# Patient Record
Sex: Female | Born: 1965 | Race: Black or African American | Hispanic: No | Marital: Married | Smoking: Never smoker
Health system: Southern US, Community
[De-identification: ages and names within clinical notes are randomized; demographics above are authoritative.]

## PROBLEM LIST (undated history)

## (undated) DIAGNOSIS — E785 Hyperlipidemia, unspecified: Secondary | ICD-10-CM

## (undated) DIAGNOSIS — I10 Essential (primary) hypertension: Secondary | ICD-10-CM

## (undated) DIAGNOSIS — M1990 Unspecified osteoarthritis, unspecified site: Secondary | ICD-10-CM

## (undated) DIAGNOSIS — L509 Urticaria, unspecified: Secondary | ICD-10-CM

## (undated) DIAGNOSIS — K5090 Crohn's disease, unspecified, without complications: Secondary | ICD-10-CM

## (undated) HISTORY — DX: Hyperlipidemia, unspecified: E78.5

## (undated) HISTORY — DX: Urticaria, unspecified: L50.9

## (undated) HISTORY — DX: Essential (primary) hypertension: I10

## (undated) HISTORY — DX: Unspecified osteoarthritis, unspecified site: M19.90

## (undated) HISTORY — PX: CARPAL TUNNEL RELEASE: SHX101

---

## 2005-01-27 HISTORY — PX: BREAST SURGERY: SHX581

## 2006-01-27 HISTORY — PX: SMALL INTESTINE SURGERY: SHX150

## 2006-01-27 HISTORY — PX: TOTAL ABDOMINAL HYSTERECTOMY: SHX209

## 2006-01-27 HISTORY — PX: ABDOMINAL HYSTERECTOMY: SHX81

## 2012-01-19 ENCOUNTER — Ambulatory Visit (INDEPENDENT_AMBULATORY_CARE_PROVIDER_SITE_OTHER): Payer: BC Managed Care – PPO | Admitting: Physician Assistant

## 2012-01-19 VITALS — BP 152/91 | HR 102 | Temp 101.0°F | Resp 18 | Ht 64.0 in | Wt 181.0 lb

## 2012-01-19 DIAGNOSIS — J101 Influenza due to other identified influenza virus with other respiratory manifestations: Secondary | ICD-10-CM

## 2012-01-19 DIAGNOSIS — J111 Influenza due to unidentified influenza virus with other respiratory manifestations: Secondary | ICD-10-CM

## 2012-01-19 DIAGNOSIS — R6889 Other general symptoms and signs: Secondary | ICD-10-CM

## 2012-01-19 LAB — POCT INFLUENZA A/B
Influenza A, POC: POSITIVE
Influenza B, POC: NEGATIVE

## 2012-01-19 MED ORDER — OSELTAMIVIR PHOSPHATE 75 MG PO CAPS: 75.0000 mg | ORAL_CAPSULE | Freq: Two times a day (BID) | ORAL | Status: DC

## 2012-01-19 MED ORDER — HYDROCODONE-HOMATROPINE 5-1.5 MG/5ML PO SYRP: 5.0000 mL | ORAL_SOLUTION | Freq: Three times a day (TID) | ORAL | Status: DC | PRN

## 2012-01-19 NOTE — Progress Notes (Signed)
  Subjective:    Patient ID: Andrea Wilkins, female    DOB: November 23, 1965, 46 y.o.   MRN: [id number]  HPI   [name] is a 46 yr old female here with flu like symptoms that began yesterday morning.  She states her whole body aches.  Has a "terrible headache", feels like her "head is about to explode".  Ears are popping.  She is coughing, has chest pain with cough.  Tmax yesterday was 103F.  Has some runny nose and sore throat as well.  Some diarrhea, no nausea or vomiting.  Appetite is down.  No sick contacts that she is aware of.  Has not had a flu shot.  Using Nyquil cold and flu for symptoms which helps a little.     Review of Systems  Constitutional: Negative for fever and chills.  HENT: Positive for ear pain, sore throat and rhinorrhea.   Respiratory: Positive for cough. Negative for shortness of breath and wheezing.   Cardiovascular: Positive for chest pain (with cough).  Gastrointestinal: Positive for diarrhea. Negative for nausea, vomiting and abdominal pain.  Musculoskeletal: Positive for myalgias and arthralgias.  Skin: Negative.   Neurological: Positive for headaches.       Objective:   Physical Exam  Vitals reviewed. Constitutional: She is oriented to person, place, and time. She appears well-developed and well-nourished. She appears ill. No distress.  HENT:  Head: Normocephalic and atraumatic.  Right Ear: Ear canal normal. Tympanic membrane is injected.  Left Ear: Ear canal normal. Tympanic membrane is injected.  Nose: Mucosal edema present. Right sinus exhibits maxillary sinus tenderness and frontal sinus tenderness. Left sinus exhibits maxillary sinus tenderness and frontal sinus tenderness.  Mouth/Throat: Uvula is midline and mucous membranes are normal. Posterior oropharyngeal erythema present. No oropharyngeal exudate or posterior oropharyngeal edema.  Eyes: Conjunctivae normal are normal. No scleral icterus.  Neck: Neck supple.  Cardiovascular: Normal rate, regular rhythm  and normal heart sounds.  Exam reveals no gallop and no friction rub.   No murmur heard. Pulmonary/Chest: Effort normal and breath sounds normal. She has no wheezes. She has no rales.  Abdominal: Soft. Bowel sounds are normal.  Lymphadenopathy:    She has no cervical adenopathy.  Neurological: She is alert and oriented to person, place, and time.  Skin: Skin is warm and dry.  Psychiatric: She has a normal mood and affect. Her behavior is normal.      Filed Vitals:   01/19/12 1404  BP: 152/91  Pulse: 102  Temp: 101 F (38.3 C)  Resp: 18       Results for orders placed in visit on 01/19/12  POCT INFLUENZA A/B      Component Value Range   Influenza A, POC Positive     Influenza B, POC Negative         Assessment & Plan:   1. Influenza A  HYDROcodone-homatropine (HYCODAN) 5-1.5 MG/5ML syrup, oseltamivir (TAMIFLU) 75 MG capsule  2. Flu-like symptoms  POCT Influenza A/B     [name] is a 46 yr old female with influenza A.  Symptoms began about 30 hours ago, will treat with Tamiflu x 5 days.  Hycodan for cough at night.  Encouraged Tylenol/Advil for fever/pain relief.  Plenty of fluids and rest.  Patient will remain out of work for the next 3 days, note given.  Pt will call or RTC if worsening or not improving.

## 2012-01-19 NOTE — Patient Instructions (Signed)
Take the Tamiflu as directed.  Be sure to finish the full course.  Use the Hycodan for cough at night if needed.  Advil or Tylenol for fever reduction/pain relief.  Plenty of fluids and rest.  Stay out of work for the next several days.     Influenza, Adult Influenza ("the flu") is a viral infection of the respiratory tract. It occurs more often in winter months because people spend more time in close contact with one another. Influenza can make you feel very sick. Influenza easily spreads from person to person (contagious). CAUSES  Influenza is caused by a virus that infects the respiratory tract. You can catch the virus by breathing in droplets from an infected person's cough or sneeze. You can also catch the virus by touching something that was recently contaminated with the virus and then touching your mouth, nose, or eyes. SYMPTOMS  Symptoms typically last 4 to 10 days and may include:  Fever.  Chills.  Headache, body aches, and muscle aches.  Sore throat.  Chest discomfort and cough.  Poor appetite.  Weakness or feeling tired.  Dizziness.  Nausea or vomiting. DIAGNOSIS  Diagnosis of influenza is often made based on your history and a physical exam. A nose or throat swab test can be done to confirm the diagnosis. RISKS AND COMPLICATIONS You may be at risk for a more severe case of influenza if you smoke cigarettes, have diabetes, have chronic heart disease (such as heart failure) or lung disease (such as asthma), or if you have a weakened immune system. Elderly people and pregnant women are also at risk for more serious infections. The most common complication of influenza is a lung infection (pneumonia). Sometimes, this complication can require emergency medical care and may be life-threatening. PREVENTION  An annual influenza vaccination (flu shot) is the best way to avoid getting influenza. An annual flu shot is now routinely recommended for all adults in the [place] TREATMENT    In mild cases, influenza goes away on its own. Treatment is directed at relieving symptoms. For more severe cases, your caregiver may prescribe antiviral medicines to shorten the sickness. Antibiotic medicines are not effective, because the infection is caused by a virus, not by bacteria. HOME CARE INSTRUCTIONS  Only take over-the-counter or prescription medicines for pain, discomfort, or fever as directed by your caregiver.  Use a cool mist humidifier to make breathing easier.  Get plenty of rest until your temperature returns to normal. This usually takes 3 to 4 days.  Drink enough fluids to keep your urine clear or pale yellow.  Cover your mouth and nose when coughing or sneezing, and wash your hands well to avoid spreading the virus.  Stay home from work or school until your fever has been gone for at least 1 full day. SEEK MEDICAL CARE IF:   You have chest pain or a deep cough that worsens or produces more mucus.  You have nausea, vomiting, or diarrhea. SEEK IMMEDIATE MEDICAL CARE IF:   You have difficulty breathing, shortness of breath, or your skin or nails turn bluish.  You have severe neck pain or stiffness.  You have a severe headache, facial pain, or earache.  You have a worsening or recurring fever.  You have nausea or vomiting that cannot be controlled. MAKE SURE YOU:  Understand these instructions.  Will watch your condition.  Will get help right away if you are not doing well or get worse. Document Released: 01/11/2000 Document Revised: 07/15/2011 Document Reviewed:  04/14/2011 ExitCare Patient Information 2013 [place].

## 2012-03-21 ENCOUNTER — Encounter (HOSPITAL_COMMUNITY): Payer: Self-pay | Admitting: Emergency Medicine

## 2012-03-21 ENCOUNTER — Emergency Department (HOSPITAL_COMMUNITY)
Admission: EM | Admit: 2012-03-21 | Discharge: 2012-03-21 | Disposition: A | Discharge status: Home / Self Care | Payer: No Typology Code available for payment source | Attending: Emergency Medicine | Admitting: Emergency Medicine

## 2012-03-21 ENCOUNTER — Emergency Department (HOSPITAL_COMMUNITY): Payer: No Typology Code available for payment source

## 2012-03-21 DIAGNOSIS — M2550 Pain in unspecified joint: Secondary | ICD-10-CM | POA: Insufficient documentation

## 2012-03-21 DIAGNOSIS — M7918 Myalgia, other site: Secondary | ICD-10-CM

## 2012-03-21 DIAGNOSIS — Y92410 Unspecified street and highway as the place of occurrence of the external cause: Secondary | ICD-10-CM | POA: Insufficient documentation

## 2012-03-21 DIAGNOSIS — IMO0001 Reserved for inherently not codable concepts without codable children: Secondary | ICD-10-CM | POA: Insufficient documentation

## 2012-03-21 DIAGNOSIS — Z8709 Personal history of other diseases of the respiratory system: Secondary | ICD-10-CM | POA: Insufficient documentation

## 2012-03-21 DIAGNOSIS — Y9389 Activity, other specified: Secondary | ICD-10-CM | POA: Insufficient documentation

## 2012-03-21 MED ORDER — CYCLOBENZAPRINE HCL 10 MG PO TABS
10.0000 mg | ORAL_TABLET | Freq: Once | ORAL | Status: AC
  Administered 2012-03-21: 10 mg via ORAL
  Filled 2012-03-21: qty 1

## 2012-03-21 MED ORDER — IBUPROFEN 800 MG PO TABS
800.0000 mg | ORAL_TABLET | Freq: Once | ORAL | Status: AC
  Administered 2012-03-21: 800 mg via ORAL
  Filled 2012-03-21: qty 1

## 2012-03-21 MED ORDER — CYCLOBENZAPRINE HCL 10 MG PO TABS: 10.0000 mg | ORAL_TABLET | Freq: Two times a day (BID) | ORAL | Status: DC | PRN
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
## 2012-03-21 NOTE — ED Provider Notes (Signed)
Medical screening examination/treatment/procedure(s) were performed by non-physician practitioner and as supervising physician I was immediately available for consultation/collaboration.   Blanchie Dessert, MD 03/21/12 1529
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
## 2012-06-14 ENCOUNTER — Encounter (HOSPITAL_COMMUNITY): Payer: Self-pay | Admitting: *Deleted

## 2012-06-14 ENCOUNTER — Encounter: Payer: Self-pay | Admitting: *Deleted

## 2012-06-14 ENCOUNTER — Emergency Department (HOSPITAL_COMMUNITY)
Admission: EM | Admit: 2012-06-14 | Discharge: 2012-06-14 | Disposition: A | Discharge status: Home / Self Care | Payer: Self-pay | Attending: Emergency Medicine | Admitting: Emergency Medicine

## 2012-06-14 DIAGNOSIS — G44209 Tension-type headache, unspecified, not intractable: Secondary | ICD-10-CM | POA: Insufficient documentation

## 2012-06-14 DIAGNOSIS — F411 Generalized anxiety disorder: Secondary | ICD-10-CM | POA: Insufficient documentation

## 2012-06-14 DIAGNOSIS — Z79899 Other long term (current) drug therapy: Secondary | ICD-10-CM | POA: Insufficient documentation

## 2012-06-14 DIAGNOSIS — I10 Essential (primary) hypertension: Secondary | ICD-10-CM | POA: Insufficient documentation

## 2012-06-14 DIAGNOSIS — J3489 Other specified disorders of nose and nasal sinuses: Secondary | ICD-10-CM | POA: Insufficient documentation

## 2012-06-14 DIAGNOSIS — H53149 Visual discomfort, unspecified: Secondary | ICD-10-CM | POA: Insufficient documentation

## 2012-06-14 DIAGNOSIS — H6690 Otitis media, unspecified, unspecified ear: Secondary | ICD-10-CM | POA: Insufficient documentation

## 2012-06-14 DIAGNOSIS — J45909 Unspecified asthma, uncomplicated: Secondary | ICD-10-CM | POA: Insufficient documentation

## 2012-06-14 DIAGNOSIS — G479 Sleep disorder, unspecified: Secondary | ICD-10-CM | POA: Insufficient documentation

## 2012-06-14 DIAGNOSIS — R5381 Other malaise: Secondary | ICD-10-CM | POA: Insufficient documentation

## 2012-06-14 MED ORDER — HYDROCHLOROTHIAZIDE 12.5 MG PO CAPS: 12.5000 mg | ORAL_CAPSULE | ORAL | Status: DC

## 2012-06-14 MED ORDER — IBUPROFEN 800 MG PO TABS
800.0000 mg | ORAL_TABLET | Freq: Once | ORAL | Status: AC
  Administered 2012-06-14: 800 mg via ORAL
  Filled 2012-06-14: qty 1

## 2012-06-14 MED ORDER — ONDANSETRON HCL 4 MG PO TABS
4.0000 mg | ORAL_TABLET | Freq: Once | ORAL | Status: AC
  Administered 2012-06-14: 4 mg via ORAL
  Filled 2012-06-14: qty 1

## 2012-06-14 MED ORDER — BACLOFEN 10 MG PO TABS: 10.0000 mg | ORAL_TABLET | Freq: Once | ORAL | Status: DC

## 2012-06-14 MED ORDER — BACLOFEN 10 MG PO TABS
10.0000 mg | ORAL_TABLET | Freq: Once | ORAL | Status: AC
  Administered 2012-06-14: 10 mg via ORAL
  Filled 2012-06-14: qty 1

## 2012-06-14 MED ORDER — AMOXICILLIN 500 MG PO CAPS: 500.0000 mg | ORAL_CAPSULE | Freq: Two times a day (BID) | ORAL | Status: DC

## 2012-06-14 MED ORDER — ONDANSETRON HCL 4 MG PO TABS: 4.0000 mg | ORAL_TABLET | Freq: Once | ORAL | Status: DC

## 2012-06-14 NOTE — Telephone Encounter (Signed)
Requested Prescriptions   Pending Prescriptions Disp Refills  . baclofen (LIORESAL) 10 MG tablet 30 each 0    Sig: Take 1 tablet (10 mg total) by mouth once.   Please clarify directions "by mouth once......" Tildon Husky, RN-BSN

## 2012-06-14 NOTE — ED Notes (Signed)
Pt c/o headache x 1 wk; c/o elevated blood pressure; does not take medication for bp; bp reading done at home 164/110; 159/110

## 2012-06-14 NOTE — ED Provider Notes (Signed)
History    CSN: [id number] Arrival date & time 06/14/12  0710  First MD Initiated Contact with Patient 06/14/12 0725    Chief Complaint  Patient presents with  . Headache   HPI Comments: Patient is a 47 year old female who presents with a two-week headache is described as diffuse and  otherwise nonspecific.  Questionable bilateral throbbing, worse with sleep and bright lights.  Reports facial pressure with no congestion, rhinorrhea, hearing changes or balance issues.  Questionable chills but no overt fevers.  No overt vision changes but does report having to "refocused her eyes" especially working on the computer.  Denies UE weakness, numbness or tingling.  She reports work has been increasingly stressful as her [employer] has just been bought out and she is unsure of her future job security.    Pt also has noticed that her blood pressure has been up at home.  Her blood pressures has been as high as 160/110 at home.  She does have a history of hypertension but has not followed up with a physician in the past 4 years since moving to [place].  She denies any chest pain, shortness breath, orthopnea, lower extremity swelling.     Past Medical History  Diagnosis Date  . Asthma     Past Surgical History  Procedure Laterality Date  . Breast surgery    . Cesarean section    . Small intestine surgery    . Abdominal hysterectomy      Family History  Problem Relation Age of Onset  . Hypertension Mother   . Diabetes Sister   . Hypertension Sister     History  Substance Use Topics  . Smoking status: Never Smoker   . Smokeless tobacco: Not on file  . Alcohol Use: Not on file    OB History   Grav Para Term Preterm Abortions TAB SAB Ect Mult Living                  Review of Systems  Constitutional: Positive for chills and fatigue. Negative for fever, diaphoresis, activity change, appetite change and unexpected weight change.  HENT: Positive for congestion and sinus pressure.  Negative for hearing loss, ear pain, facial swelling, rhinorrhea, neck pain, neck stiffness, postnasal drip, tinnitus and ear discharge.   Eyes: Positive for photophobia (slight) and visual disturbance (difficulty focusing while working on computer). Negative for itching.  Respiratory: Negative for apnea, cough, chest tightness, shortness of breath and wheezing.   Cardiovascular: Negative for chest pain, palpitations and leg swelling.  Gastrointestinal: Negative.  Negative for abdominal pain and abdominal distention.  Endocrine: Positive for cold intolerance. Negative for polydipsia, polyphagia and polyuria.  Genitourinary: Negative for frequency, hematuria and menstrual problem.  Musculoskeletal: Negative for myalgias, back pain and joint swelling.  Skin: Negative.   Allergic/Immunologic: Negative.   Neurological: Positive for headaches. Negative for dizziness, seizures, syncope, facial asymmetry, speech difficulty, weakness, light-headedness and numbness.  Hematological: Negative.   Psychiatric/Behavioral: Positive for sleep disturbance. Negative for confusion, decreased concentration and agitation. The patient is nervous/anxious.     Allergies  Review of patient's allergies indicates no known allergies.  Home Medications   Current Outpatient Rx  Name  Route  Sig  Dispense  Refill  . amoxicillin (AMOXIL) 500 MG capsule   Oral   Take 1 capsule (500 mg total) by mouth 2 (two) times daily.   20 capsule   0     Dispense as written.   . baclofen (LIORESAL) 10 MG  tablet   Oral   Take 1 tablet (10 mg total) by mouth once.   30 each   0   . hydrochlorothiazide (MICROZIDE) 12.5 MG capsule   Oral   Take 1 capsule (12.5 mg total) by mouth every morning.   30 capsule   0   . ondansetron (ZOFRAN) 4 MG tablet   Oral   Take 1 tablet (4 mg total) by mouth once.   20 tablet   0     BP 158/94  Pulse 70  Temp(Src) 98.1 F (36.7 C) (Oral)  Resp 16  SpO2 100%  Physical Exam    Nursing note and vitals reviewed. Constitutional: She appears well-developed and well-nourished. No distress.  HENT:  Head: Normocephalic and atraumatic. No trismus in the jaw.  Ears:  Nose: No mucosal edema or rhinorrhea. Right sinus exhibits no maxillary sinus tenderness and no frontal sinus tenderness. Left sinus exhibits no maxillary sinus tenderness and no frontal sinus tenderness.  Mouth/Throat: Uvula is midline and mucous membranes are normal. No dental abscesses or edematous. No oropharyngeal exudate, posterior oropharyngeal edema or posterior oropharyngeal erythema.  B purulent effusions  Eyes: Conjunctivae are normal. Right eye exhibits no discharge. Left eye exhibits no discharge. No scleral icterus.  Neck: Normal range of motion. Neck supple. No JVD present. Muscular tenderness (B paraspinal muscular spasm.  TTP over Left greater occipital nerve) present. No tracheal deviation present. No Brudzinski's sign and no Kernig's sign noted. No mass and no thyromegaly present.  Cardiovascular: Normal rate, regular rhythm, normal heart sounds and intact distal pulses.  Exam reveals no gallop and no friction rub.   No murmur heard. Pulmonary/Chest: Effort normal and breath sounds normal. No respiratory distress. She has no wheezes. She has no rales.  Abdominal: Soft.  Musculoskeletal: Normal range of motion. She exhibits no edema.  Neurological: She is alert. She has normal strength. She exhibits normal muscle tone.  Strength 5+/5 in UE myotomes.   UE sensation grossly intact  Skin: Skin is warm and dry. No rash noted. She is not diaphoretic. No erythema. No pallor.  Psychiatric: She has a normal mood and affect. Her behavior is normal. Judgment and thought content normal.    ED Course  Procedures (including critical care time)  Labs Reviewed - No data to display No results found.   1. Otitis media, bilateral   2. Hypertension   3. Tension headache      MDM  Pt with B  purulent otitis media.  Will plan to tx with Amoxicillin x10 days.  Given MSK findings headache most consistent with tension type headache will try Baclofen as Flexeril has not seemed to help in the past.  Most likely secondary to prolonged work on computer and viscerosomatic rx to Otitis.    Headache not consistent with concerning intracranial process or classic migraine (2 weeks constant).  Will start HCTZ for BP control and recommend to establish with PCP for ongoing care and health maintenance.  Gerda Diss, DO 06/14/12 [phone]

## 2012-06-14 NOTE — ED Provider Notes (Signed)
I saw and evaluated the patient, reviewed the resident's note and I agree with the findings and plan.   .Face to face Exam:  General:  Awake HEENT:  Atraumatic Resp:  Normal effort Abd:  Nondistended Neuro:No focal weakness Lymph: No adenopathy   Dot Lanes, MD 06/14/12 [phone]

## 2012-07-11 ENCOUNTER — Other Ambulatory Visit: Payer: Self-pay | Admitting: Sports Medicine

## 2012-09-21 ENCOUNTER — Encounter (HOSPITAL_COMMUNITY): Payer: Self-pay | Admitting: Emergency Medicine

## 2012-09-21 ENCOUNTER — Emergency Department (HOSPITAL_COMMUNITY): Payer: Self-pay

## 2012-09-21 ENCOUNTER — Emergency Department (HOSPITAL_COMMUNITY)
Admission: EM | Admit: 2012-09-21 | Discharge: 2012-09-21 | Disposition: A | Discharge status: Home / Self Care | Payer: Self-pay | Attending: Emergency Medicine | Admitting: Emergency Medicine

## 2012-09-21 DIAGNOSIS — R7309 Other abnormal glucose: Secondary | ICD-10-CM | POA: Insufficient documentation

## 2012-09-21 DIAGNOSIS — R1013 Epigastric pain: Secondary | ICD-10-CM | POA: Insufficient documentation

## 2012-09-21 DIAGNOSIS — Z79899 Other long term (current) drug therapy: Secondary | ICD-10-CM | POA: Insufficient documentation

## 2012-09-21 DIAGNOSIS — R739 Hyperglycemia, unspecified: Secondary | ICD-10-CM

## 2012-09-21 DIAGNOSIS — R197 Diarrhea, unspecified: Secondary | ICD-10-CM | POA: Insufficient documentation

## 2012-09-21 DIAGNOSIS — K5090 Crohn's disease, unspecified, without complications: Secondary | ICD-10-CM | POA: Insufficient documentation

## 2012-09-21 DIAGNOSIS — J45909 Unspecified asthma, uncomplicated: Secondary | ICD-10-CM | POA: Insufficient documentation

## 2012-09-21 LAB — CBC WITH DIFFERENTIAL/PLATELET
Basophils Absolute: 0 10*3/uL (ref 0.0–0.1)
Basophils Relative: 0 % (ref 0–1)
Eosinophils Absolute: 0 10*3/uL (ref 0.0–0.7)
Eosinophils Relative: 0 % (ref 0–5)
HCT: 38.9 % (ref 36.0–46.0)
Hemoglobin: 13.1 g/dL (ref 12.0–15.0)
Lymphocytes Relative: 12 % (ref 12–46)
Lymphs Abs: 0.8 10*3/uL (ref 0.7–4.0)
MCH: 29.6 pg (ref 26.0–34.0)
MCHC: 33.7 g/dL (ref 30.0–36.0)
MCV: 88 fL (ref 78.0–100.0)
Monocytes Absolute: 0.2 10*3/uL (ref 0.1–1.0)
Monocytes Relative: 3 % (ref 3–12)
Neutro Abs: 5.7 10*3/uL (ref 1.7–7.7)
Neutrophils Relative %: 85 % — ABNORMAL HIGH (ref 43–77)
Platelets: 205 10*3/uL (ref 150–400)
RBC: 4.42 MIL/uL (ref 3.87–5.11)
RDW: 12.7 % (ref 11.5–15.5)
WBC: 6.7 10*3/uL (ref 4.0–10.5)

## 2012-09-21 LAB — COMPREHENSIVE METABOLIC PANEL
ALT: 10 U/L (ref 0–35)
AST: 15 U/L (ref 0–37)
Albumin: 4.1 g/dL (ref 3.5–5.2)
Alkaline Phosphatase: 69 U/L (ref 39–117)
BUN: 10 mg/dL (ref 6–23)
CO2: 30 mEq/L (ref 19–32)
Calcium: 9.4 mg/dL (ref 8.4–10.5)
Chloride: 99 mEq/L (ref 96–112)
Creatinine, Ser: 0.85 mg/dL (ref 0.50–1.10)
GFR calc Af Amer: >90 mL/min (ref >90)
GFR calc non Af Amer: 82 mL/min — ABNORMAL LOW (ref >90)
Glucose, Bld: 136 mg/dL — ABNORMAL HIGH (ref 70–99)
Potassium: 4.1 mEq/L (ref 3.5–5.1)
Sodium: 137 mEq/L (ref 135–145)
Total Bilirubin: 0.3 mg/dL (ref 0.3–1.2)
Total Protein: 7.6 g/dL (ref 6.0–8.3)

## 2012-09-21 LAB — URINALYSIS, ROUTINE W REFLEX MICROSCOPIC
Bilirubin Urine: NEGATIVE
Glucose, UA: NEGATIVE mg/dL
Hgb urine dipstick: NEGATIVE
Ketones, ur: NEGATIVE mg/dL
Leukocytes, UA: NEGATIVE
Nitrite: NEGATIVE
Protein, ur: NEGATIVE mg/dL
Specific Gravity, Urine: 1.022 (ref 1.005–1.030)
Urobilinogen, UA: 1 mg/dL (ref 0.0–1.0)
pH: 8 (ref 5.0–8.0)

## 2012-09-21 LAB — LIPASE, BLOOD: Lipase: 19 U/L (ref 11–59)

## 2012-09-21 LAB — URINE MICROSCOPIC-ADD ON

## 2012-09-21 MED ORDER — HYDROMORPHONE HCL PF 1 MG/ML IJ SOLN
1.0000 mg | Freq: Once | INTRAMUSCULAR | Status: AC
Start: 2012-09-21 — End: 2012-09-21
  Administered 2012-09-21: 1 mg via INTRAVENOUS
  Filled 2012-09-21: qty 1

## 2012-09-21 MED ORDER — PREDNISONE 20 MG PO TABS: ORAL_TABLET | ORAL | Status: DC

## 2012-09-21 MED ORDER — METHYLPREDNISOLONE SODIUM SUCC 125 MG IJ SOLR
125.0000 mg | Freq: Once | INTRAMUSCULAR | Status: AC
  Administered 2012-09-21: 125 mg via INTRAVENOUS
  Filled 2012-09-21: qty 2

## 2012-09-21 MED ORDER — DICYCLOMINE HCL 20 MG PO TABS: 20.0000 mg | ORAL_TABLET | Freq: Two times a day (BID) | ORAL | Status: DC

## 2012-09-21 MED ORDER — ONDANSETRON HCL 4 MG PO TABS: 4.0000 mg | ORAL_TABLET | Freq: Three times a day (TID) | ORAL | Status: DC | PRN

## 2012-09-21 MED ORDER — SODIUM CHLORIDE 0.9 % IV BOLUS (SEPSIS)
1000.0000 mL | Freq: Once | INTRAVENOUS | Status: AC
  Administered 2012-09-21: 1000 mL via INTRAVENOUS

## 2012-09-21 MED ORDER — GI COCKTAIL ~~LOC~~
30.0000 mL | Freq: Once | ORAL | Status: AC
  Administered 2012-09-21: 30 mL via ORAL
  Filled 2012-09-21: qty 30

## 2012-09-21 MED ORDER — IOHEXOL 300 MG/ML  SOLN
50.0000 mL | Freq: Once | INTRAMUSCULAR | Status: AC | PRN
  Administered 2012-09-21: 50 mL via ORAL

## 2012-09-21 MED ORDER — ONDANSETRON HCL 4 MG/2ML IJ SOLN
4.0000 mg | Freq: Once | INTRAMUSCULAR | Status: AC
  Administered 2012-09-21: 4 mg via INTRAVENOUS
  Filled 2012-09-21: qty 2

## 2012-09-21 MED ORDER — IOHEXOL 300 MG/ML  SOLN
100.0000 mL | Freq: Once | INTRAMUSCULAR | Status: AC | PRN
  Administered 2012-09-21: 100 mL via INTRAVENOUS

## 2012-09-21 MED ORDER — MORPHINE SULFATE 4 MG/ML IJ SOLN
4.0000 mg | Freq: Once | INTRAMUSCULAR | Status: AC
  Administered 2012-09-21: 4 mg via INTRAVENOUS
  Filled 2012-09-21: qty 1

## 2012-09-21 MED ORDER — HYDROMORPHONE HCL PF 1 MG/ML IJ SOLN
1.0000 mg | Freq: Once | INTRAMUSCULAR | Status: AC
  Administered 2012-09-21: 1 mg via INTRAVENOUS
  Filled 2012-09-21: qty 1

## 2012-09-21 MED ORDER — HYDROCODONE-ACETAMINOPHEN 5-325 MG PO TABS: 1.0000 | ORAL_TABLET | Freq: Four times a day (QID) | ORAL | Status: DC | PRN

## 2012-09-21 NOTE — ED Notes (Signed)
Pt presents to the ED with a complaint of emesis.  Pt states that she started having emesis at around 1900 hrs. 09/20/2012.  Pt states she has had 4 episodes of emesis.  Pt has abdominal pain as well.

## 2012-09-21 NOTE — ED Provider Notes (Signed)
CSN: [id number]     Arrival date & time 09/21/12  0305 History   First MD Initiated Contact with Patient 09/21/12 0353     Chief Complaint  Patient presents with  . Emesis   (Consider location/radiation/quality/duration/timing/severity/associated sxs/prior Treatment) HPI Comments: Patient is a 47 year old female with history of Crohn's disease and bowel reconstruction who presents after sudden onset of burning epigastric pain that began at 7 PM last night. It is associated with nausea, vomiting, diarrhea. She does not believe that her emesis or diarrhea is bloody, but states that she hasn't really been looking. Last normal bowel movement was yesterday. She did not take any medications for her Crohn's, but states it has not flared up since she was diagnosed in 2008. Yesterday she ate salmon, gradients, rice for dinner. She has had no food out of the ordinary. No recent trips out of the country or camping trips. No one else is sick.  The history is provided by the patient. No language interpreter was used.    Past Medical History  Diagnosis Date  . Asthma    Past Surgical History  Procedure Laterality Date  . Breast surgery    . Cesarean section    . Small intestine surgery    . Abdominal hysterectomy     Family History  Problem Relation Age of Onset  . Hypertension Mother   . Diabetes Sister   . Hypertension Sister    History  Substance Use Topics  . Smoking status: Never Smoker   . Smokeless tobacco: Not on file  . Alcohol Use: Not on file   OB History   Grav Para Term Preterm Abortions TAB SAB Ect Mult Living                 Review of Systems  Constitutional: Negative for fever and chills.  Respiratory: Negative for shortness of breath.   Cardiovascular: Negative for chest pain.  Gastrointestinal: Positive for nausea, vomiting, abdominal pain and diarrhea. Negative for constipation.  Skin: Negative for rash.  All other systems reviewed and are  negative.    Allergies  Review of patient's allergies indicates no known allergies.  Home Medications   Current Outpatient Rx  Name  Route  Sig  Dispense  Refill  . lisinopril-hydrochlorothiazide (PRINZIDE,ZESTORETIC) 20-12.5 MG per tablet   Oral   Take 1 tablet by mouth daily.          BP 156/96  Pulse 69  Temp(Src) 98.5 F (36.9 C) (Oral)  Resp 18  SpO2 99% Physical Exam  Nursing note and vitals reviewed. Constitutional: She is oriented to person, place, and time. She appears well-developed and well-nourished. No distress.  HENT:  Head: Normocephalic and atraumatic.  Right Ear: External ear normal.  Left Ear: External ear normal.  Nose: Nose normal.  Mouth/Throat: Oropharynx is clear and moist.  Eyes: Conjunctivae are normal.  Neck: Normal range of motion.  Cardiovascular: Normal rate, regular rhythm and normal heart sounds.   Pulmonary/Chest: Effort normal and breath sounds normal. No stridor. No respiratory distress. She has no wheezes. She has no rales.  Abdominal: Soft. She exhibits no distension. There is generalized tenderness. There is no rigidity, no rebound and no guarding.  Maximal tenderness in epigastric area  Musculoskeletal: Normal range of motion.  Neurological: She is alert and oriented to person, place, and time. She has normal strength.  Skin: Skin is warm and dry. She is not diaphoretic. No erythema.  Psychiatric: She has a normal mood and affect.  Her behavior is normal.    ED Course  Procedures (including critical care time) Labs Review Labs Reviewed  CBC WITH DIFFERENTIAL - Abnormal; Notable for the following:    Neutrophils Relative % 85 (*)    All other components within normal limits  COMPREHENSIVE METABOLIC PANEL - Abnormal; Notable for the following:    Glucose, Bld 136 (*)    GFR calc non Af Amer 82 (*)    All other components within normal limits  URINALYSIS, ROUTINE W REFLEX MICROSCOPIC - Abnormal; Notable for the following:     APPearance TURBID (*)    All other components within normal limits  URINE MICROSCOPIC-ADD ON - Abnormal; Notable for the following:    Squamous Epithelial / LPF FEW (*)    Bacteria, UA MANY (*)    All other components within normal limits  LIPASE, BLOOD   Imaging Review No results found.  MDM  No diagnosis found.  Patient with n/v/d and hx of crohn's disease. Labs unremarkable. Patient's pain has improved with bolus, morphine, gi cocktail, and zofran. Currently CT pending due to hx of crohn's dz. If CT is WNL PO challenge and send home. If CT with significant findings tx accordingly. Follow up with GI. Patient signed out to [place], PA-C at change of shift. Vital signs stable. Abdomen soft and nonsurgical. Discussed this case with Dr. Roxanne Mins who agrees with plan.     Elwyn Lade, PA-C 09/21/12 [phone]

## 2012-09-21 NOTE — Progress Notes (Signed)
[facility] provided patient with a list of primary care resources in [facility] and a Scientist, product/process development.

## 2012-09-21 NOTE — ED Provider Notes (Signed)
Medical screening examination/treatment/procedure(s) were performed by non-physician practitioner and as supervising physician I was immediately available for consultation/collaboration.   Delora Fuel, MD 06/58/26 0888

## 2012-10-23 ENCOUNTER — Inpatient Hospital Stay (HOSPITAL_COMMUNITY)
Admission: EM | Admit: 2012-10-23 | Discharge: 2012-10-27 | DRG: 387 | Disposition: A | Discharge status: Home / Self Care | Payer: Self-pay | Attending: Family Medicine | Admitting: Family Medicine

## 2012-10-23 ENCOUNTER — Emergency Department (HOSPITAL_COMMUNITY): Payer: Self-pay

## 2012-10-23 ENCOUNTER — Encounter (HOSPITAL_COMMUNITY): Payer: Self-pay

## 2012-10-23 DIAGNOSIS — Z90710 Acquired absence of both cervix and uterus: Secondary | ICD-10-CM

## 2012-10-23 DIAGNOSIS — I10 Essential (primary) hypertension: Secondary | ICD-10-CM | POA: Diagnosis present

## 2012-10-23 DIAGNOSIS — Z79899 Other long term (current) drug therapy: Secondary | ICD-10-CM

## 2012-10-23 DIAGNOSIS — K5000 Crohn's disease of small intestine without complications: Principal | ICD-10-CM | POA: Diagnosis present

## 2012-10-23 DIAGNOSIS — R112 Nausea with vomiting, unspecified: Secondary | ICD-10-CM

## 2012-10-23 DIAGNOSIS — J45909 Unspecified asthma, uncomplicated: Secondary | ICD-10-CM | POA: Diagnosis present

## 2012-10-23 DIAGNOSIS — R109 Unspecified abdominal pain: Secondary | ICD-10-CM

## 2012-10-23 DIAGNOSIS — K5090 Crohn's disease, unspecified, without complications: Secondary | ICD-10-CM

## 2012-10-23 HISTORY — DX: Unspecified asthma, uncomplicated: J45.909

## 2012-10-23 HISTORY — DX: Crohn's disease, unspecified, without complications: K50.90

## 2012-10-23 LAB — C-REACTIVE PROTEIN: CRP: 1.1 mg/dL — ABNORMAL HIGH (ref <0.60)

## 2012-10-23 LAB — COMPREHENSIVE METABOLIC PANEL
ALT: 8 U/L (ref 0–35)
AST: 13 U/L (ref 0–37)
Albumin: 4 g/dL (ref 3.5–5.2)
Alkaline Phosphatase: 60 U/L (ref 39–117)
BUN: 9 mg/dL (ref 6–23)
CO2: 27 mEq/L (ref 19–32)
Calcium: 9.4 mg/dL (ref 8.4–10.5)
Chloride: 99 mEq/L (ref 96–112)
Creatinine, Ser: 0.73 mg/dL (ref 0.50–1.10)
GFR calc Af Amer: >90 mL/min (ref >90)
GFR calc non Af Amer: >90 mL/min (ref >90)
Glucose, Bld: 114 mg/dL — ABNORMAL HIGH (ref 70–99)
Potassium: 4 mEq/L (ref 3.5–5.1)
Sodium: 137 mEq/L (ref 135–145)
Total Bilirubin: 0.4 mg/dL (ref 0.3–1.2)
Total Protein: 7.4 g/dL (ref 6.0–8.3)

## 2012-10-23 LAB — CBC WITH DIFFERENTIAL/PLATELET
Basophils Absolute: 0 10*3/uL (ref 0.0–0.1)
Basophils Relative: 0 % (ref 0–1)
Eosinophils Absolute: 0 10*3/uL (ref 0.0–0.7)
Eosinophils Relative: 1 % (ref 0–5)
HCT: 37.9 % (ref 36.0–46.0)
Hemoglobin: 12.8 g/dL (ref 12.0–15.0)
Lymphocytes Relative: 17 % (ref 12–46)
Lymphs Abs: 0.9 10*3/uL (ref 0.7–4.0)
MCH: 29.4 pg (ref 26.0–34.0)
MCHC: 33.8 g/dL (ref 30.0–36.0)
MCV: 87.1 fL (ref 78.0–100.0)
Monocytes Absolute: 0.3 10*3/uL (ref 0.1–1.0)
Monocytes Relative: 5 % (ref 3–12)
Neutro Abs: 4 10*3/uL (ref 1.7–7.7)
Neutrophils Relative %: 77 % (ref 43–77)
Platelets: 215 10*3/uL (ref 150–400)
RBC: 4.35 MIL/uL (ref 3.87–5.11)
RDW: 12.8 % (ref 11.5–15.5)
WBC: 5.2 10*3/uL (ref 4.0–10.5)

## 2012-10-23 LAB — URINALYSIS, ROUTINE W REFLEX MICROSCOPIC
Bilirubin Urine: NEGATIVE
Glucose, UA: NEGATIVE mg/dL
Hgb urine dipstick: NEGATIVE
Ketones, ur: NEGATIVE mg/dL
Leukocytes, UA: NEGATIVE
Nitrite: NEGATIVE
Protein, ur: 30 mg/dL — AB
Specific Gravity, Urine: 1.023 (ref 1.005–1.030)
Urobilinogen, UA: 0.2 mg/dL (ref 0.0–1.0)
pH: 8 (ref 5.0–8.0)

## 2012-10-23 LAB — PROTIME-INR
INR: 0.95 (ref 0.00–1.49)
Prothrombin Time: 12.5 seconds (ref 11.6–15.2)

## 2012-10-23 LAB — CBC
HCT: 35.1 % — ABNORMAL LOW (ref 36.0–46.0)
Hemoglobin: 11.8 g/dL — ABNORMAL LOW (ref 12.0–15.0)
MCH: 29.4 pg (ref 26.0–34.0)
MCHC: 33.6 g/dL (ref 30.0–36.0)
MCV: 87.3 fL (ref 78.0–100.0)
Platelets: 209 10*3/uL (ref 150–400)
RBC: 4.02 MIL/uL (ref 3.87–5.11)
RDW: 12.8 % (ref 11.5–15.5)
WBC: 4.6 10*3/uL (ref 4.0–10.5)

## 2012-10-23 LAB — FOLATE: Folate: 12.5 ng/mL

## 2012-10-23 LAB — CREATININE, SERUM
Creatinine, Ser: 0.81 mg/dL (ref 0.50–1.10)
GFR calc Af Amer: >90 mL/min (ref >90)
GFR calc non Af Amer: 86 mL/min — ABNORMAL LOW (ref >90)

## 2012-10-23 LAB — VITAMIN B12: Vitamin B-12: 348 pg/mL (ref 211–911)

## 2012-10-23 LAB — IRON AND TIBC
Iron: 46 ug/dL (ref 42–135)
Saturation Ratios: 17 % — ABNORMAL LOW (ref 20–55)
TIBC: 277 ug/dL (ref 250–470)
UIBC: 231 ug/dL (ref 125–400)

## 2012-10-23 LAB — URINE MICROSCOPIC-ADD ON

## 2012-10-23 LAB — APTT: aPTT: 28 seconds (ref 24–37)

## 2012-10-23 LAB — MAGNESIUM: Magnesium: 1.9 mg/dL (ref 1.5–2.5)

## 2012-10-23 LAB — PREGNANCY, URINE: Preg Test, Ur: NEGATIVE

## 2012-10-23 LAB — LIPASE, BLOOD: Lipase: 16 U/L (ref 11–59)

## 2012-10-23 LAB — FERRITIN: Ferritin: 89 ng/mL (ref 10–291)

## 2012-10-23 LAB — SEDIMENTATION RATE: Sed Rate: 29 mm/hr — ABNORMAL HIGH (ref 0–22)

## 2012-10-23 MED ORDER — ALUM & MAG HYDROXIDE-SIMETH 200-200-20 MG/5ML PO SUSP: 30.0000 mL | Freq: Four times a day (QID) | ORAL | Status: DC | PRN

## 2012-10-23 MED ORDER — SODIUM CHLORIDE 0.9 % IV SOLN
Freq: Once | INTRAVENOUS | Status: AC
  Administered 2012-10-23: 09:00:00 via INTRAVENOUS

## 2012-10-23 MED ORDER — IOHEXOL 300 MG/ML  SOLN
100.0000 mL | Freq: Once | INTRAMUSCULAR | Status: AC | PRN
  Administered 2012-10-23: 100 mL via INTRAVENOUS

## 2012-10-23 MED ORDER — ACETAMINOPHEN 650 MG RE SUPP: 650.0000 mg | Freq: Four times a day (QID) | RECTAL | Status: DC | PRN

## 2012-10-23 MED ORDER — GI COCKTAIL ~~LOC~~
30.0000 mL | Freq: Three times a day (TID) | ORAL | Status: DC | PRN
  Filled 2012-10-23: qty 30

## 2012-10-23 MED ORDER — METHYLPREDNISOLONE SODIUM SUCC 125 MG IJ SOLR
60.0000 mg | Freq: Two times a day (BID) | INTRAMUSCULAR | Status: DC
  Administered 2012-10-23 (×2): 60 mg via INTRAVENOUS
  Administered 2012-10-24: 62.5 mg via INTRAVENOUS
  Administered 2012-10-24 – 2012-10-26 (×5): 60 mg via INTRAVENOUS
  Filled 2012-10-23 (×10): qty 0.96

## 2012-10-23 MED ORDER — MAGNESIUM CITRATE PO SOLN: 1.0000 | Freq: Once | ORAL | Status: AC | PRN

## 2012-10-23 MED ORDER — ONDANSETRON HCL 4 MG/2ML IJ SOLN
4.0000 mg | Freq: Once | INTRAMUSCULAR | Status: AC
  Administered 2012-10-23: 4 mg via INTRAVENOUS
  Filled 2012-10-23: qty 2

## 2012-10-23 MED ORDER — POLYETHYLENE GLYCOL 3350 17 G PO PACK
17.0000 g | PACK | Freq: Every day | ORAL | Status: DC | PRN
  Filled 2012-10-23: qty 1

## 2012-10-23 MED ORDER — ENOXAPARIN SODIUM 40 MG/0.4ML ~~LOC~~ SOLN
40.0000 mg | SUBCUTANEOUS | Status: DC
  Administered 2012-10-23 – 2012-10-26 (×4): 40 mg via SUBCUTANEOUS
  Filled 2012-10-23 (×5): qty 0.4

## 2012-10-23 MED ORDER — ACETAMINOPHEN 325 MG PO TABS
650.0000 mg | ORAL_TABLET | Freq: Four times a day (QID) | ORAL | Status: DC | PRN
  Administered 2012-10-26 – 2012-10-27 (×3): 650 mg via ORAL
  Filled 2012-10-23 (×3): qty 2

## 2012-10-23 MED ORDER — SORBITOL 70 % SOLN
30.0000 mL | Freq: Every day | Status: DC | PRN
  Filled 2012-10-23: qty 30

## 2012-10-23 MED ORDER — OXYCODONE HCL 5 MG PO TABS
5.0000 mg | ORAL_TABLET | ORAL | Status: DC | PRN
  Administered 2012-10-23 – 2012-10-25 (×8): 5 mg via ORAL
  Filled 2012-10-23 (×9): qty 1

## 2012-10-23 MED ORDER — SODIUM CHLORIDE 0.9 % IV SOLN
INTRAVENOUS | Status: AC
  Administered 2012-10-23 – 2012-10-25 (×5): via INTRAVENOUS

## 2012-10-23 MED ORDER — ALBUTEROL SULFATE (5 MG/ML) 0.5% IN NEBU: 2.5000 mg | INHALATION_SOLUTION | RESPIRATORY_TRACT | Status: DC | PRN

## 2012-10-23 MED ORDER — PANTOPRAZOLE SODIUM 40 MG PO TBEC
40.0000 mg | DELAYED_RELEASE_TABLET | Freq: Every day | ORAL | Status: DC
  Administered 2012-10-24 – 2012-10-27 (×4): 40 mg via ORAL
  Filled 2012-10-23 (×5): qty 1

## 2012-10-23 MED ORDER — GI COCKTAIL ~~LOC~~
30.0000 mL | Freq: Once | ORAL | Status: AC
  Administered 2012-10-23: 30 mL via ORAL
  Filled 2012-10-23: qty 30

## 2012-10-23 MED ORDER — MORPHINE SULFATE 4 MG/ML IJ SOLN
4.0000 mg | Freq: Once | INTRAMUSCULAR | Status: AC
  Administered 2012-10-23: 4 mg via INTRAVENOUS
  Filled 2012-10-23: qty 1

## 2012-10-23 MED ORDER — LISINOPRIL 20 MG PO TABS
20.0000 mg | ORAL_TABLET | Freq: Every day | ORAL | Status: DC
  Administered 2012-10-23 – 2012-10-26 (×4): 20 mg via ORAL
  Filled 2012-10-23 (×5): qty 1

## 2012-10-23 MED ORDER — DOCUSATE SODIUM 100 MG PO CAPS
100.0000 mg | ORAL_CAPSULE | Freq: Two times a day (BID) | ORAL | Status: DC
  Administered 2012-10-23 – 2012-10-24 (×3): 100 mg via ORAL
  Filled 2012-10-23 (×4): qty 1

## 2012-10-23 MED ORDER — IOHEXOL 300 MG/ML  SOLN
50.0000 mL | Freq: Once | INTRAMUSCULAR | Status: AC | PRN
  Administered 2012-10-23: 50 mL via ORAL

## 2012-10-23 MED ORDER — ONDANSETRON HCL 4 MG/2ML IJ SOLN
4.0000 mg | Freq: Four times a day (QID) | INTRAMUSCULAR | Status: DC | PRN
  Administered 2012-10-24 – 2012-10-26 (×3): 4 mg via INTRAVENOUS
  Filled 2012-10-23 (×3): qty 2

## 2012-10-23 MED ORDER — ONDANSETRON HCL 4 MG PO TABS
4.0000 mg | ORAL_TABLET | Freq: Four times a day (QID) | ORAL | Status: DC | PRN
  Filled 2012-10-23: qty 1

## 2012-10-23 MED ORDER — MORPHINE SULFATE 4 MG/ML IJ SOLN
4.0000 mg | INTRAMUSCULAR | Status: DC | PRN
  Administered 2012-10-23 – 2012-10-25 (×11): 4 mg via INTRAVENOUS
  Filled 2012-10-23 (×11): qty 1

## 2012-10-23 NOTE — ED Provider Notes (Signed)
CSN: [id number]     Arrival date & time 10/23/12  0815 History   First MD Initiated Contact with Patient 10/23/12 [phone]     Chief Complaint  Patient presents with  . Abdominal Pain  . Emesis   Patient is a 47 y.o. female presenting with abdominal pain and vomiting.  Abdominal Pain Associated symptoms: diarrhea, nausea and vomiting   Associated symptoms: no chest pain, no chills, no constipation, no dysuria, no fatigue, no fever, no shortness of breath and no sore throat   Emesis Associated symptoms: abdominal pain and diarrhea   Associated symptoms: no arthralgias, no chills, no myalgias and no sore throat    Patient is a 47 year old female with history of Crohn's disease and bowel reconstruction in 2008 who presents after one day hx of epigastric pain, burning in nature, that has worsened since appearance around 12 PM. Pt states she ate some chicken wings and potato salad yesterday for lunch, which is slightly out of the ordinary for her, and developed her pain at that time along with some nausea.  This continued through the day, was able to have a few french fries at dinner, but started to vomit slightly after this.  Unsure if bloody or bilious and states she was having a small amount of diarrhea, non bloody, with her emesis episodes.  Denies any radiating abdominal pain, no RLQ pain, and denies previous appendectomy or cholecystectomy.  Was having "hot/cold" episodes but no objective fever, no sweats, denies any CP, HA, blurred vision, shortness of breath or dyspnea on exertion, no new foods or recently eating at restaurant.   No recent travel or sick contacts around her.    Has has BSO/TAH in 2008 as well, no problems since that point.  Denies smoking, alcohol, or illicit drug use.  She was referred to GI doctor at last ED visit a month ago, however, has been unable to follow up since that point.     Past Medical History  Diagnosis Date  . Asthma   . Crohn's disease    Past Surgical  History  Procedure Laterality Date  . Breast surgery    . Cesarean section    . Small intestine surgery    . Abdominal hysterectomy     Family History  Problem Relation Age of Onset  . Hypertension Mother   . Diabetes Sister   . Hypertension Sister    History  Substance Use Topics  . Smoking status: Never Smoker   . Smokeless tobacco: Never Used  . Alcohol Use: No   OB History   Grav Para Term Preterm Abortions TAB SAB Ect Mult Living                 Review of Systems  Constitutional: Negative for fever, chills, fatigue and unexpected weight change.  HENT: Negative for sore throat and trouble swallowing.   Eyes: Negative for pain and visual disturbance.  Respiratory: Negative for shortness of breath and wheezing.   Cardiovascular: Negative for chest pain.  Gastrointestinal: Positive for nausea, vomiting, abdominal pain and diarrhea. Negative for constipation, blood in stool and abdominal distention.  Endocrine: Negative.   Genitourinary: Negative for dysuria.  Musculoskeletal: Negative for myalgias, back pain, joint swelling and arthralgias.  Skin: Negative for rash.  Neurological: Negative for dizziness, weakness, light-headedness and numbness.  Hematological: Negative for adenopathy.  Psychiatric/Behavioral: Negative.   All other systems reviewed and are negative.    Allergies  Review of patient's allergies indicates no known allergies.  Home Medications   Current Outpatient Rx  Name  Route  Sig  Dispense  Refill  . lisinopril-hydrochlorothiazide (PRINZIDE,ZESTORETIC) 20-12.5 MG per tablet   Oral   Take 1 tablet by mouth daily.          BP 135/78  Pulse 79  Temp(Src) 98.6 F (37 C) (Oral)  Resp 20  SpO2 96% Physical Exam  Nursing note and vitals reviewed. Constitutional: She is oriented to person, place, and time. Vital signs are normal. She appears well-developed and well-nourished.  Non-toxic appearance. She does not have a sickly appearance. She  does not appear ill. She appears distressed.  HENT:  Head: Normocephalic and atraumatic.  Mouth/Throat: Mucous membranes are dry.  Multiple scattered aphthous ulcerations   Eyes: Conjunctivae, EOM and lids are normal. Pupils are equal, round, and reactive to light.  Neck: Normal range of motion. No JVD present.  Cardiovascular: Normal rate, regular rhythm, normal heart sounds and intact distal pulses.   No murmur heard. Pulmonary/Chest: Effort normal and breath sounds normal.  Abdominal: Normal appearance and bowel sounds are normal. She exhibits no ascites and no mass. There is no hepatosplenomegaly. There is generalized tenderness (Epigastric > ). There is no rigidity, no rebound, no guarding, no CVA tenderness, no tenderness at McBurney's point and negative Murphy's sign.  Neurological: She is alert and oriented to person, place, and time. She has normal strength and normal reflexes. No cranial nerve deficit or sensory deficit.  Skin: Skin is warm, dry and intact. No rash noted. She is not diaphoretic. No pallor.  Psychiatric: She has a normal mood and affect. Her speech is normal.    ED Course  Procedures (including critical care time) Labs Review Labs Reviewed  COMPREHENSIVE METABOLIC PANEL - Abnormal; Notable for the following:    Glucose, Bld 114 (*)    All other components within normal limits  SEDIMENTATION RATE - Abnormal; Notable for the following:    Sed Rate 29 (*)    All other components within normal limits  URINALYSIS, ROUTINE W REFLEX MICROSCOPIC - Abnormal; Notable for the following:    APPearance CLOUDY (*)    Protein, ur 30 (*)    All other components within normal limits  URINE MICROSCOPIC-ADD ON - Abnormal; Notable for the following:    Bacteria, UA MANY (*)    All other components within normal limits  URINE CULTURE  LIPASE, BLOOD  CBC WITH DIFFERENTIAL  PREGNANCY, URINE   Imaging Review Ct Abdomen Pelvis W Contrast  10/23/2012   CLINICAL DATA:  Upper  abdominal pain, vomiting, prior hysterectomy. Recently diagnosed with Crohn's disease.  EXAM: CT ABDOMEN AND PELVIS WITH CONTRAST  TECHNIQUE: Multidetector CT imaging of the abdomen and pelvis was performed using the standard protocol following bolus administration of intravenous contrast.  CONTRAST:  62m OMNIPAQUE IOHEXOL 300 MG/ML SOLN, 1061mOMNIPAQUE IOHEXOL 300 MG/ML SOLN  COMPARISON:  09/21/2012  FINDINGS: Lung bases are clear.  8 mm probable cyst in the posterior segment right hepatic lobe.  Spleen, pancreas, and adrenal glands are within normal limits.  Gallbladder is unremarkable. No intrahepatic or extrahepatic ductal dilatation.  Kidneys are within normal limits. No hydronephrosis.  Prior distal small bowel resection with anastomosis in the right mid abdomen (series 2/ image 5640 No evidence of bowel obstruction. Abnormal loop of thick-walled small bowel in the right mid abdomen with associated inflammatory changes/mesenteric fluid (series 2/ image 36), compatible with active inflammatory Crohn's disease. Normal appendix. Left colon is decompressed.  No evidence of  abdominal aortic aneurysm.  Small volume pelvic ascites.  No suspicious abdominopelvic lymphadenopathy.  Status post hysterectomy. Left ovary is unremarkable. No right adnexal mass.  Mild degenerative changes of the lower thoracic spine.  IMPRESSION: Active inflammatory Crohn's disease involving an abnormal loop of bowel in the right mid abdomen.  Mild mesenteric fluid/edema with small volume pelvic ascites.  Prior distal small bowel resection. No evidence of bowel obstruction.   Electronically Signed   By: Julian Hy M.D.   On: 10/23/2012 10:36    MDM   1. Nausea and vomiting   2. Abdominal pain   3. Crohn's disease   4. Hypertension   5. Asthma   Pt with generalized abdominal pain, worse in the epigastric region.  DDx includes reflux, gastritis, cholecystitis, pancreatitis, crohn's flare, gastroenteritis, SBO.  Pt states her  Sx are very similar to last month and she has not seen a GI doctor yet or been placed on Prophylactic Crohn's medications.  High on the differential is another Crohn's flare, will get CBC, Lipase, CMET, UA/Upreg, Lipase, and ESR, will consider CT abdomen/pelvix as well to evaluate for acute processes.  Tx with morphine, zofran, GI cocktail as this worked well for her last time.  12:06 PM - Pt lab work showing mild elevation in her ESR, but her LFT's and lipase look nml.  Her CT abdomen does show some active inflammation in concordance with a Crohn's flare.  Will discuss the case with the on call GI doctor in regards to inpatient vs outpatient management/work up.   12:45 PM - Discussed the case with Dr. Collene Mares, on call GI doctor who recommends inpatient admission w/ evaluation.  Will page on call hospitalist for admission.   2:51 PM - Discussed case with Dr. Grandville Silos, triad hospitalist, who will evaluate and admit the patient.   Tamela Oddi Awanda Mink, DO of [facility] 10/23/2012, 2:52 PM      Nolon Rod, DO 10/23/12 1452

## 2012-10-23 NOTE — Consult Note (Addendum)
Unassigned patient Reason for Consult: Crohn's disease of the small bowel. Referring Physician: THP  Andrea Wilkins is an 47 y.o. female.  HPI: Patient gives a history of being diagnosed with Crohn's disease of the small bowel after she had hysterectomy; reportedly she has a T.I resection for Crohn's ileitis. She claims she has been having abdominal pain off and on but due to lack of insurance she has not sought care. She was given a shirt course of steroids after a visit to the ER for similar complaints. She has occasional loose stools but denies having any melena or hematochezia. She has a fairly good appetite and her weight has been stable.   Past Medical History  Diagnosis Date  . Asthma   . Crohn's disease    Past Surgical History  Procedure Laterality Date  . Breast surgery    . Cesarean section    . Small intestine surgery    . Abdominal hysterectomy     Family History  Problem Relation Age of Onset  . Hypertension Mother   . Diabetes Sister   . Hypertension Sister    Social History:  reports that she has never smoked. She has never used smokeless tobacco. She reports that she does not drink alcohol or use illicit drugs. She is currently unemployed and has no health care insurance.   Allergies: No Known Allergies  Medications: I have reviewed the patient's current medications.  Results for orders placed during the hospital encounter of 10/23/12 (from the past 48 hour(s))  LIPASE, BLOOD     Status: None   Collection Time    10/23/12  9:15 AM      Result Value Range   Lipase 16  11 - 59 U/L  COMPREHENSIVE METABOLIC PANEL     Status: Abnormal   Collection Time    10/23/12  9:15 AM      Result Value Range   Sodium 137  135 - 145 mEq/L   Potassium 4.0  3.5 - 5.1 mEq/L   Chloride 99  96 - 112 mEq/L   CO2 27  19 - 32 mEq/L   Glucose, Bld 114 (*) 70 - 99 mg/dL   BUN 9  6 - 23 mg/dL   Creatinine, Ser 0.73  0.50 - 1.10 mg/dL   Calcium 9.4  8.4 - 10.5 mg/dL   Total Protein  7.4  6.0 - 8.3 g/dL   Albumin 4.0  3.5 - 5.2 g/dL   AST 13  0 - 37 U/L   ALT 8  0 - 35 U/L   Alkaline Phosphatase 60  39 - 117 U/L   Total Bilirubin 0.4  0.3 - 1.2 mg/dL   GFR calc non Af Amer >90  >90 mL/min   GFR calc Af Amer >90  >90 mL/min   Comment: (NOTE)     The eGFR has been calculated using the CKD EPI equation.     This calculation has not been validated in all clinical situations.     eGFR's persistently <90 mL/min signify possible Chronic Kidney     Disease.  SEDIMENTATION RATE     Status: Abnormal   Collection Time    10/23/12  9:15 AM      Result Value Range   Sed Rate 29 (*) 0 - 22 mm/hr  CBC WITH DIFFERENTIAL     Status: None   Collection Time    10/23/12  9:15 AM      Result Value Range   WBC 5.2  4.0 - 10.5 K/uL   RBC 4.35  3.87 - 5.11 MIL/uL   Hemoglobin 12.8  12.0 - 15.0 g/dL   HCT 37.9  36.0 - 46.0 %   MCV 87.1  78.0 - 100.0 fL   MCH 29.4  26.0 - 34.0 pg   MCHC 33.8  30.0 - 36.0 g/dL   RDW 12.8  11.5 - 15.5 %   Platelets 215  150 - 400 K/uL   Neutrophils Relative % 77  43 - 77 %   Neutro Abs 4.0  1.7 - 7.7 K/uL   Lymphocytes Relative 17  12 - 46 %   Lymphs Abs 0.9  0.7 - 4.0 K/uL   Monocytes Relative 5  3 - 12 %   Monocytes Absolute 0.3  0.1 - 1.0 K/uL   Eosinophils Relative 1  0 - 5 %   Eosinophils Absolute 0.0  0.0 - 0.7 K/uL   Basophils Relative 0  0 - 1 %   Basophils Absolute 0.0  0.0 - 0.1 K/uL  URINALYSIS, ROUTINE W REFLEX MICROSCOPIC     Status: Abnormal   Collection Time    10/23/12 10:06 AM      Result Value Range   Color, Urine YELLOW  YELLOW   APPearance CLOUDY (*) CLEAR   Specific Gravity, Urine 1.023  1.005 - 1.030   pH 8.0  5.0 - 8.0   Glucose, UA NEGATIVE  NEGATIVE mg/dL   Hgb urine dipstick NEGATIVE  NEGATIVE   Bilirubin Urine NEGATIVE  NEGATIVE   Ketones, ur NEGATIVE  NEGATIVE mg/dL   Protein, ur 30 (*) NEGATIVE mg/dL   Urobilinogen, UA 0.2  0.0 - 1.0 mg/dL   Nitrite NEGATIVE  NEGATIVE   Leukocytes, UA NEGATIVE  NEGATIVE   PREGNANCY, URINE     Status: None   Collection Time    10/23/12 10:06 AM      Result Value Range   Preg Test, Ur NEGATIVE  NEGATIVE   Comment:            THE SENSITIVITY OF THIS     METHODOLOGY IS >20 mIU/mL.  URINE MICROSCOPIC-ADD ON     Status: Abnormal   Collection Time    10/23/12 10:06 AM      Result Value Range   Squamous Epithelial / LPF RARE  RARE   WBC, UA 3-6  <3 WBC/hpf   Bacteria, UA MANY (*) RARE  CBC     Status: Abnormal   Collection Time    10/23/12  2:55 PM      Result Value Range   WBC 4.6  4.0 - 10.5 K/uL   RBC 4.02  3.87 - 5.11 MIL/uL   Hemoglobin 11.8 (*) 12.0 - 15.0 g/dL   HCT 35.1 (*) 36.0 - 46.0 %   MCV 87.3  78.0 - 100.0 fL   MCH 29.4  26.0 - 34.0 pg   MCHC 33.6  30.0 - 36.0 g/dL   RDW 12.8  11.5 - 15.5 %   Platelets 209  150 - 400 K/uL  CREATININE, SERUM     Status: Abnormal   Collection Time    10/23/12  2:55 PM      Result Value Range   Creatinine, Ser 0.81  0.50 - 1.10 mg/dL   GFR calc non Af Amer 86 (*) >90 mL/min   GFR calc Af Amer >90  >90 mL/min   Comment: (NOTE)     The eGFR has been calculated using the CKD EPI equation.  This calculation has not been validated in all clinical situations.     eGFR's persistently <90 mL/min signify possible Chronic Kidney     Disease.  MAGNESIUM     Status: None   Collection Time    10/23/12  2:55 PM      Result Value Range   Magnesium 1.9  1.5 - 2.5 mg/dL  APTT     Status: None   Collection Time    10/23/12  2:55 PM      Result Value Range   aPTT 28  24 - 37 seconds  PROTIME-INR     Status: None   Collection Time    10/23/12  2:55 PM      Result Value Range   Prothrombin Time 12.5  11.6 - 15.2 seconds   INR 0.95  0.00 - 1.49   Ct Abdomen Pelvis W Contrast  10/23/2012   CLINICAL DATA:  Upper abdominal pain, vomiting, prior hysterectomy. Recently diagnosed with Crohn's disease.  EXAM: CT ABDOMEN AND PELVIS WITH CONTRAST  TECHNIQUE: Multidetector CT imaging of the abdomen and pelvis was  performed using the standard protocol following bolus administration of intravenous contrast.  CONTRAST:  37m OMNIPAQUE IOHEXOL 300 MG/ML SOLN, 1043mOMNIPAQUE IOHEXOL 300 MG/ML SOLN  COMPARISON:  09/21/2012  FINDINGS: Lung bases are clear.  8 mm probable cyst in the posterior segment right hepatic lobe.  Spleen, pancreas, and adrenal glands are within normal limits.  Gallbladder is unremarkable. No intrahepatic or extrahepatic ductal dilatation.  Kidneys are within normal limits. No hydronephrosis.  Prior distal small bowel resection with anastomosis in the right mid abdomen (series 2/ image 5625 No evidence of bowel obstruction. Abnormal loop of thick-walled small bowel in the right mid abdomen with associated inflammatory changes/mesenteric fluid (series 2/ image 36), compatible with active inflammatory Crohn's disease. Normal appendix. Left colon is decompressed.  No evidence of abdominal aortic aneurysm.  Small volume pelvic ascites.  No suspicious abdominopelvic lymphadenopathy.  Status post hysterectomy. Left ovary is unremarkable. No right adnexal mass.  Mild degenerative changes of the lower thoracic spine.  IMPRESSION: Active inflammatory Crohn's disease involving an abnormal loop of bowel in the right mid abdomen.  Mild mesenteric fluid/edema with small volume pelvic ascites.  Prior distal small bowel resection. No evidence of bowel obstruction.   Electronically Signed   By: SrJulian Hy.D.   On: 10/23/2012 10:36   Review of Systems  Constitutional: Negative.   HENT: Negative.   Eyes: Negative.   Respiratory: Negative.   Cardiovascular: Negative.   Gastrointestinal: Positive for nausea, vomiting and abdominal pain. Negative for heartburn, diarrhea, constipation, blood in stool and melena.  Genitourinary: Negative.   Musculoskeletal: Negative.   Neurological: Negative.    Blood pressure 141/83, pulse 72, temperature 98.7 F (37.1 C), temperature source Oral, resp. rate 20, height 5'  2.4" (1.585 m), weight 91.173 kg (201 lb), SpO2 100.00%. Physical Exam  Constitutional: She is oriented to person, place, and time. She appears well-developed and well-nourished.  HENT:  Head: Normocephalic and atraumatic.  Eyes: Conjunctivae and EOM are normal. Pupils are equal, round, and reactive to light.  Neck: Normal range of motion. Neck supple.  Cardiovascular: Normal rate and regular rhythm.   Respiratory: Effort normal and breath sounds normal.  GI: Soft. She exhibits no distension and no mass. There is tenderness. There is no rebound and no guarding.  Musculoskeletal: Normal range of motion.  Neurological: She is alert and oriented to person, place, and time.  Skin:  Skin is warm and dry.  Psychiatric: She has a normal mood and affect. Her behavior is normal. Judgment and thought content normal.   Assessment/Plan: 1) Small bowel Crohn's with an active loop mid-right abdomen-as discussed with Dr. Grandville Silos we will treat her with IV steroids and try to see if we can get her some health insurance so that she can receive biologics or Imuran for her small bowel disease.  2) HTN on Lisinopril. 3) Asthma.   Kelvis Berger 10/23/2012, 4:32 PM

## 2012-10-23 NOTE — ED Notes (Signed)
Pt c/o upper abdominal pain and emesis since last night.  Pain score 10/10.  Sts diagnoses with Crohn's disease x 2 months ago.

## 2012-10-23 NOTE — ED Provider Notes (Signed)
I saw and evaluated the patient, reviewed the resident's note and I agree with the findings and plan.   and the patient gastric pain and vomiting. History of Crohn's. Is not currently on any maintenance therapy for Crohn's. Recently seen one month ago that is a that time. Patient was discharged after symptomatic relief with medications at that time. Patient here without fevers, however she has severe  epigastric pain. No right upper quadrant pain, but most pain is epigastric. CT scan shows active Crohn's. GI stated patient would required admission, patient admitted to medicine.  Osvaldo Shipper, MD 10/23/12 1538

## 2012-10-23 NOTE — H&P (Signed)
[facility] History and Physical  Andrea Wilkins ION:[id number] DOB: [birth year] DOA: 10/23/2012  Referring physician: Dr Hess/Dr Mingo Amber PCP: No primary provider on file.  Specialists:   Chief Complaint: n/v/abd pain  HPI: Andrea Wilkins is a 47 y.o. female  With history of Crohn's disease and bowel reconstruction in 2008, hypertension, asthma who presents to the ED with a one-day history of epigastric and lower abdominal pain sharp in nature with associated nausea, nonbloody emesis, lightheadedness, chills. Patient stated that symptoms started 3 hours after she ate on chicken wings and potato salad at a wedding rehearsal. Patient denies any burning in nature of her abdominal pain. Patient denies any diarrhea. Patient denies any fever, no chest pain, no shortness of breath, no dysuria, no cough, no malaise. Patient stated had similar symptoms 2 months prior to admission and at that time was diagnosed with a Crohn's flare. Patient stated that she was started on some medications however cannot remember which ones. Patient stated that she was only given a prescription for 3 weeks of it and has been out of medications for approximately one month. Patient was seen in the emergency room urinalysis done was unremarkable. Urine pregnancy test was negative. Compressive metabolic profile was unremarkable. CBC was unremarkable. Sedimentation rate was slightly elevated at 29. CT of the abdomen and pelvis showed active inflammatory Crohn's disease involving an abnormal loop of the bowel in the right mid abdomen. Mild mesenteric fluid/edema with small volume of pelvic ascites. Prior distal small bowel resection. No evidence of bowel obstruction. We were consulted to admit the patient for further evaluation and management. The ED resident he has spoken with GI consult, Dr. Collene Mares who stated that she will consult on the patient.  Review of Systems: The patient denies anorexia, fever, weight loss,, vision loss,  decreased hearing, hoarseness, chest pain, syncope, dyspnea on exertion, peripheral edema, balance deficits, hemoptysis, abdominal pain, melena, hematochezia, severe indigestion/heartburn, hematuria, incontinence, genital sores, muscle weakness, suspicious skin lesions, transient blindness, difficulty walking, depression, unusual weight change, abnormal bleeding, enlarged lymph nodes, angioedema, and breast masses.   Past Medical History  Diagnosis Date  . Asthma   . Crohn's disease    Past Surgical History  Procedure Laterality Date  . Breast surgery    . Cesarean section    . Small intestine surgery    . Abdominal hysterectomy     Social History:  reports that she has never smoked. She has never used smokeless tobacco. She reports that she does not drink alcohol or use illicit drugs.  No Known Allergies  Family History  Problem Relation Age of Onset  . Hypertension Mother   . Diabetes Sister   . Hypertension Sister     Prior to Admission medications   Medication Sig Start Date End Date Taking? Authorizing Provider  lisinopril-hydrochlorothiazide (PRINZIDE,ZESTORETIC) 20-12.5 MG per tablet Take 1 tablet by mouth daily.   Yes Historical Provider, MD   Physical Exam: Filed Vitals:   10/23/12 1321  BP: 114/65  Pulse: 81  Temp:   Resp:      General:  Well-developed well-nourished in no acute cardiopulmonary distress.  Eyes: Pupils equal round and reactive to light and accommodation. Extraocular movements intact.  ENT: Oropharynx is clear, no lesions, no exudates.  Neck: Supple with no lymphadenopathy.  Cardiovascular: Regular rate rhythm no murmurs rubs or gallops.  Respiratory: Clear to auscultation bilaterally. No wheezes, no crackles, no rhonchi.  Abdomen: Soft, tender to palpation in the epigastrium, right upper quadrant, left lower quadrant,  suprapubic tenderness, right lower quadrant. No guarding or rebound.  Skin: No rashes or lesions.  Musculoskeletal: 5/5  BUE strength, 5/5 bilateral upper extremity strength.  Psychiatric: Normal mood. Normal affect. Good insight. Good judgment.  Neurologic: Alert and oriented x3. Cranial nerve II through XII are grossly intact. No focal deficits.  Labs on Admission:  Basic Metabolic Panel:  Recent Labs Lab 10/23/12 0915  NA 137  K 4.0  CL 99  CO2 27  GLUCOSE 114*  BUN 9  CREATININE 0.73  CALCIUM 9.4   Liver Function Tests:  Recent Labs Lab 10/23/12 0915  AST 13  ALT 8  ALKPHOS 60  BILITOT 0.4  PROT 7.4  ALBUMIN 4.0    Recent Labs Lab 10/23/12 0915  LIPASE 16   No results found for this basename: AMMONIA,  in the last 168 hours CBC:  Recent Labs Lab 10/23/12 0915  WBC 5.2  NEUTROABS 4.0  HGB 12.8  HCT 37.9  MCV 87.1  PLT 215   Cardiac Enzymes: No results found for this basename: CKTOTAL, CKMB, CKMBINDEX, TROPONINI,  in the last 168 hours  BNP (last 3 results) No results found for this basename: PROBNP,  in the last 8760 hours CBG: No results found for this basename: GLUCAP,  in the last 168 hours  Radiological Exams on Admission: Ct Abdomen Pelvis W Contrast  10/23/2012   CLINICAL DATA:  Upper abdominal pain, vomiting, prior hysterectomy. Recently diagnosed with Crohn's disease.  EXAM: CT ABDOMEN AND PELVIS WITH CONTRAST  TECHNIQUE: Multidetector CT imaging of the abdomen and pelvis was performed using the standard protocol following bolus administration of intravenous contrast.  CONTRAST:  50m OMNIPAQUE IOHEXOL 300 MG/ML SOLN, 1049mOMNIPAQUE IOHEXOL 300 MG/ML SOLN  COMPARISON:  09/21/2012  FINDINGS: Lung bases are clear.  8 mm probable cyst in the posterior segment right hepatic lobe.  Spleen, pancreas, and adrenal glands are within normal limits.  Gallbladder is unremarkable. No intrahepatic or extrahepatic ductal dilatation.  Kidneys are within normal limits. No hydronephrosis.  Prior distal small bowel resection with anastomosis in the right mid abdomen (series 2/  image 5662 No evidence of bowel obstruction. Abnormal loop of thick-walled small bowel in the right mid abdomen with associated inflammatory changes/mesenteric fluid (series 2/ image 36), compatible with active inflammatory Crohn's disease. Normal appendix. Left colon is decompressed.  No evidence of abdominal aortic aneurysm.  Small volume pelvic ascites.  No suspicious abdominopelvic lymphadenopathy.  Status post hysterectomy. Left ovary is unremarkable. No right adnexal mass.  Mild degenerative changes of the lower thoracic spine.  IMPRESSION: Active inflammatory Crohn's disease involving an abnormal loop of bowel in the right mid abdomen.  Mild mesenteric fluid/edema with small volume pelvic ascites.  Prior distal small bowel resection. No evidence of bowel obstruction.   Electronically Signed   By: SrJulian Hy.D.   On: 10/23/2012 10:36    EKG: Not done  Assessment/Plan Principal Problem:   Crohn disease Active Problems:   Hypertension   Abdominal pain   Asthma  #1 probable acute Crohn's disease flare May be secondary to patient having run out of her maintenance medications over approximately a month ago. The patient presented with abdominal pain, nausea vomiting. CT scan which was done was consistent with active Crohn's flare. Check a CRP. Sedimentation rate slightly elevated. Will keep patient n.p.o. Pain management. Start patient on Solu-Medrol 60 mg IV every 12 hours. IV fluids. Supportive care. We'll consult with GI, Dr. MaCollene Maresho I have spoken with and  will see the patient.  #2 hypertension Stable. Resume home dose lisinopril.  #3 abdominal pain Likely secondary to problem #1.  #4 asthma Stable. Nebs as needed.  #5 prophylaxis PPI for GI prophylaxis. Lovenox for DVT prophylaxis.   Code Status: Full Family Communication: Updated patient no family at bedside. Disposition Plan: Admit to med surg  Time spent: 65 mins  [facility] Pager  [phone]  If 7PM-7AM, please contact night-coverage [email or web address] Password [facility] 10/23/2012, 2:18 PM

## 2012-10-23 NOTE — ED Notes (Signed)
Dr Grandville Silos at pt bedside to do admitting assessment,does not want pt to go upstairs at this time. Called 5E and notified that pt will be up when Dr. Grandville Silos is done

## 2012-10-23 NOTE — ED Notes (Signed)
Patient transported to CT 

## 2012-10-24 LAB — COMPREHENSIVE METABOLIC PANEL
ALT: 5 U/L (ref 0–35)
AST: 12 U/L (ref 0–37)
Albumin: 3.5 g/dL (ref 3.5–5.2)
Alkaline Phosphatase: 54 U/L (ref 39–117)
BUN: 7 mg/dL (ref 6–23)
CO2: 22 mEq/L (ref 19–32)
Calcium: 8.9 mg/dL (ref 8.4–10.5)
Chloride: 104 mEq/L (ref 96–112)
Creatinine, Ser: 0.59 mg/dL (ref 0.50–1.10)
GFR calc Af Amer: >90 mL/min (ref >90)
GFR calc non Af Amer: >90 mL/min (ref >90)
Glucose, Bld: 137 mg/dL — ABNORMAL HIGH (ref 70–99)
Potassium: 4 mEq/L (ref 3.5–5.1)
Sodium: 136 mEq/L (ref 135–145)
Total Bilirubin: 0.3 mg/dL (ref 0.3–1.2)
Total Protein: 6.6 g/dL (ref 6.0–8.3)

## 2012-10-24 LAB — CBC
HCT: 34.1 % — ABNORMAL LOW (ref 36.0–46.0)
Hemoglobin: 11.4 g/dL — ABNORMAL LOW (ref 12.0–15.0)
MCH: 29 pg (ref 26.0–34.0)
MCHC: 33.4 g/dL (ref 30.0–36.0)
MCV: 86.8 fL (ref 78.0–100.0)
Platelets: 207 10*3/uL (ref 150–400)
RBC: 3.93 MIL/uL (ref 3.87–5.11)
RDW: 12.8 % (ref 11.5–15.5)
WBC: 5.1 10*3/uL (ref 4.0–10.5)

## 2012-10-24 LAB — URINE CULTURE: Colony Count: 1000

## 2012-10-24 NOTE — Progress Notes (Signed)
Unassigned patient: Subjective: Since I last evaluated the patient, she seems to be doing slightly better. She has had some improvement in her abdominal pain. She claims she is still nauseated and was only able to keep some jello down. No diarrhea or GI bleeding.   Objective: Vital signs in last 24 hours: Temp:  [97.6 F (36.4 C)-98.4 F (36.9 C)] 98.1 F (36.7 C) (09/28 1404) Pulse Rate:  [58-91] 91 (09/28 1404) Resp:  [18] 18 (09/28 1404) BP: (121-133)/(64-88) 133/88 mmHg (09/28 1404) SpO2:  [94 %-96 %] 96 % (09/28 1404) Weight:  [88.3 kg (194 lb 10.7 oz)] 88.3 kg (194 lb 10.7 oz) (09/28 0602) Last BM Date: 10/22/12  Intake/Output from previous day:   Intake/Output this shift:    General appearance: alert, cooperative, appears stated age, fatigued and no distress Resp: clear to auscultation bilaterally Cardio: regular rate and rhythm, S1, S2 normal, no murmur, click, rub or gallop GI: soft with slight epigastric tenderness on palpation with no gaurding; bowel sounds normal; no masses,  no organomegaly Extremities: extremities normal, atraumatic, no cyanosis or edema  Lab Results:  Recent Labs  10/23/12 0915 10/23/12 1455 10/24/12 0535  WBC 5.2 4.6 5.1  HGB 12.8 11.8* 11.4*  HCT 37.9 35.1* 34.1*  PLT 215 209 207   BMET  Recent Labs  10/23/12 0915 10/23/12 1455 10/24/12 0535  NA 137  --  136  K 4.0  --  4.0  CL 99  --  104  CO2 27  --  22  GLUCOSE 114*  --  137*  BUN 9  --  7  CREATININE 0.73 0.81 0.59  CALCIUM 9.4  --  8.9   LFT  Recent Labs  10/24/12 0535  PROT 6.6  ALBUMIN 3.5  AST 12  ALT 5  ALKPHOS 54  BILITOT 0.3   PT/INR  Recent Labs  10/23/12 1455  LABPROT 12.5  INR 0.95   Studies/Results: Ct Abdomen Pelvis W Contrast  10/23/2012   CLINICAL DATA:  Upper abdominal pain, vomiting, prior hysterectomy. Recently diagnosed with Crohn's disease.  EXAM: CT ABDOMEN AND PELVIS WITH CONTRAST  TECHNIQUE: Multidetector CT imaging of the abdomen  and pelvis was performed using the standard protocol following bolus administration of intravenous contrast.  CONTRAST:  43m OMNIPAQUE IOHEXOL 300 MG/ML SOLN, 1095mOMNIPAQUE IOHEXOL 300 MG/ML SOLN  COMPARISON:  09/21/2012  FINDINGS: Lung bases are clear.  8 mm probable cyst in the posterior segment right hepatic lobe.  Spleen, pancreas, and adrenal glands are within normal limits.  Gallbladder is unremarkable. No intrahepatic or extrahepatic ductal dilatation.  Kidneys are within normal limits. No hydronephrosis.  Prior distal small bowel resection with anastomosis in the right mid abdomen (series 2/ image 5614 No evidence of bowel obstruction. Abnormal loop of thick-walled small bowel in the right mid abdomen with associated inflammatory changes/mesenteric fluid (series 2/ image 36), compatible with active inflammatory Crohn's disease. Normal appendix. Left colon is decompressed.  No evidence of abdominal aortic aneurysm.  Small volume pelvic ascites.  No suspicious abdominopelvic lymphadenopathy.  Status post hysterectomy. Left ovary is unremarkable. No right adnexal mass.  Mild degenerative changes of the lower thoracic spine.  IMPRESSION: Active inflammatory Crohn's disease involving an abnormal loop of bowel in the right mid abdomen.  Mild mesenteric fluid/edema with small volume pelvic ascites.  Prior distal small bowel resection. No evidence of bowel obstruction.   Electronically Signed   By: SrJulian Hy.D.   On: 10/23/2012 10:36   Medications: I  have reviewed the patient's current medications.  Assessment/Plan: Crohn's disease of the small bowel: she will benefit from a CT enteroscopy to evaluate the extent of her disease. She will need to be started on Imuran for this. I do not think she will be able to get biologics but we can wait and see if she is able to get some sort of medical coverage and then decide what treatment options we can offer her. We will continue steroids for now.   LOS: 1  day   Zniya Cottone 10/24/2012, 2:51 PM

## 2012-10-24 NOTE — Care Management Note (Signed)
Cm spoke with patient at bedside concerning CM consult for medication assistance. Per pt community pharmacy provides prescriptions at discounted rate in which she can afford. Pt states unable to afford cost of GI consult related to no insurance coverage. CM provided pt with information concerning [employer] for PCP establishment and insurance coverage. Pt informed PCP can refer pt for further GI consult. No other needs identified at this time.   Venita Lick Abbigael Detlefsen,RN,MSN [phone]

## 2012-10-24 NOTE — Progress Notes (Signed)
[facility] PROGRESS NOTE  Andrea Wilkins JOI:[id number] DOB: 1965-04-22 DOA: 10/23/2012  PCP: No primary provider on file.  Brief HPI: 47yo AAF with past history of Crohn's diagnosed in 2008 and was in remission until earlier this year when she started having symptoms again. She used to live in [place], [place] where she was initially diagnosed. She presented with abdominal pain, nausea and vomiting and underwent Ct which revealed findings of crohn's.  Past medical history:  Past Medical History  Diagnosis Date  . Asthma   . Crohn's disease     Consultants: Dr. Hoyt Koch)  Procedures: None  Antibiotics: Nne  Subjective: Patient feels better today. Nausea but no vomiting. Abdominal pain is better at 6/10. Passing flatus.  Objective: Vital Signs  Filed Vitals:   10/23/12 1430 10/23/12 2153 10/24/12 0602 10/24/12 0920  BP:  121/72 124/64 126/68  Pulse:  83 58   Temp:  98.4 F (36.9 C) 97.6 F (36.4 C)   TempSrc:  Oral Oral   Resp:  18 18   Height: 5' 2.4" (1.585 m)     Weight: 91.173 kg (201 lb)  88.3 kg (194 lb 10.7 oz)   SpO2:  95% 94%    No intake or output data in the 24 hours ending 10/24/12 1111 Filed Weights   10/23/12 1415 10/23/12 1430 10/24/12 0602  Weight: 83.3 kg (183 lb 10.3 oz) 91.173 kg (201 lb) 88.3 kg (194 lb 10.7 oz)    General appearance: alert, cooperative, appears stated age and no distress Throat: lips, mucosa, and tongue normal; teeth and gums normal Resp: clear to auscultation bilaterally Cardio: regular rate and rhythm, S1, S2 normal, no murmur, click, rub or gallop GI: Soft, mildly tender around umbilicus, no rebound rigidity. BS present, no masses or organomegaly. Extremities: extremities normal, atraumatic, no cyanosis or edema Neurologic: Alert and oriented x 3. No focal deficits.  Lab Results:  Basic Metabolic Panel:  Recent Labs Lab 10/23/12 0915 10/23/12 1455 10/24/12 0535  NA 137  --  136  K 4.0  --  4.0  CL 99  --  104  CO2  27  --  22  GLUCOSE 114*  --  137*  BUN 9  --  7  CREATININE 0.73 0.81 0.59  CALCIUM 9.4  --  8.9  MG  --  1.9  --    Liver Function Tests:  Recent Labs Lab 10/23/12 0915 10/24/12 0535  AST 13 12  ALT 8 5  ALKPHOS 60 54  BILITOT 0.4 0.3  PROT 7.4 6.6  ALBUMIN 4.0 3.5    Recent Labs Lab 10/23/12 0915  LIPASE 16   CBC:  Recent Labs Lab 10/23/12 0915 10/23/12 1455 10/24/12 0535  WBC 5.2 4.6 5.1  NEUTROABS 4.0  --   --   HGB 12.8 11.8* 11.4*  HCT 37.9 35.1* 34.1*  MCV 87.1 87.3 86.8  PLT 215 209 207    Studies/Results: Ct Abdomen Pelvis W Contrast  10/23/2012   CLINICAL DATA:  Upper abdominal pain, vomiting, prior hysterectomy. Recently diagnosed with Crohn's disease.  EXAM: CT ABDOMEN AND PELVIS WITH CONTRAST  TECHNIQUE: Multidetector CT imaging of the abdomen and pelvis was performed using the standard protocol following bolus administration of intravenous contrast.  CONTRAST:  78m OMNIPAQUE IOHEXOL 300 MG/ML SOLN, 1050mOMNIPAQUE IOHEXOL 300 MG/ML SOLN  COMPARISON:  09/21/2012  FINDINGS: Lung bases are clear.  8 mm probable cyst in the posterior segment right hepatic lobe.  Spleen, pancreas, and adrenal glands are within  normal limits.  Gallbladder is unremarkable. No intrahepatic or extrahepatic ductal dilatation.  Kidneys are within normal limits. No hydronephrosis.  Prior distal small bowel resection with anastomosis in the right mid abdomen (series 2/ image 58). No evidence of bowel obstruction. Abnormal loop of thick-walled small bowel in the right mid abdomen with associated inflammatory changes/mesenteric fluid (series 2/ image 36), compatible with active inflammatory Crohn's disease. Normal appendix. Left colon is decompressed.  No evidence of abdominal aortic aneurysm.  Small volume pelvic ascites.  No suspicious abdominopelvic lymphadenopathy.  Status post hysterectomy. Left ovary is unremarkable. No right adnexal mass.  Mild degenerative changes of the lower  thoracic spine.  IMPRESSION: Active inflammatory Crohn's disease involving an abnormal loop of bowel in the right mid abdomen.  Mild mesenteric fluid/edema with small volume pelvic ascites.  Prior distal small bowel resection. No evidence of bowel obstruction.   Electronically Signed   By: Julian Hy M.D.   On: 10/23/2012 10:36    Medications:  Scheduled: . docusate sodium  100 mg Oral BID  . enoxaparin (LOVENOX) injection  40 mg Subcutaneous Q24H  . lisinopril  20 mg Oral Daily  . methylPREDNISolone (SOLU-MEDROL) injection  60 mg Intravenous Q12H  . pantoprazole  40 mg Oral Q0600   Continuous: . sodium chloride 125 mL/hr at 10/24/12 0532   VQQ:[id number], acetaminophen, albuterol, alum & mag hydroxide-simeth, gi cocktail, morphine injection, ondansetron (ZOFRAN) IV, ondansetron, oxyCODONE, polyethylene glycol, sorbitol  Assessment/Plan:  Principal Problem:   Crohn disease Active Problems:   Hypertension   Abdominal pain   Asthma    Probable Acute Crohn's disease flare  May be secondary to patient having run out of her maintenance medications over approximately a month ago. She seems to be improving. Continue steroids. Appreciate GI input. Continue clear liquids.   History of Hypertension  Stable. Resume home dose lisinopril.   History of Asthma  Stable. Nebs as needed.   Code Status:  Full Code DVT Prophylaxis:   Lovenox Family Communication: Discussed with patient  Disposition Plan: Not ready for discharge.    LOS: 1 day   [facility] Pager [phone] 10/24/2012, 11:11 AM  If 8PM-8AM, please contact night-coverage at [email or web address], password [facility]

## 2012-10-25 MED ORDER — MORPHINE SULFATE 2 MG/ML IJ SOLN
2.0000 mg | INTRAMUSCULAR | Status: DC | PRN
  Administered 2012-10-25 – 2012-10-26 (×3): 2 mg via INTRAVENOUS
  Filled 2012-10-25 (×3): qty 1

## 2012-10-25 NOTE — Progress Notes (Signed)
INITIAL NUTRITION ASSESSMENT  DOCUMENTATION CODES Per approved criteria  -Obesity Unspecified   INTERVENTION: - Diet advancement per MD to low fiber diet - Will continue to monitor   NUTRITION DIAGNOSIS: Altered GI function related to ongoing nausea as evidenced by pt report.   Goal: 1. Resolution of nausea 2. Pt to consume >90% of meals  Monitor:  Weights, labs, intake, nausea  Reason for Assessment: Nutrition risk   47 y.o. female  Admitting Dx: Crohn disease  ASSESSMENT: Pt with history of Crohn's disease and bowel reconstruction in 2008, hypertension, asthma who presents to the ED with a one-day history of epigastric and lower abdominal pain sharp in nature with associated nausea, nonbloody emesis, lightheadedness, chills. Patient stated that symptoms started 3 hours after she ate on chicken wings and potato salad at a wedding rehearsal.  Met with pt who reports she had a good appetite PTA, was eating 2 meals/day with stable weight. Pt reports she was able to eat some jello, broth, yogurt, italian ice, and ginger-ale this morning and tolerated it well. Reports she had some spit up of thick fluid yesterday but none today. Pt c/o a little bit of nausea today. GI following.    Height: Ht Readings from Last 1 Encounters:  10/23/12 5' 2.4" (1.585 m)    Weight: Wt Readings from Last 1 Encounters:  10/25/12 203 lb 0.7 oz (92.1 kg)    Ideal Body Weight: 110 lb   % Ideal Body Weight: 185%  Wt Readings from Last 10 Encounters:  10/25/12 203 lb 0.7 oz (92.1 kg)  01/19/12 181 lb (82.101 kg)    Usual Body Weight: 200 lb per pt  % Usual Body Weight: 101%  BMI:  Body mass index is 36.66 kg/(m^2). Class II obesity  Estimated Nutritional Needs: Kcal: 1500-1650 Protein: 60-70g Fluid: 1.5-1.6L/day  Skin: Intact   Diet Order: Full Liquid  EDUCATION NEEDS: -No education needs identified at this time   Intake/Output Summary (Last 24 hours) at 10/25/12 1611 Last  data filed at 10/25/12 1407  Gross per 24 hour  Intake 1621.17 ml  Output      0 ml  Net 1621.17 ml    Last BM: 9/28  Labs:   Recent Labs Lab 10/23/12 0915 10/23/12 1455 10/24/12 0535  NA 137  --  136  K 4.0  --  4.0  CL 99  --  104  CO2 27  --  22  BUN 9  --  7  CREATININE 0.73 0.81 0.59  CALCIUM 9.4  --  8.9  MG  --  1.9  --   GLUCOSE 114*  --  137*    CBG (last 3)  No results found for this basename: GLUCAP,  in the last 72 hours  Scheduled Meds: . enoxaparin (LOVENOX) injection  40 mg Subcutaneous Q24H  . lisinopril  20 mg Oral Daily  . methylPREDNISolone (SOLU-MEDROL) injection  60 mg Intravenous Q12H  . pantoprazole  40 mg Oral Q0600    Continuous Infusions:   Past Medical History  Diagnosis Date  . Asthma   . Crohn's disease     Past Surgical History  Procedure Laterality Date  . Breast surgery    . Cesarean section    . Small intestine surgery    . Abdominal hysterectomy      Mikey College MS, [place], [place] Pager [phone] After Hours Pager

## 2012-10-25 NOTE — Progress Notes (Signed)
Unassigned patient Subjective: Since I last evaluated the patient, she seems to be doing slightly better. No BM's today. Has had a lot of gas and bloating. Tolerating her diet well.   Objective: Vital signs in last 24 hours: Temp:  [98.2 F (36.8 C)-98.6 F (37 C)] 98.6 F (37 C) (09/29 1407) Pulse Rate:  [70-80] 80 (09/29 1407) Resp:  [16-18] 18 (09/29 1407) BP: (130-156)/(76-90) 156/90 mmHg (09/29 1407) SpO2:  [95 %-97 %] 95 % (09/29 1407) Weight:  [92.1 kg (203 lb 0.7 oz)] 92.1 kg (203 lb 0.7 oz) (09/29 0903) Last BM Date: 10/24/12  Intake/Output from previous day: 09/28 0701 - 09/29 0700 In: 781.2 [I.V.:781.2] Out: -  Intake/Output this shift: Total I/O In: 840 [P.O.:840] Out: -   General appearance: alert, cooperative, appears stated age and no distress Resp: clear to auscultation bilaterally Cardio: regular rate and rhythm, S1, S2 normal, no murmur, click, rub or gallop GI: soft, non-tender; bowel sounds normal; no masses,  no organomegaly Extremities: extremities normal, atraumatic, no cyanosis or edema  Lab Results:  Recent Labs  10/23/12 0915 10/23/12 1455 10/24/12 0535  WBC 5.2 4.6 5.1  HGB 12.8 11.8* 11.4*  HCT 37.9 35.1* 34.1*  PLT 215 209 207   BMET  Recent Labs  10/23/12 0915 10/23/12 1455 10/24/12 0535  NA 137  --  136  K 4.0  --  4.0  CL 99  --  104  CO2 27  --  22  GLUCOSE 114*  --  137*  BUN 9  --  7  CREATININE 0.73 0.81 0.59  CALCIUM 9.4  --  8.9   LFT  Recent Labs  10/24/12 0535  PROT 6.6  ALBUMIN 3.5  AST 12  ALT 5  ALKPHOS 54  BILITOT 0.3   PT/INR  Recent Labs  10/23/12 1455  LABPROT 12.5  INR 0.95    Medications: I have reviewed the patient's current medications.  Assessment/Plan: 1) Crohn's disease of the small intestine: we need to get her old records and formulate a definite treatment plan. Continue present care.   LOS: 2 days   Maxi Rodas 10/25/2012, 6:17 PM

## 2012-10-25 NOTE — Progress Notes (Signed)
[facility] PROGRESS NOTE  Andrea Wilkins UYE:[id number] DOB: January 06, 1966 DOA: 10/23/2012  PCP: No primary provider on file.  Brief HPI: 47yo AAF with past history of Crohn's diagnosed in 2008 and was in remission until earlier this year when she started having symptoms again. She used to live in [place], [place] where she was initially diagnosed. She presented with abdominal pain, nausea and vomiting and underwent Ct which revealed findings of crohn's.  Past medical history:  Past Medical History  Diagnosis Date  . Asthma   . Crohn's disease     Consultants: Dr. Collene Mares (GI)  Procedures: None  Antibiotics: None  Subjective: Patient feels better. Pain is 6-7/10. No nausea/vomiting. Passing flatus and some stool without blood.  Objective: Vital Signs  Filed Vitals:   10/24/12 1404 10/24/12 2159 10/25/12 0500 10/25/12 0903  BP: 133/88 130/76 134/84   Pulse: 91 77 70   Temp: 98.1 F (36.7 C) 98.2 F (36.8 C) 98.3 F (36.8 C)   TempSrc: Oral Oral Oral   Resp: 18 16 16    Height:      Weight:    92.1 kg (203 lb 0.7 oz)  SpO2: 96% 97% 97%     Intake/Output Summary (Last 24 hours) at 10/25/12 1119 Last data filed at 10/25/12 0606  Gross per 24 hour  Intake 781.17 ml  Output      0 ml  Net 781.17 ml   Filed Weights   10/23/12 1430 10/24/12 0602 10/25/12 0903  Weight: 91.173 kg (201 lb) 88.3 kg (194 lb 10.7 oz) 92.1 kg (203 lb 0.7 oz)    General appearance: alert, cooperative, appears stated age and no distress Resp: clear to auscultation bilaterally Cardio: regular rate and rhythm, S1, S2 normal, no murmur, click, rub or gallop GI: Soft, mildly tender around umbilicus, no rebound rigidity. BS present, no masses or organomegaly. Neurologic: Alert and oriented x 3. No focal deficits.  Lab Results:  Basic Metabolic Panel:  Recent Labs Lab 10/23/12 0915 10/23/12 1455 10/24/12 0535  NA 137  --  136  K 4.0  --  4.0  CL 99  --  104  CO2 27  --  22  GLUCOSE 114*  --   137*  BUN 9  --  7  CREATININE 0.73 0.81 0.59  CALCIUM 9.4  --  8.9  MG  --  1.9  --    Liver Function Tests:  Recent Labs Lab 10/23/12 0915 10/24/12 0535  AST 13 12  ALT 8 5  ALKPHOS 60 54  BILITOT 0.4 0.3  PROT 7.4 6.6  ALBUMIN 4.0 3.5    Recent Labs Lab 10/23/12 0915  LIPASE 16   CBC:  Recent Labs Lab 10/23/12 0915 10/23/12 1455 10/24/12 0535  WBC 5.2 4.6 5.1  NEUTROABS 4.0  --   --   HGB 12.8 11.8* 11.4*  HCT 37.9 35.1* 34.1*  MCV 87.1 87.3 86.8  PLT 215 209 207    Studies/Results: No results found.  Medications:  Scheduled: . enoxaparin (LOVENOX) injection  40 mg Subcutaneous Q24H  . lisinopril  20 mg Oral Daily  . methylPREDNISolone (SOLU-MEDROL) injection  60 mg Intravenous Q12H  . pantoprazole  40 mg Oral Q0600   Continuous:   UOH:[id number], acetaminophen, albuterol, alum & mag hydroxide-simeth, gi cocktail, morphine injection, ondansetron (ZOFRAN) IV, ondansetron, oxyCODONE  Assessment/Plan:  Principal Problem:   Crohn disease Active Problems:   Hypertension   Abdominal pain   Asthma    Probable Acute Crohn's disease flare  May be secondary to patient having run out of her maintenance medications over approximately a month ago. She seems to be improving. Continue steroids. Appreciate GI input. Advance to full liquids.  History of Hypertension  Stable. Resume home dose lisinopril.   History of Asthma  Stable. Nebs as needed.   Code Status:  Full Code DVT Prophylaxis:   Lovenox Family Communication: Discussed with patient  Disposition Plan: Not ready for discharge. Ambulate.    LOS: 2 days   [facility] Pager [phone] 10/25/2012, 11:19 AM  If 8PM-8AM, please contact night-coverage at [email or web address], password [facility]

## 2012-10-26 MED ORDER — LISINOPRIL 40 MG PO TABS
40.0000 mg | ORAL_TABLET | Freq: Every day | ORAL | Status: DC
  Administered 2012-10-27: 40 mg via ORAL
  Filled 2012-10-26: qty 1

## 2012-10-26 MED ORDER — LISINOPRIL 20 MG PO TABS
20.0000 mg | ORAL_TABLET | Freq: Once | ORAL | Status: AC
  Administered 2012-10-26: 20 mg via ORAL
  Filled 2012-10-26: qty 1

## 2012-10-26 MED ORDER — HYDRALAZINE HCL 20 MG/ML IJ SOLN
10.0000 mg | Freq: Four times a day (QID) | INTRAMUSCULAR | Status: DC | PRN
  Administered 2012-10-26: 10 mg via INTRAVENOUS
  Filled 2012-10-26 (×2): qty 1

## 2012-10-26 NOTE — Progress Notes (Signed)
[facility] PROGRESS NOTE  Andrea Wilkins ZOX:[id number] DOB: [birth year] DOA: 10/23/2012  PCP: No primary provider on file.  Brief HPI: 47yo AAF with past history of Crohn's diagnosed in 2008 and was in remission until earlier this year when she started having symptoms again. She used to live in [place], [place] where she was initially diagnosed. She presented with abdominal pain, nausea and vomiting and underwent Ct which revealed findings of crohn's.  Past medical history:  Past Medical History  Diagnosis Date  . Asthma   . Crohn's disease     Consultants: Dr. Collene Mares (GI)  Procedures: None  Antibiotics: None  Subjective: Patient states she had yogurt yesterday after which her pain got worse. Still at 7/10. No nausea or vomiting. Passing flatus and some stool without blood.  Objective: Vital Signs  Filed Vitals:   10/25/12 2200 10/26/12 0600 10/26/12 0828 10/26/12 0931  BP: 147/91 179/99 168/113 171/104  Pulse: 76 66  71  Temp: 98.5 F (36.9 C) 98.3 F (36.8 C)    TempSrc: Oral Oral    Resp: 18 18    Height:      Weight:      SpO2: 97% 98%      Intake/Output Summary (Last 24 hours) at 10/26/12 1019 Last data filed at 10/26/12 0000  Gross per 24 hour  Intake 1942.5 ml  Output      0 ml  Net 1942.5 ml   Filed Weights   10/23/12 1430 10/24/12 0602 10/25/12 0903  Weight: 91.173 kg (201 lb) 88.3 kg (194 lb 10.7 oz) 92.1 kg (203 lb 0.7 oz)    General appearance: alert, cooperative, appears stated age and no distress Resp: clear to auscultation bilaterally Cardio: regular rate and rhythm, S1, S2 normal, no murmur, click, rub or gallop GI: Soft, mildly tender around umbilicus, no rebound rigidity. BS present, no masses or organomegaly. Neurologic: Alert and oriented x 3. No focal deficits.  Lab Results:  Basic Metabolic Panel:  Recent Labs Lab 10/23/12 0915 10/23/12 1455 10/24/12 0535  NA 137  --  136  K 4.0  --  4.0  CL 99  --  104  CO2 27  --  22   GLUCOSE 114*  --  137*  BUN 9  --  7  CREATININE 0.73 0.81 0.59  CALCIUM 9.4  --  8.9  MG  --  1.9  --    Liver Function Tests:  Recent Labs Lab 10/23/12 0915 10/24/12 0535  AST 13 12  ALT 8 5  ALKPHOS 60 54  BILITOT 0.4 0.3  PROT 7.4 6.6  ALBUMIN 4.0 3.5    Recent Labs Lab 10/23/12 0915  LIPASE 16   CBC:  Recent Labs Lab 10/23/12 0915 10/23/12 1455 10/24/12 0535  WBC 5.2 4.6 5.1  NEUTROABS 4.0  --   --   HGB 12.8 11.8* 11.4*  HCT 37.9 35.1* 34.1*  MCV 87.1 87.3 86.8  PLT 215 209 207    Studies/Results: No results found.  Medications:  Scheduled: . enoxaparin (LOVENOX) injection  40 mg Subcutaneous Q24H  . lisinopril  20 mg Oral Once  . [START ON 10/27/2012] lisinopril  40 mg Oral Daily  . methylPREDNISolone (SOLU-MEDROL) injection  60 mg Intravenous Q12H  . pantoprazole  40 mg Oral Q0600   Continuous:   WJX:BJYNWGNFAOZHY, acetaminophen, albuterol, alum & mag hydroxide-simeth, gi cocktail, hydrALAZINE, morphine injection, ondansetron (ZOFRAN) IV, ondansetron, oxyCODONE  Assessment/Plan:  Principal Problem:   Crohn disease Active Problems:   Hypertension  Abdominal pain   Asthma    Probable Acute Crohn's disease flare  May be secondary to patient having run out of her maintenance medications approximately a month ago. Slow to improve. Continue steroids. Appreciate GI input. Continue full liquids. Should be able to taper steroids soon.  History of Hypertension  BP running high presumably made worse by steroids. Increase dose of Lisinopril. Hydralazine PRN.   History of Asthma  Stable. Nebs as needed.   Code Status:  Full Code DVT Prophylaxis:   Lovenox Family Communication: Discussed with patient  Disposition Plan: Not ready for discharge. Ambulate. Recheck labs in AM.    LOS: 3 days   [facility] Pager [phone] 10/26/2012, 10:19 AM  If 8PM-8AM, please contact night-coverage at [email or web address], password  [facility]

## 2012-10-26 NOTE — Progress Notes (Signed)
Subjective: No acute events.  Last evening he did have abdominal pain after eating yogurt, but now she is better.  Passing flatus.  Objective: Vital signs in last 24 hours: Temp:  [98.3 F (36.8 C)-98.6 F (37 C)] 98.3 F (36.8 C) (09/30 0600) Pulse Rate:  [66-80] 71 (09/30 0931) Resp:  [18] 18 (09/30 0600) BP: (147-179)/(90-113) 175/112 mmHg (09/30 1112) SpO2:  [95 %-98 %] 98 % (09/30 0600) Last BM Date: 10/24/12  Intake/Output from previous day: 09/29 0701 - 09/30 0700 In: 2302.5 [P.O.:960; I.V.:1342.5] Out: -  Intake/Output this shift:    General appearance: alert and no distress GI: soft, non-tender; bowel sounds normal; no masses,  no organomegaly  Lab Results:  Recent Labs  10/23/12 1455 10/24/12 0535  WBC 4.6 5.1  HGB 11.8* 11.4*  HCT 35.1* 34.1*  PLT 209 207   BMET  Recent Labs  10/23/12 1455 10/24/12 0535  NA  --  136  K  --  4.0  CL  --  104  CO2  --  22  GLUCOSE  --  137*  BUN  --  7  CREATININE 0.81 0.59  CALCIUM  --  8.9   LFT  Recent Labs  10/24/12 0535  PROT 6.6  ALBUMIN 3.5  AST 12  ALT 5  ALKPHOS 54  BILITOT 0.3   PT/INR  Recent Labs  10/23/12 1455  LABPROT 12.5  INR 0.95   Hepatitis Panel No results found for this basename: HEPBSAG, HCVAB, HEPAIGM, HEPBIGM,  in the last 72 hours C-Diff No results found for this basename: CDIFFTOX,  in the last 72 hours Fecal Lactopherrin No results found for this basename: FECLLACTOFRN,  in the last 72 hours  Studies/Results: No results found.  Medications:  Scheduled: . enoxaparin (LOVENOX) injection  40 mg Subcutaneous Q24H  . [START ON 10/27/2012] lisinopril  40 mg Oral Daily  . methylPREDNISolone (SOLU-MEDROL) injection  60 mg Intravenous Q12H  . pantoprazole  40 mg Oral Q0600   Continuous:   Assessment/Plan: 1) Crohn's disease.   I spoke with Dr. Collene Mares and it appears that Humira can be provided to her through an ambassador program.  This will have to take place as an  outpatient.  Plan: 1) Continue with steroids.  2) Advance diet as necessary.  LOS: 3 days   Andrea Wilkins D 10/26/2012, 12:21 PM

## 2012-10-27 LAB — CBC
HCT: 38.2 % (ref 36.0–46.0)
Hemoglobin: 13.2 g/dL (ref 12.0–15.0)
MCH: 29.6 pg (ref 26.0–34.0)
MCHC: 34.6 g/dL (ref 30.0–36.0)
MCV: 85.7 fL (ref 78.0–100.0)
Platelets: 292 10*3/uL (ref 150–400)
RBC: 4.46 MIL/uL (ref 3.87–5.11)
RDW: 12.5 % (ref 11.5–15.5)
WBC: 6.7 10*3/uL (ref 4.0–10.5)

## 2012-10-27 LAB — BASIC METABOLIC PANEL
BUN: 9 mg/dL (ref 6–23)
CO2: 26 mEq/L (ref 19–32)
Calcium: 9.4 mg/dL (ref 8.4–10.5)
Chloride: 99 mEq/L (ref 96–112)
Creatinine, Ser: 0.69 mg/dL (ref 0.50–1.10)
GFR calc Af Amer: >90 mL/min (ref >90)
GFR calc non Af Amer: >90 mL/min (ref >90)
Glucose, Bld: 133 mg/dL — ABNORMAL HIGH (ref 70–99)
Potassium: 3.7 mEq/L (ref 3.5–5.1)
Sodium: 135 mEq/L (ref 135–145)

## 2012-10-27 MED ORDER — PREDNISONE 20 MG PO TABS
40.0000 mg | ORAL_TABLET | Freq: Every day | ORAL | Status: DC
  Filled 2012-10-27: qty 2

## 2012-10-27 MED ORDER — PREDNISONE 20 MG PO TABS: ORAL_TABLET | ORAL | Status: DC

## 2012-10-27 MED ORDER — PREDNISONE 50 MG PO TABS
60.0000 mg | ORAL_TABLET | Freq: Every day | ORAL | Status: DC
  Administered 2012-10-27: 60 mg via ORAL
  Filled 2012-10-27 (×2): qty 1

## 2012-10-27 NOTE — Discharge Summary (Signed)
Physician Discharge Summary  Andrea Wilkins TML:[id number] DOB: 1965/09/27 DOA: 10/23/2012  PCP: No primary provider on file.  Admit date: 10/23/2012 Discharge date: 10/27/2012  Time spent: 35  Recommendations for Outpatient Follow-up:  1.  patient recommended to have tapering dosages of steroids  2. Patient needs to have comprehensive reevaluation by Dr. Collene Mares in OP setting   Discharge Diagnoses:  Principal Problem:   Crohn disease Active Problems:   Hypertension   Abdominal pain   Asthma   Discharge Condition:  good   Diet recommendation:  regular   Filed Weights   10/24/12 0602 10/25/12 0903 10/27/12 0500  Weight: 88.3 kg (194 lb 10.7 oz) 92.1 kg (203 lb 0.7 oz) 83.8 kg (184 lb 11.9 oz)    History of present illness:   this 47 year old female with questionable history of Crohn's/questionable history bowel reconstruction was admitted 10/23/2012 . She has had most from medical care in [facility] and was admitted for what was thought to be a Crohn's flare please see below   Hospital Course:  Probable Acute Crohn's disease flare? May be secondary to patient having run out of her maintenance medications approximately a month ago. Slow to improve. Placed on oral steroids 10/1 with tapering instructions as per gastroenterology I did obtain records from Dr. Leanna Battles, her prior gastroenterologist at [facility] however, the capsule endoscopy performed at his Center did not reveal Crohn's I discussed patient's case on 10/28/2038 with Dr. Collene Mares who recommended tapering dosages of steroids 10 mg every 10 days from 40 mg and I have counseled the patient extensively about going to see Dr. for reevaluation of her issuesMann  History of Hypertension  BP running high presumably made worse by steroids. Increase dose of Lisinopril.   History of Asthma  Stable. Nebs as needed as an outpatient    Procedures:   CT abdomen pelvis    Consultations:   gastroenterology    Discharge Exam: Filed Vitals:   10/27/12 0500  BP: 147/92  Pulse: 77  Temp: 97.6 F (36.4 C)  Resp: 18    doing well Tolerating diet Ready to go home Nondominant pain no nausea no vomiting no chest pain General:  EOMI, NCAT  Cardiovascular:  S1-S2 no murmur rub or gallop  Respiratory:  clinically clear   Discharge Instructions  Discharge Orders   Future Appointments Provider Department Dept Phone   10/29/2012 12:30 PM [facility] Counselor [place] [phone]   11/05/2012 9:30 AM [facility] Covering Provider [place] [phone]   Future Orders Complete By Expires   Call MD for:  difficulty breathing, headache or visual disturbances  As directed    Call MD for:  persistant nausea and vomiting  As directed    Call MD for:  redness, tenderness, or signs of infection (pain, swelling, redness, odor or green/yellow discharge around incision site)  As directed    Call MD for:  As directed    Diet - low sodium heart healthy  As directed    Discharge instructions  As directed    Comments:     Please followup with Dr. Collene Mares in one to 2 weeks   Increase activity slowly  As directed        Medication List         lisinopril-hydrochlorothiazide 20-12.5 MG per tablet  Commonly known as:  PRINZIDE,ZESTORETIC  Take 1 tablet by mouth daily.     predniSONE 20 MG tablet  Commonly  known as:  DELTASONE  Take 2 tablets by mouth daily until 11/06/12, then one tablet daily until 11/16/12 OR until Dr. Collene Mares tells you otherwise       No Known Allergies     Follow-up Information   Follow up with [name],[name], MD. Schedule an appointment as soon as possible for a visit in 10 days.   Specialty:  Gastroenterology   Contact information:   [address] [place] [address] [phone]        The results of significant diagnostics from this hospitalization (including imaging, microbiology,  ancillary and laboratory) are listed below for reference.    Significant Diagnostic Studies: Ct Abdomen Pelvis W Contrast  10/23/2012   CLINICAL DATA:  Upper abdominal pain, vomiting, prior hysterectomy. Recently diagnosed with Crohn's disease.  EXAM: CT ABDOMEN AND PELVIS WITH CONTRAST  TECHNIQUE: Multidetector CT imaging of the abdomen and pelvis was performed using the standard protocol following bolus administration of intravenous contrast.  CONTRAST:  24m OMNIPAQUE IOHEXOL 300 MG/ML SOLN, 101mOMNIPAQUE IOHEXOL 300 MG/ML SOLN  COMPARISON:  09/21/2012  FINDINGS: Lung bases are clear.  8 mm probable cyst in the posterior segment right hepatic lobe.  Spleen, pancreas, and adrenal glands are within normal limits.  Gallbladder is unremarkable. No intrahepatic or extrahepatic ductal dilatation.  Kidneys are within normal limits. No hydronephrosis.  Prior distal small bowel resection with anastomosis in the right mid abdomen (series 2/ image 564 No evidence of bowel obstruction. Abnormal loop of thick-walled small bowel in the right mid abdomen with associated inflammatory changes/mesenteric fluid (series 2/ image 36), compatible with active inflammatory Crohn's disease. Normal appendix. Left colon is decompressed.  No evidence of abdominal aortic aneurysm.  Small volume pelvic ascites.  No suspicious abdominopelvic lymphadenopathy.  Status post hysterectomy. Left ovary is unremarkable. No right adnexal mass.  Mild degenerative changes of the lower thoracic spine.  IMPRESSION: Active inflammatory Crohn's disease involving an abnormal loop of bowel in the right mid abdomen.  Mild mesenteric fluid/edema with small volume pelvic ascites.  Prior distal small bowel resection. No evidence of bowel obstruction.   Electronically Signed   By: SrJulian Hy.D.   On: 10/23/2012 10:36    Microbiology: Recent Results (from the past 240 hour(s))  URINE CULTURE     Status: None   Collection Time    10/23/12 10:06  AM      Result Value Range Status   Specimen Description URINE, CLEAN CATCH   Final   Special Requests NONE   Final   Culture  Setup Time     Final   Value: 10/23/2012 18:35     Performed at [facility]   Final   Value: 1,000 COLONIES/ML     Performed at [facility] Culture     Final   Value: INSIGNIFICANT GROWTH     Performed at [facility] Report Status 10/24/2012 FINAL   Final     Labs: Basic Metabolic Panel:  Recent Labs Lab 10/23/12 0915 10/23/12 1455 10/24/12 0535 10/27/12 0525  NA 137  --  136 135  K 4.0  --  4.0 3.7  CL 99  --  104 99  CO2 27  --  22 26  GLUCOSE 114*  --  137* 133*  BUN 9  --  7 9  CREATININE 0.73 0.81 0.59 0.69  CALCIUM 9.4  --  8.9 9.4  MG  --  1.9  --   --    Liver Function Tests:  Recent Labs Lab 10/23/12 0915 10/24/12 0535  AST 13 12  ALT 8 5  ALKPHOS 60 54  BILITOT 0.4 0.3  PROT 7.4 6.6  ALBUMIN 4.0 3.5    Recent Labs Lab 10/23/12 0915  LIPASE 16   No results found for this basename: AMMONIA,  in the last 168 hours CBC:  Recent Labs Lab 10/23/12 0915 10/23/12 1455 10/24/12 0535 10/27/12 0525  WBC 5.2 4.6 5.1 6.7  NEUTROABS 4.0  --   --   --   HGB 12.8 11.8* 11.4* 13.2  HCT 37.9 35.1* 34.1* 38.2  MCV 87.1 87.3 86.8 85.7  PLT 215 209 207 292   Cardiac Enzymes: No results found for this basename: CKTOTAL, CKMB, CKMBINDEX, TROPONINI,  in the last 168 hours BNP: BNP (last 3 results) No results found for this basename: PROBNP,  in the last 8760 hours CBG: No results found for this basename: GLUCAP,  in the last 168 hours     Signed:  Nita Sells  [facility] 10/27/2012, 2:29 PM

## 2012-10-29 ENCOUNTER — Ambulatory Visit: Payer: Self-pay | Attending: Internal Medicine

## 2012-11-05 ENCOUNTER — Ambulatory Visit: Payer: No Typology Code available for payment source | Attending: Internal Medicine | Admitting: Internal Medicine

## 2012-11-05 VITALS — BP 127/88 | HR 83 | Temp 99.4°F | Resp 17 | Wt 201.2 lb

## 2012-11-05 DIAGNOSIS — K5090 Crohn's disease, unspecified, without complications: Secondary | ICD-10-CM

## 2012-11-05 DIAGNOSIS — J45909 Unspecified asthma, uncomplicated: Secondary | ICD-10-CM | POA: Insufficient documentation

## 2012-11-05 DIAGNOSIS — I10 Essential (primary) hypertension: Secondary | ICD-10-CM | POA: Insufficient documentation

## 2012-11-05 LAB — LIPID PANEL
Cholesterol: 232 mg/dL — ABNORMAL HIGH (ref 0–200)
HDL: 58 mg/dL (ref >39)
LDL Cholesterol: 135 mg/dL — ABNORMAL HIGH (ref 0–99)
Total CHOL/HDL Ratio: 4 Ratio
Triglycerides: 195 mg/dL — ABNORMAL HIGH (ref <150)
VLDL: 39 mg/dL (ref 0–40)

## 2012-11-05 LAB — TSH: TSH: 0.983 u[IU]/mL (ref 0.350–4.500)

## 2012-11-05 NOTE — Patient Instructions (Signed)
Crohn's Disease Crohn's disease is a long-term (chronic) soreness and redness (inflammation) of the intestines (bowel). It can affect any portion of the digestive tract, from the mouth to the anus. It can also cause problems outside the digestive tract. Crohn's disease is closely related to a disease called ulcerative colitis (together, these two diseases are called inflammatory bowel disease).  CAUSES  The cause of Crohn's disease is not known. One Link Snuffer is that, in an easily affected (susceptible) person, the immune system is triggered to attack the body's own digestive tissue. Crohn's disease runs in families. It seems to be more common in certain geographic areas and amongst certain races. There are no clear-cut dietary causes.  SYMPTOMS  Crohn's disease can cause many different symptoms since it can affect many different parts of the body. Symptoms include:  Fatigue.  Weight loss.  Chronic diarrhea, sometime bloody.  Abdominal pain and cramps.  Fever.  Ulcers or canker sores in the mouth or rectum.  Anemia (low red blood cells).  Arthritis, skin problems, and eye problems may occur. Complications of Crohn's disease can include:  Series of holes (perforation) of the bowel.  Portions of the intestines sticking to each other (adhesions).  Obstruction of the bowel.  Fistula formation, typically in the rectal area but also in other areas. A fistula is an opening between the bowels and the outside, or between the bowels and another organ.  A painful crack in the mucous membrane of the anus (rectal fissure). DIAGNOSIS  Your caregiver may suspect Crohn's disease based on your symptoms and an exam. Blood tests may confirm that there is a problem. You may be asked to submit a stool specimen for examination. X-rays and CT scans may be necessary. Ultimately, the diagnosis is usually made after a procedure that uses a flexible tube that is inserted via your mouth or your anus. This is done  under sedation and is called either an upper endoscopy or colonoscopy. With these tests, the specialist can take tiny tissue samples and remove them from the inside of the bowel (biopsy). Examination of this biopsy tissue under a microscope can reveal Crohn's disease as the cause of your symptoms. Due to the many different forms that Crohn's disease can take, symptoms may be present for several years before a diagnosis is made. HOME CARE INSTRUCTIONS   There is no cure for Crohn's disease. The best treatment is frequent checkups with your caregiver.  Symptoms such as diarrhea can be controlled with medications. Avoid foods that have a laxative effect such as fresh fruit, vegetables and dairy products. During flare ups, you can rest your bowel by refraining from solid foods. Drink clear liquids frequently during the day (electrolyte or re-hydrating fluids are best. Your caregiver can help you with suggestions). Drink often to prevent loss of body fluids (dehydration). When diarrhea has cleared, eat small meals and more frequently. Avoid food additives and stimulants such as caffeine (coffee, tea, or chocolate). Enzyme supplements may help if you develop intolerance to a sugar in dairy products (lactose). Ask your caregiver or dietitian about specific dietary instructions.  Try to maintain a positive attitude. Learn relaxation techniques such as self hypnosis, mental imaging, and muscle relaxation.  If possible, avoid stresses which can aggravate your condition.  Exercise regularly.  Follow your diet.  Always get plenty of rest. SEEK MEDICAL CARE IF:   Your symptoms fail to improve after a week or two of new treatment.  You experience continued weight loss.  You have  ongoing crampy digestion or loose bowels.  You develop a new skin rash, skin sores, or eye problems. SEEK IMMEDIATE MEDICAL CARE IF:   You have worsening of your symptoms or develop new symptoms.  You have a fever.  You  develop bloody diarrhea.  You develop severe abdominal pain. MAKE SURE YOU:   Understand these instructions.  Will watch your condition.  Will get help right away if you are not doing well or get worse. Document Released: 10/23/2004 Document Revised: 04/07/2011 Document Reviewed: 09/21/2006 [facility] Patient Information 2014 [place], [place].

## 2012-11-05 NOTE — Progress Notes (Signed)
Patient here to establish care History of crones disease

## 2012-11-05 NOTE — Progress Notes (Signed)
Patient ID: Andrea Wilkins, female   DOB: 09/29/1965, 47 y.o.   MRN: [id number]   CC:  Regular followup  HPI: Patient is 47 year old very pleasant female with history of hypertension and Crohn's disease who presents to clinic for followup. She reports feeling well this morning, denies specific concerns such as chest pain or shortness of breath, no specific abdominal concerns, she reports compliance with medicines and appointment with gastroenterologist. She does not need any refill on today's examination.  No Known Allergies Past Medical History  Diagnosis Date  . Asthma   . Crohn's disease    Current Outpatient Prescriptions on File Prior to Visit  Medication Sig Dispense Refill  . lisinopril-hydrochlorothiazide (PRINZIDE,ZESTORETIC) 20-12.5 MG per tablet Take 1 tablet by mouth daily.      . predniSONE (DELTASONE) 20 MG tablet Take 2 tablets by mouth daily until 11/06/12, then one tablet daily until 11/16/12 OR until Dr. Collene Mares tells you otherwise  30 tablet  0   No current facility-administered medications on file prior to visit.   Family History  Problem Relation Age of Onset  . Hypertension Mother   . Diabetes Sister   . Hypertension Sister    History   Social History  . Marital Status: Single    Spouse Name: N/A    Number of Children: N/A  . Years of Education: N/A   Occupational History  . Not on file.   Social History Main Topics  . Smoking status: Never Smoker   . Smokeless tobacco: Never Used  . Alcohol Use: No  . Drug Use: No  . Sexual Activity: No   Other Topics Concern  . Not on file   Social History Narrative  . No narrative on file    Review of Systems  Constitutional: Negative for fever, chills, diaphoresis, activity change, appetite change and fatigue.  HENT: Negative for ear pain, nosebleeds, congestion, facial swelling, rhinorrhea, neck pain, neck stiffness and ear discharge.   Eyes: Negative for pain, discharge, redness, itching and visual  disturbance.  Respiratory: Negative for cough, choking, chest tightness, shortness of breath, wheezing and stridor.   Cardiovascular: Negative for chest pain, palpitations and leg swelling.  Gastrointestinal: Negative for abdominal distention.  Genitourinary: Negative for dysuria, urgency, frequency, hematuria, flank pain, decreased urine volume, difficulty urinating and dyspareunia.  Musculoskeletal: Negative for back pain, joint swelling, arthralgias and gait problem.  Neurological: Negative for dizziness, tremors, seizures, syncope, facial asymmetry, speech difficulty, weakness, light-headedness, numbness and headaches.  Hematological: Negative for adenopathy. Does not bruise/bleed easily.  Psychiatric/Behavioral: Negative for hallucinations, behavioral problems, confusion, dysphoric mood, decreased concentration and agitation.    Objective:   Filed Vitals:   11/05/12 0930  BP: 127/88  Pulse: 83  Temp: 99.4 F (37.4 C)  Resp: 17    Physical Exam  Constitutional: Appears well-developed and well-nourished. No distress.  CVS: RRR, S1/S2 +, no murmurs, no gallops, no carotid bruit.  Pulmonary: Effort and breath sounds normal, no stridor, rhonchi, wheezes, rales.  Abdominal: Soft. BS +,  no distension, tenderness, rebound or guarding.   Lab Results  Component Value Date   WBC 6.7 10/27/2012   HGB 13.2 10/27/2012   HCT 38.2 10/27/2012   MCV 85.7 10/27/2012   PLT 292 10/27/2012   Lab Results  Component Value Date   CREATININE 0.69 10/27/2012   BUN 9 10/27/2012   NA 135 10/27/2012   K 3.7 10/27/2012   CL 99 10/27/2012   CO2 26 10/27/2012    No results found  for this basename: HGBA1C   Lipid Panel  No results found for this basename: chol, trig, hdl, cholhdl, vldl, ldlcalc       Assessment and plan:   Patient Active Problem List   Diagnosis Date Noted  . Crohn disease - continue to follow up with her gastroenterologist, continuing prednisone  10/23/2012  . Asthma - stable and  controlled, patient has oxygen saturation at target range  10/23/2012  . Hypertension - reasonably controlled, I have advised patient to continue checking her blood pressure regularly and to call us back if the numbers are persistently higher than 140/90 so we can readjust her regimen is indicated  06/14/2012

## 2013-01-06 ENCOUNTER — Encounter: Payer: No Typology Code available for payment source | Admitting: Obstetrics & Gynecology

## 2013-01-27 DIAGNOSIS — M1990 Unspecified osteoarthritis, unspecified site: Secondary | ICD-10-CM

## 2013-01-27 HISTORY — DX: Unspecified osteoarthritis, unspecified site: M19.90

## 2013-02-11 ENCOUNTER — Other Ambulatory Visit: Payer: Self-pay

## 2013-02-11 ENCOUNTER — Telehealth: Payer: Self-pay

## 2013-02-11 MED ORDER — VALSARTAN-HYDROCHLOROTHIAZIDE 160-12.5 MG PO TABS: 1.0000 | ORAL_TABLET | Freq: Every day | ORAL | Status: DC

## 2013-02-11 NOTE — Telephone Encounter (Signed)
Patient called stating her blood pressure medication causes her to cough Constantly.  Spoke with Dr Doreene Burke- changed medication to  diovan 160-12.5 mg daily Prescription sent to [facility]

## 2013-03-01 ENCOUNTER — Encounter (HOSPITAL_COMMUNITY): Payer: Self-pay | Admitting: Emergency Medicine

## 2013-03-01 ENCOUNTER — Emergency Department (INDEPENDENT_AMBULATORY_CARE_PROVIDER_SITE_OTHER)
Admission: EM | Admit: 2013-03-01 | Discharge: 2013-03-01 | Disposition: A | Discharge status: Home / Self Care | Payer: Self-pay | Source: Home / Self Care

## 2013-03-01 DIAGNOSIS — M7051 Other bursitis of knee, right knee: Secondary | ICD-10-CM

## 2013-03-01 DIAGNOSIS — M76899 Other specified enthesopathies of unspecified lower limb, excluding foot: Secondary | ICD-10-CM

## 2013-03-01 MED ORDER — TRIAMCINOLONE ACETONIDE 40 MG/ML IJ SUSP
INTRAMUSCULAR | Status: AC
  Filled 2013-03-01: qty 1

## 2013-03-01 NOTE — ED Provider Notes (Signed)
Medical screening examination/treatment/procedure(s) were performed by a resident physician and as supervising physician I was immediately available for consultation/collaboration.  Philipp Deputy, M.D.  Harden Mo, MD 03/01/13 2214

## 2013-03-01 NOTE — ED Provider Notes (Signed)
CSN: [id number]     Arrival date & time 03/01/13  1712 History   None    Chief Complaint  Patient presents with  . Knee Pain   (Consider location/radiation/quality/duration/timing/severity/associated sxs/prior Treatment) HPI  48 year old F with knee pain.   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 Asthma   . Crohn's disease    Past Surgical History  Procedure Laterality Date  . Breast surgery    . Cesarean section    . Small intestine surgery    . Abdominal hysterectomy     Family History  Problem Relation Age of Onset  . Hypertension Mother   . Diabetes Sister   . Hypertension Sister    History  Substance Use Topics  . Smoking status: Never Smoker   . Smokeless tobacco: Never Used  . Alcohol Use: No   OB History   Grav Para Term Preterm Abortions TAB SAB Ect Mult Living                 Review of Systems No fever, chills, rash Allergies  Review of patient's allergies indicates no known allergies.  Home Medications   Current Outpatient Rx  Name  Route  Sig  Dispense  Refill  . lisinopril-hydrochlorothiazide (PRINZIDE,ZESTORETIC) 20-12.5 MG per tablet   Oral   Take 1 tablet by mouth daily.         . predniSONE (DELTASONE) 20 MG tablet      Take 2 tablets by mouth daily until 11/06/12, then one tablet daily until 11/16/12 OR until Dr. Collene Mares tells you otherwise   30 tablet   0   . valsartan-hydrochlorothiazide (DIOVAN HCT) 160-12.5 MG per tablet   Oral   Take 1  tablet by mouth daily.   30 tablet   2    BP 155/91  Pulse 89  Temp(Src) 98.2 F (36.8 C) (Oral)  Resp 16  SpO2 100% Physical Exam Gen: middle aged F, mild distress Knee Exam:  Laterality: right Appearance:  Edema: yes upper, anterior, lateral, medial  Tenderness: yes  entire area Range of Motion: Passive Extension: limited Flexion:limited Active Extension: normal Flexion: limited by pain Laxity: none Maneuvers: Lachman's: neg McMurray's: neg Patellar Compression: positive  Strength:  Quadricep: 4/5 limited due to pain Hamstring: 4/5 limited due to pain      ED Course  Procedures (including critical care time) Labs Review Labs Reviewed - No data to display Imaging Review No results found.    MDM   1. Suprapatellar bursitis of right knee    No clinical evidence of infection. Pt trying conservative measures without improvement so will provide steroid injection. Given lack of trauma and stability of the knee on exam there is no indication for imaging at this time. Follow up with sports medicine if still painful in 2 weeks.   Aspiration/Injection Procedure Note Sylvan Lahm [id number] [birth year]  Procedure: Injection Indications: pain and swelling  Procedure Details Consent: Risks of procedure as well as the alternatives  and risks of each were explained to the (patient/caregiver).  Consent for procedure obtained. Time Out: Verified patient identification, verified procedure, site/side was marked, verified correct patient position, special equipment/implants available, medications/allergies/relevent history reviewed, required imaging and test results available.  Performed   Local Anesthesia Used:Lidocaine 2% plain; 39m Steroid: Triamcinolone 40 mg  A sterile dressing was applied.  Patient did tolerate procedure well. Estimated blood loss: < 5 mL  WDewain Penning2/03/2013, 10:08 PM    EAngelica Ran MD 03/01/13 2212

## 2013-03-01 NOTE — ED Notes (Signed)
Patient has right knee pain, onset 1/25.  No known injury.  Patient walks a lot at work.  Patient reports no indication of any injury when right knee started swelling, "like someone pumping air into it".  Right knee is swollen.  Points to area below knee and both sides of knee cap as areas of soreness.

## 2013-03-01 NOTE — Discharge Instructions (Signed)
[name],   I think that you have a bursitis, which is inflammation of the fluid filled sacks around the knee. I think that you will get better with rest, ice and anti-inflammatory medications. The injection will hopefully help with the pain to allow you to function better at work. Please let us know if you develop signs or symptoms of infection. If you have continued pain, please call the [facility].   I hope you feel better soon.   Sincerely,   Dr. Maricela Bo   Bursitis Bursitis is a swelling and soreness (inflammation) of a fluid-filled sac (bursa) that overlies and protects a joint. It can be caused by injury, overuse of the joint, arthritis or infection. The joints most likely to be affected are the elbows, shoulders, hips and knees. HOME CARE INSTRUCTIONS   Apply ice to the affected area for 15-20 minutes each hour while awake for 2 days. Put the ice in a plastic bag and place a towel between the bag of ice and your skin.  Rest the injured joint as much as possible, but continue to put the joint through a full range of motion, 4 times per day. (The shoulder joint especially becomes rapidly "frozen" if not used.) When the pain lessens, begin normal slow movements and usual activities.  Only take over-the-counter or prescription medicines for pain, discomfort or fever as directed by your caregiver.  Your caregiver may recommend draining the bursa and injecting medicine into the bursa. This may help the healing process.  Follow all instructions for follow-up with your caregiver. This includes any orthopedic referrals, physical therapy and rehabilitation. Any delay in obtaining necessary care could result in a delay or failure of the bursitis to heal and chronic pain. SEEK IMMEDIATE MEDICAL CARE IF:   Your pain increases even during treatment.  You develop an oral temperature above 102 F (38.9 C) and have heat and inflammation over the involved bursa. MAKE  SURE YOU:   Understand these instructions.  Will watch your condition.  Will get help right away if you are not doing well or get worse. Document Released: 01/11/2000 Document Revised: 04/07/2011 Document Reviewed: 12/15/2008 [facility] Patient Information 2014 [place].

## 2013-03-25 ENCOUNTER — Other Ambulatory Visit: Payer: Self-pay | Admitting: Family Medicine

## 2013-03-25 ENCOUNTER — Ambulatory Visit: Payer: Self-pay | Admitting: Family Medicine

## 2013-03-25 VITALS — BP 138/82 | HR 83 | Temp 98.0°F | Resp 18 | Ht 62.0 in | Wt 213.0 lb

## 2013-03-25 DIAGNOSIS — R700 Elevated erythrocyte sedimentation rate: Secondary | ICD-10-CM

## 2013-03-25 DIAGNOSIS — R5381 Other malaise: Secondary | ICD-10-CM

## 2013-03-25 DIAGNOSIS — R5383 Other fatigue: Secondary | ICD-10-CM

## 2013-03-25 DIAGNOSIS — R52 Pain, unspecified: Secondary | ICD-10-CM

## 2013-03-25 DIAGNOSIS — R112 Nausea with vomiting, unspecified: Secondary | ICD-10-CM

## 2013-03-25 LAB — CK: Total CK: 86 U/L (ref 7–177)

## 2013-03-25 LAB — COMPREHENSIVE METABOLIC PANEL
ALT: 9 U/L (ref 0–35)
AST: 15 U/L (ref 0–37)
Albumin: 4.2 g/dL (ref 3.5–5.2)
Alkaline Phosphatase: 78 U/L (ref 39–117)
BUN: 7 mg/dL (ref 6–23)
CO2: 32 mEq/L (ref 19–32)
Calcium: 9.5 mg/dL (ref 8.4–10.5)
Chloride: 100 mEq/L (ref 96–112)
Creat: 0.73 mg/dL (ref 0.50–1.10)
Glucose, Bld: 118 mg/dL — ABNORMAL HIGH (ref 70–99)
Potassium: 3.8 mEq/L (ref 3.5–5.3)
Sodium: 140 mEq/L (ref 135–145)
Total Bilirubin: 0.4 mg/dL (ref 0.2–1.2)
Total Protein: 7.1 g/dL (ref 6.0–8.3)

## 2013-03-25 LAB — POCT CBC
Granulocyte percent: 63.2 %G (ref 37–80)
HCT, POC: 37.8 % (ref 37.7–47.9)
Hemoglobin: 11.3 g/dL — AB (ref 12.2–16.2)
Lymph, poc: 1.6 (ref 0.6–3.4)
MCH, POC: 27.8 pg (ref 27–31.2)
MCHC: 29.9 g/dL — AB (ref 31.8–35.4)
MCV: 93.2 fL (ref 80–97)
MID (cbc): 0.4 (ref 0–0.9)
MPV: 7 fL (ref 0–99.8)
POC Granulocyte: 3.4 (ref 2–6.9)
POC LYMPH PERCENT: 30.2 %L (ref 10–50)
POC MID %: 6.6 %M (ref 0–12)
Platelet Count, POC: 296 10*3/uL (ref 142–424)
RBC: 4.06 M/uL (ref 4.04–5.48)
RDW, POC: 13.9 %
WBC: 5.4 10*3/uL (ref 4.6–10.2)

## 2013-03-25 LAB — POCT SEDIMENTATION RATE: POCT SED RATE: 98 mm/hr — AB (ref 0–22)

## 2013-03-25 LAB — POCT INFLUENZA A/B
Influenza A, POC: NEGATIVE
Influenza B, POC: NEGATIVE

## 2013-03-25 NOTE — Progress Notes (Addendum)
[facility] [address], [place] [address] [phone]  Date:  03/25/2013   Name:  Andrea Wilkins   DOB:  [birth year]   MRN:  [id number]  PCP:  Andrea Chessman, MD    Chief Complaint: Generalized Body Aches, Nasal Congestion, Emesis and Diarrhea   History of Present Illness:  Andrea Wilkins is a 48 y.o. very pleasant female patient who presents with the following:  She is here today with illness.  She has been sick for about 2 weeks- her sx started with body aches and chills.  She has been taking OTC medications as needed.  She then developed sinus congestion, a cough. This am she noted vomiting and diarrhea.  Her main issue now is that she continues to have severe body aches and muscle soreness/ stiffness.  "I feel like I'm 48 years old."  She is not sure if she might have had a fever.   She threw up about 4 times today. Diarrhea 3x. She then was able to eat some crackers  She has noted a mild ST.  Her right ear is itchy.   No stomach sx until today.    She does have a dx of Chrohn disease but "I don't really believe I have it."  She had a partial small bowel resection due to adhesions noted during her hysterectomy- apparently not related to her Chroh's   She does not take any mediations for Crohn's disease at this time  She did take dayquil today   Patient Active Problem List   Diagnosis Date Noted  . Crohn disease 10/23/2012  . Abdominal pain 10/23/2012  . Asthma 10/23/2012  . Tension headache 06/14/2012  . Hypertension 06/14/2012  . Otitis media 06/14/2012    Past Medical History  Diagnosis Date  . Asthma   . Crohn's disease     Past Surgical History  Procedure Laterality Date  . Breast surgery    . Cesarean section    . Small intestine surgery    . Abdominal hysterectomy      History  Substance Use Topics  . Smoking status: Never Smoker   . Smokeless tobacco: Never Used  . Alcohol Use: No    Family History  Problem Relation Age  of Onset  . Hypertension Mother   . Diabetes Sister   . Hypertension Sister     No Known Allergies  Medication list has been reviewed and updated.  Current Outpatient Prescriptions on File Prior to Visit  Medication Sig Dispense Refill  . valsartan-hydrochlorothiazide (DIOVAN HCT) 160-12.5 MG per tablet Take 1 tablet by mouth daily.  30 tablet  2   No current facility-administered medications on file prior to visit.    Review of Systems:  As per HPI- otherwise negative.   Physical Examination: Filed Vitals:   03/25/13 1421  BP: 138/82  Pulse: 83  Temp: 98 F (36.7 C)  Resp: 18   Filed Vitals:   03/25/13 1421  Height: 5' 2"  (1.575 m)  Weight: 213 lb (96.616 kg)   Body mass index is 38.95 kg/(m^2). Ideal Body Weight: Weight in (lb) to have BMI = 25: 136.4  GEN: WDWN, NAD, Non-toxic, A & O x 3, obese, looks well but moving slowly due to muscle soreness  HEENT: Atraumatic, Normocephalic. Neck supple. No masses, No LAD. Ears and Nose: No external deformity. CV: RRR, No M/G/R. No JVD. No thrill. No extra heart sounds. PULM: CTA B, no wheezes, crackles, rhonchi. No retractions. No resp.  distress. No accessory muscle use. ABD: S, NT, ND, +BS. No rebound. No HSM.  Benign abdomen  EXTR: No c/c/e NEURO Normal gait.  PSYCH: Normally interactive. Conversant. Not depressed or anxious appearing.  Calm demeanor.   Results for orders placed in visit on 03/25/13  COMPREHENSIVE METABOLIC PANEL      Result Value Ref Range   Sodium 140  135 - 145 mEq/L   Potassium 3.8  3.5 - 5.3 mEq/L   Chloride 100  96 - 112 mEq/L   CO2 32  19 - 32 mEq/L   Glucose, Bld 118 (*) 70 - 99 mg/dL   BUN 7  6 - 23 mg/dL   Creat 0.73  0.50 - 1.10 mg/dL   Total Bilirubin 0.4  0.2 - 1.2 mg/dL   Alkaline Phosphatase 78  39 - 117 U/L   AST 15  0 - 37 U/L   ALT 9  0 - 35 U/L   Total Protein 7.1  6.0 - 8.3 g/dL   Albumin 4.2  3.5 - 5.2 g/dL   Calcium 9.5  8.4 - 10.5 mg/dL  CK      Result Value Ref  Range   Total CK 86  7 - 177 U/L  POCT INFLUENZA A/B      Result Value Ref Range   Influenza A, POC Negative     Influenza B, POC Negative    POCT CBC      Result Value Ref Range   WBC 5.4  4.6 - 10.2 K/uL   Lymph, poc 1.6  0.6 - 3.4   POC LYMPH PERCENT 30.2  10 - 50 %L   MID (cbc) 0.4  0 - 0.9   POC MID % 6.6  0 - 12 %M   POC Granulocyte 3.4  2 - 6.9   Granulocyte percent 63.2  37 - 80 %G   RBC 4.06  4.04 - 5.48 M/uL   Hemoglobin 11.3 (*) 12.2 - 16.2 g/dL   HCT, POC 37.8  37.7 - 47.9 %   MCV 93.2  80 - 97 fL   MCH, POC 27.8  27 - 31.2 pg   MCHC 29.9 (*) 31.8 - 35.4 g/dL   RDW, POC 13.9     Platelet Count, POC 296  142 - 424 K/uL   MPV 7.0  0 - 99.8 fL  POCT SEDIMENTATION RATE      Result Value Ref Range   POCT SED RATE 98 (*) 0 - 22 mm/hr    Assessment and Plan: Body aches - Plan: POCT Influenza A/B, POCT CBC, POCT SEDIMENTATION RATE, CK  Nausea and vomiting - Plan: Comprehensive metabolic panel  Other malaise and fatigue  Andrea Wilkins is here today with illness.  She has noted a lot of muscle aches and stiffness for a couple of weeks, onset of GI symptoms today only.  Suspect the GI symptoms are due to a separate vial gastroenteritis.  Check CK and sed rate to start, further follow-up pending these labs  Signed Andrea Blinks, MD  2/28: Called to go over her labs.  Her sed rate is quite high.  Will add on RF and ANA to start.  She would like something for muscle aches- will try robaxin and be in touch with her when I get more info- if any change she will let me know

## 2013-03-25 NOTE — Patient Instructions (Signed)
Try adding some ibuprofen or aleve to your current regimen.  Rest and drink plenty of liquids.   I will be in touch with the rest of your labs.  IF you are not feeling better in the next couple of days or if you are getting worse please let me know!

## 2013-03-26 ENCOUNTER — Encounter: Payer: Self-pay | Admitting: Family Medicine

## 2013-03-26 MED ORDER — METHOCARBAMOL 500 MG PO TABS: 500.0000 mg | ORAL_TABLET | Freq: Four times a day (QID) | ORAL | Status: DC | PRN

## 2013-03-26 NOTE — Addendum Note (Signed)
Addended by: Lamar Blinks C on: 03/26/2013 10:43 AM   Modules accepted: Orders

## 2013-03-28 ENCOUNTER — Telehealth: Payer: Self-pay

## 2013-03-28 DIAGNOSIS — M791 Myalgia, unspecified site: Secondary | ICD-10-CM

## 2013-03-28 LAB — RHEUMATOID FACTOR: Rhuematoid fact SerPl-aCnc: 14 IU/mL (ref <=14)

## 2013-03-28 MED ORDER — CYCLOBENZAPRINE HCL 10 MG PO TABS: ORAL_TABLET | ORAL | Status: DC

## 2013-03-28 NOTE — Telephone Encounter (Signed)
Called her back- her ANA is still pending, but her RF and CK are both normal which is good news.  Will have her try flexeril INSTEAD of the robaxin- can take at night but be aware of sedation.  Ok to use ibuprofen or tylenol as needed too.  Will be in touch as soon as I get her ANA, likely tomorrow

## 2013-03-28 NOTE — Telephone Encounter (Signed)
PATIENT STATES SHE WAS IN THE OFFICE TO SEE DR. COPLAND ON Friday. SHE WAS SEEN FOR BODY ACHES. THE MUSCLE RELAXER THAT WAS CALLED IN FOR HER ON SATURDAY IS NOT HELPING HER MUCH. SHE WOULD LIKE TO GET SOMETHING DIFFERENT CALLED INTO HER PHARMACY. SHE WOULD ALSO LIKE TO GET HER LAB RESULTS PLEASE. BEST PHONE [phone] (CELL)  PHARMACY CHOICE IS [facility] ON [address].   Neche

## 2013-03-29 ENCOUNTER — Telehealth: Payer: Self-pay | Admitting: Family Medicine

## 2013-03-29 DIAGNOSIS — R700 Elevated erythrocyte sedimentation rate: Secondary | ICD-10-CM

## 2013-03-29 LAB — ANA: Anti Nuclear Antibody(ANA): NEGATIVE

## 2013-03-29 NOTE — Telephone Encounter (Signed)
Called her back and discussed her labs- all negative except for her sed rate. She is about the same, the flexeril did seem to help.  Asked her to come in for a recheck sed rate this week and she plans to do so.  Will follow-up further then.  Also, she just did get her insurance set up.

## 2013-03-31 ENCOUNTER — Ambulatory Visit (INDEPENDENT_AMBULATORY_CARE_PROVIDER_SITE_OTHER): Payer: BC Managed Care – PPO | Admitting: Emergency Medicine

## 2013-03-31 VITALS — BP 122/76 | HR 114 | Temp 100.5°F | Resp 20 | Ht 63.0 in | Wt 210.0 lb

## 2013-03-31 DIAGNOSIS — M2550 Pain in unspecified joint: Secondary | ICD-10-CM

## 2013-03-31 LAB — POCT SEDIMENTATION RATE: POCT SED RATE: 115 mm/hr — AB (ref 0–22)

## 2013-03-31 MED ORDER — NAPROXEN SODIUM 550 MG PO TABS: 550.0000 mg | ORAL_TABLET | Freq: Two times a day (BID) | ORAL | Status: DC

## 2013-03-31 NOTE — Progress Notes (Signed)
[facility] [address] [place] [address] [phone]  Date:  03/31/2013   Name:  Andrea Wilkins   DOB:  July 31, 1965   MRN:  [id number]  PCP:  Angelica Chessman, MD    Chief Complaint: Fever and Joint Swelling   History of Present Illness:  Andrea Wilkins is a 48 y.o. very pleasant female patient who presents with the following:  Seen a week ago with polyarthralgias.  Had very high sed rate.  No improvement of her symptoms on treatment of muscle relaxants.  Describes a recent URI antedating her symptoms and has crohn's disease that is currently not symptomatic.  Is resistent to the notion that she has crohn's despite a tissue diagnosis.  No improvement with over the counter medications or other home remedies. Denies other complaint or health concern today.   Patient Active Problem List   Diagnosis Date Noted  . Crohn disease 10/23/2012  . Abdominal pain 10/23/2012  . Asthma 10/23/2012  . Tension headache 06/14/2012  . Hypertension 06/14/2012  . Otitis media 06/14/2012    Past Medical History  Diagnosis Date  . Asthma   . Crohn's disease     Past Surgical History  Procedure Laterality Date  . Breast surgery    . Cesarean section    . Small intestine surgery    . Abdominal hysterectomy      History  Substance Use Topics  . Smoking status: Never Smoker   . Smokeless tobacco: Never Used  . Alcohol Use: No    Family History  Problem Relation Age of Onset  . Hypertension Mother   . Diabetes Sister   . Hypertension Sister     No Known Allergies  Medication list has been reviewed and updated.  No current outpatient prescriptions on file prior to visit.   No current facility-administered medications on file prior to visit.    Review of Systems:  As per HPI, otherwise negative.    Physical Examination: Filed Vitals:   03/31/13 1751  BP: 122/76  Pulse: 114  Temp: 100.5 F (38.1 C)  Resp: 20   Filed Vitals:   03/31/13 1751   Height: 5' 3"  (1.6 m)  Weight: 210 lb (95.255 kg)   Body mass index is 37.21 kg/(m^2). Ideal Body Weight: Weight in (lb) to have BMI = 25: 140.8  GEN: WDWN, NAD, Non-toxic, A & O x 3 HEENT: Atraumatic, Normocephalic. Neck supple. No masses, No LAD. Ears and Nose: No external deformity. CV: RRR, No M/G/R. No JVD. No thrill. No extra heart sounds. PULM: CTA B, no wheezes, crackles, rhonchi. No retractions. No resp. distress. No accessory muscle use. ABD: S, NT, ND, +BS. No rebound. No HSM. EXTR: No c/c/e NEURO Normal gait.  PSYCH: Normally interactive. Conversant. Not depressed or anxious appearing.  Calm demeanor.  KNEE:  Left knee slight effusion and joint is warm and tender.  Right normal exam.  Assessment and Plan: Polyarthralgias; viral related or related to crohn's  Anaprox rhematology if not improved   Signed,  Ellison Carwin, MD

## 2013-04-01 LAB — URIC ACID: Uric Acid, Serum: 4.7 mg/dL (ref 2.4–7.0)

## 2013-04-11 ENCOUNTER — Ambulatory Visit: Payer: Self-pay

## 2013-04-17 ENCOUNTER — Ambulatory Visit (INDEPENDENT_AMBULATORY_CARE_PROVIDER_SITE_OTHER): Payer: BC Managed Care – PPO | Admitting: Emergency Medicine

## 2013-04-17 VITALS — BP 130/86 | HR 102 | Temp 98.5°F | Resp 18 | Ht 63.0 in | Wt 216.8 lb

## 2013-04-17 DIAGNOSIS — M130 Polyarthritis, unspecified: Secondary | ICD-10-CM

## 2013-04-17 LAB — POCT CBC
Granulocyte percent: 63 %G (ref 37–80)
HCT, POC: 30.3 % — AB (ref 37.7–47.9)
Hemoglobin: 9.4 g/dL — AB (ref 12.2–16.2)
Lymph, poc: 1.8 (ref 0.6–3.4)
MCH, POC: 26.8 pg — AB (ref 27–31.2)
MCHC: 31 g/dL — AB (ref 31.8–35.4)
MCV: 86.3 fL (ref 80–97)
MID (cbc): 0.5 (ref 0–0.9)
MPV: 6.3 fL (ref 0–99.8)
POC Granulocyte: 3.8 (ref 2–6.9)
POC LYMPH PERCENT: 29.3 %L (ref 10–50)
POC MID %: 7.7 %M (ref 0–12)
Platelet Count, POC: 476 10*3/uL — AB (ref 142–424)
RBC: 3.51 M/uL — AB (ref 4.04–5.48)
RDW, POC: 12.4 %
WBC: 6 10*3/uL (ref 4.6–10.2)

## 2013-04-17 LAB — POCT SEDIMENTATION RATE: POCT SED RATE: 140 mm/hr — AB (ref 0–22)

## 2013-04-17 MED ORDER — ACETAMINOPHEN-CODEINE #3 300-30 MG PO TABS: 1.0000 | ORAL_TABLET | ORAL | Status: DC | PRN

## 2013-04-17 MED ORDER — INDOMETHACIN 50 MG PO CAPS: 50.0000 mg | ORAL_CAPSULE | Freq: Three times a day (TID) | ORAL | Status: DC

## 2013-04-17 NOTE — Progress Notes (Signed)
[facility] [address], [place] [address] [phone]  Date:  04/17/2013   Name:  Andrea Wilkins   DOB:  04-28-1965   MRN:  [id number]  PCP:  Angelica Chessman, MD    Chief Complaint: Generalized Body Aches   History of Present Illness:  Andrea Wilkins is a 48 y.o. very pleasant female patient who presents with the following:  Patient seen previously with joint swelling and pain.  Lab workup so far but for ESR is normal.  She has an appt with rheumatology in a week.  Is struggling due to pain and swelling, particularly in the right foot, both knees and hands and fingers.  She has no fever or chills. No rash. No tick exposure.  She had an antecedent viral infection.  She is not receiving much benefit from prescribed NSAID, taking more often than prescribed.  Denies other complaint or health concern today.   Patient Active Problem List   Diagnosis Date Noted  . Crohn disease 10/23/2012  . Abdominal pain 10/23/2012  . Asthma 10/23/2012  . Tension headache 06/14/2012  . Hypertension 06/14/2012  . Otitis media 06/14/2012    Past Medical History  Diagnosis Date  . Asthma   . Crohn's disease     Past Surgical History  Procedure Laterality Date  . Breast surgery    . Cesarean section    . Small intestine surgery    . Abdominal hysterectomy      History  Substance Use Topics  . Smoking status: Never Smoker   . Smokeless tobacco: Never Used  . Alcohol Use: No    Family History  Problem Relation Age of Onset  . Hypertension Mother   . Diabetes Sister   . Hypertension Sister     No Known Allergies  Medication list has been reviewed and updated.  Current Outpatient Prescriptions on File Prior to Visit  Medication Sig Dispense Refill  . naproxen sodium (ANAPROX DS) 550 MG tablet Take 1 tablet (550 mg total) by mouth 2 (two) times daily with a meal.  40 tablet  0   No current facility-administered medications on file prior to visit.     Review of Systems:  As per HPI, otherwise negative.    Physical Examination: Filed Vitals:   04/17/13 1506  BP: 130/86  Pulse: 102  Temp: 98.5 F (36.9 C)  Resp: 18   Filed Vitals:   04/17/13 1506  Height: _0  (1.6 m)  Weight: 216 lb 12.8 oz (98.34 kg)   Body mass index is 38.41 kg/(m^2). Ideal Body Weight: Weight in (lb) to have BMI = 25: 140.8  GEN: WDWN, NAD, Non-toxic, A & O x 3 HEENT: Atraumatic, Normocephalic. Neck supple. No masses, No LAD. Ears and Nose: No external deformity. CV: RRR, No M/G/R. No JVD. No thrill. No extra heart sounds. PULM: CTA B, no wheezes, crackles, rhonchi. No retractions. No resp. distress. No accessory muscle use. ABD: S, NT, ND, +BS. No rebound. No HSM. EXTR: No c/c/e.  Has swollen right ankle and forefoot with marked tenderness.  Also swelling and tenderness knees and MCP's  NEURO Normal gait.  PSYCH: Normally interactive. Conversant. Not depressed or anxious appearing.  Calm demeanor.    Assessment and Plan: polyarthralgias likely related to Crohn's or viral Will try and move appt up sooner Start T#3 Resisting use of prednisone with rheumatology appt so soon   Signed,  Ellison Carwin, MD

## 2013-04-17 NOTE — Addendum Note (Signed)
Addended by: Yvette Rack on: 04/17/2013 04:11 PM   Modules accepted: Orders

## 2013-04-17 NOTE — Patient Instructions (Signed)
Arthralgia Your caregiver has diagnosed you as suffering from an arthralgia. Arthralgia means there is pain in a joint. This can come from many reasons including:  Bruising the joint which causes soreness (inflammation) in the joint.  Wear and tear on the joints which occur as we grow older (osteoarthritis).  Overusing the joint.  Various forms of arthritis.  Infections of the joint. Regardless of the cause of pain in your joint, most of these different pains respond to anti-inflammatory drugs and rest. The exception to this is when a joint is infected, and these cases are treated with antibiotics, if it is a bacterial infection. HOME CARE INSTRUCTIONS   Rest the injured area for as long as directed by your caregiver. Then slowly start using the joint as directed by your caregiver and as the pain allows. Crutches as directed may be useful if the ankles, knees or hips are involved. If the knee was splinted or casted, continue use and care as directed. If an stretchy or elastic wrapping bandage has been applied today, it should be removed and re-applied every 3 to 4 hours. It should not be applied tightly, but firmly enough to keep swelling down. Watch toes and feet for swelling, bluish discoloration, coldness, numbness or excessive pain. If any of these problems (symptoms) occur, remove the ace bandage and re-apply more loosely. If these symptoms persist, contact your caregiver or return to this location.  For the first 24 hours, keep the injured extremity elevated on pillows while lying down.  Apply ice for 15-20 minutes to the sore joint every couple hours while awake for the first half day. Then 03-04 times per day for the first 48 hours. Put the ice in a plastic bag and place a towel between the bag of ice and your skin.  Wear any splinting, casting, elastic bandage applications, or slings as instructed.  Only take over-the-counter or prescription medicines for pain, discomfort, or fever as  directed by your caregiver. Do not use aspirin immediately after the injury unless instructed by your physician. Aspirin can cause increased bleeding and bruising of the tissues.  If you were given crutches, continue to use them as instructed and do not resume weight bearing on the sore joint until instructed. Persistent pain and inability to use the sore joint as directed for more than 2 to 3 days are warning signs indicating that you should see a caregiver for a follow-up visit as soon as possible. Initially, a hairline fracture (break in bone) may not be evident on X-rays. Persistent pain and swelling indicate that further evaluation, non-weight bearing or use of the joint (use of crutches or slings as instructed), or further X-rays are indicated. X-rays may sometimes not show a small fracture until a week or 10 days later. Make a follow-up appointment with your own caregiver or one to whom we have referred you. A radiologist (specialist in reading X-rays) may read your X-rays. Make sure you know how you are to obtain your X-ray results. Do not assume everything is normal if you do not hear from [place]. SEEK MEDICAL CARE IF: Bruising, swelling, or pain increases. SEEK IMMEDIATE MEDICAL CARE IF:   Your fingers or toes are numb or blue.  The pain is not responding to medications and continues to stay the same or get worse.  The pain in your joint becomes severe.  You develop a fever over 102 F (38.9 C).  It becomes impossible to move or use the joint. MAKE SURE YOU:  Understand these instructions.  Will watch your condition.  Will get help right away if you are not doing well or get worse. Document Released: 01/13/2005 Document Revised: 04/07/2011 Document Reviewed: 09/01/2007 [facility] Patient Information 2014 [place].

## 2013-04-18 LAB — ACUTE HEP PANEL AND HEP B SURFACE AB
HCV Ab: NEGATIVE
Hep A IgM: NONREACTIVE
Hep B C IgM: NONREACTIVE
Hep B S Ab: POSITIVE — AB
Hepatitis B Surface Ag: NEGATIVE

## 2013-04-18 LAB — CBC WITH DIFFERENTIAL/PLATELET
Basophils Absolute: 0 10*3/uL (ref 0.0–0.1)
Basophils Relative: 0 % (ref 0–1)
Eosinophils Absolute: 0.1 10*3/uL (ref 0.0–0.7)
Eosinophils Relative: 2 % (ref 0–5)
HCT: 29 % — ABNORMAL LOW (ref 36.0–46.0)
Hemoglobin: 9.7 g/dL — ABNORMAL LOW (ref 12.0–15.0)
Lymphocytes Relative: 28 % (ref 12–46)
Lymphs Abs: 1.7 10*3/uL (ref 0.7–4.0)
MCH: 28 pg (ref 26.0–34.0)
MCHC: 33.4 g/dL (ref 30.0–36.0)
MCV: 83.6 fL (ref 78.0–100.0)
Monocytes Absolute: 0.5 10*3/uL (ref 0.1–1.0)
Monocytes Relative: 8 % (ref 3–12)
Neutro Abs: 3.7 10*3/uL (ref 1.7–7.7)
Neutrophils Relative %: 62 % (ref 43–77)
Platelets: 418 10*3/uL — ABNORMAL HIGH (ref 150–400)
RBC: 3.47 MIL/uL — ABNORMAL LOW (ref 3.87–5.11)
RDW: 13 % (ref 11.5–15.5)
WBC: 5.9 10*3/uL (ref 4.0–10.5)

## 2013-04-18 LAB — C-REACTIVE PROTEIN: CRP: 17.3 mg/dL — ABNORMAL HIGH (ref <0.60)

## 2013-04-18 LAB — URIC ACID: Uric Acid, Serum: 4.7 mg/dL (ref 2.4–7.0)

## 2013-04-19 LAB — LYME AB/WESTERN BLOT REFLEX: B burgdorferi Ab IgG+IgM: 0.53 {ISR}

## 2013-08-18 ENCOUNTER — Emergency Department (HOSPITAL_COMMUNITY)
Admission: EM | Admit: 2013-08-18 | Discharge: 2013-08-18 | Disposition: A | Discharge status: Home / Self Care | Payer: BC Managed Care – PPO | Source: Home / Self Care | Attending: Emergency Medicine | Admitting: Emergency Medicine

## 2013-08-18 ENCOUNTER — Encounter (HOSPITAL_COMMUNITY): Payer: Self-pay | Admitting: Emergency Medicine

## 2013-08-18 DIAGNOSIS — M25569 Pain in unspecified knee: Secondary | ICD-10-CM

## 2013-08-18 DIAGNOSIS — M25562 Pain in left knee: Secondary | ICD-10-CM

## 2013-08-18 DIAGNOSIS — M25561 Pain in right knee: Secondary | ICD-10-CM

## 2013-08-18 MED ORDER — MELOXICAM 7.5 MG PO TABS: 7.5000 mg | ORAL_TABLET | Freq: Every day | ORAL | Status: DC

## 2013-08-18 NOTE — ED Provider Notes (Signed)
Medical screening examination/treatment/procedure(s) were performed by a resident physician or non-physician practitioner and as the supervising physician I was immediately available for consultation/collaboration.  Linna Darner, MD Family Medicine   Waldemar Dickens, MD 08/18/13 [phone]

## 2013-08-18 NOTE — ED Notes (Signed)
C/o left leg pain Denies any injury  Does have hx of RA States she can barely walk

## 2013-08-18 NOTE — Discharge Instructions (Signed)
Ice and elevation to reduce pain and swelling. Take Mobic on a daily basis to reduce pain and inflammation. I do not suspect you have a blood clot in either leg. If symptoms do not begin to improve, please either follow up with your doctor at [facility] or [place] [facility] for further evaluation.  Arthralgia Your caregiver has diagnosed you as suffering from an arthralgia. Arthralgia means there is pain in a joint. This can come from many reasons including:  Bruising the joint which causes soreness (inflammation) in the joint.  Wear and tear on the joints which occur as we grow older (osteoarthritis).  Overusing the joint.  Various forms of arthritis.  Infections of the joint. Regardless of the cause of pain in your joint, most of these different pains respond to anti-inflammatory drugs and rest. The exception to this is when a joint is infected, and these cases are treated with antibiotics, if it is a bacterial infection. HOME CARE INSTRUCTIONS   Rest the injured area for as long as directed by your caregiver. Then slowly start using the joint as directed by your caregiver and as the pain allows. Crutches as directed may be useful if the ankles, knees or hips are involved. If the knee was splinted or casted, continue use and care as directed. If an stretchy or elastic wrapping bandage has been applied today, it should be removed and re-applied every 3 to 4 hours. It should not be applied tightly, but firmly enough to keep swelling down. Watch toes and feet for swelling, bluish discoloration, coldness, numbness or excessive pain. If any of these problems (symptoms) occur, remove the ace bandage and re-apply more loosely. If these symptoms persist, contact your caregiver or return to this location.  For the first 24 hours, keep the injured extremity elevated on pillows while lying down.  Apply ice for 15-20 minutes to the sore joint every couple hours  while awake for the first half day. Then 03-04 times per day for the first 48 hours. Put the ice in a plastic bag and place a towel between the bag of ice and your skin.  Wear any splinting, casting, elastic bandage applications, or slings as instructed.  Only take over-the-counter or prescription medicines for pain, discomfort, or fever as directed by your caregiver. Do not use aspirin immediately after the injury unless instructed by your physician. Aspirin can cause increased bleeding and bruising of the tissues.  If you were given crutches, continue to use them as instructed and do not resume weight bearing on the sore joint until instructed. Persistent pain and inability to use the sore joint as directed for more than 2 to 3 days are warning signs indicating that you should see a caregiver for a follow-up visit as soon as possible. Initially, a hairline fracture (break in bone) may not be evident on X-rays. Persistent pain and swelling indicate that further evaluation, non-weight bearing or use of the joint (use of crutches or slings as instructed), or further X-rays are indicated. X-rays may sometimes not show a small fracture until a week or 10 days later. Make a follow-up appointment with your own caregiver or one to whom we have referred you. A radiologist (specialist in reading X-rays) may read your X-rays. Make sure you know how you are to obtain your X-ray results. Do not assume everything is normal if you do not hear from [place]. SEEK MEDICAL CARE IF: Bruising, swelling, or pain increases. [place]  CARE IF:   Your fingers or toes are numb or blue.  The pain is not responding to medications and continues to stay the same or get worse.  The pain in your joint becomes severe.  You develop a fever over 102 F (38.9 C).  It becomes impossible to move or use the joint. MAKE SURE YOU:   Understand these instructions.  Will watch your condition.  Will get help right away if  you are not doing well or get worse. Document Released: 01/13/2005 Document Revised: 04/07/2011 Document Reviewed: 09/01/2007 [facility] Patient Information 2015 [place], [place]. This information is not intended to replace advice given to you by your health care provider. Make sure you discuss any questions you have with your health care provider.  Knee Pain The knee is the complex joint between your thigh and your lower leg. It is made up of bones, tendons, ligaments, and cartilage. The bones that make up the knee are:  The femur in the thigh.  The tibia and fibula in the lower leg.  The patella or kneecap riding in the groove on the lower femur. CAUSES  Knee pain is a common complaint with many causes. A few of these causes are:  Injury, such as:  A ruptured ligament or tendon injury.  Torn cartilage.  Medical conditions, such as:  Gout  Arthritis  Infections  Overuse, over training, or overdoing a physical activity. Knee pain can be minor or severe. Knee pain can accompany debilitating injury. Minor knee problems often respond well to self-care measures or get well on their own. More serious injuries may need medical intervention or even surgery. SYMPTOMS The knee is complex. Symptoms of knee problems can vary widely. Some of the problems are:  Pain with movement and weight bearing.  Swelling and tenderness.  Buckling of the knee.  Inability to straighten or extend your knee.  Your knee locks and you cannot straighten it.  Warmth and redness with pain and fever.  Deformity or dislocation of the kneecap. DIAGNOSIS  Determining what is wrong may be very straight forward such as when there is an injury. It can also be challenging because of the complexity of the knee. Tests to make a diagnosis may include:  Your caregiver taking a history and doing a physical exam.  Routine X-rays can be used to rule out other problems. X-rays will not reveal a cartilage tear. Some  injuries of the knee can be diagnosed by:  Arthroscopy a surgical technique by which a small video camera is inserted through tiny incisions on the sides of the knee. This procedure is used to examine and repair internal knee joint problems. Tiny instruments can be used during arthroscopy to repair the torn knee cartilage (meniscus).  Arthrography is a radiology technique. A contrast liquid is directly injected into the knee joint. Internal structures of the knee joint then become visible on X-ray film.  An MRI scan is a non X-ray radiology procedure in which magnetic fields and a computer produce two- or three-dimensional images of the inside of the knee. Cartilage tears are often visible using an MRI scanner. MRI scans have largely replaced arthrography in diagnosing cartilage tears of the knee.  Blood work.  Examination of the fluid that helps to lubricate the knee joint (synovial fluid). This is done by taking a sample out using a needle and a syringe. TREATMENT The treatment of knee problems depends on the cause. Some of these treatments are:  Depending on the injury, proper  casting, splinting, surgery, or physical therapy care will be needed.  Give yourself adequate recovery time. Do not overuse your joints. If you begin to get sore during workout routines, back off. Slow down or do fewer repetitions.  For repetitive activities such as cycling or running, maintain your strength and nutrition.  Alternate muscle groups. For example, if you are a weight lifter, work the upper body on one day and the lower body the next.  Either tight or weak muscles do not give the proper support for your knee. Tight or weak muscles do not absorb the stress placed on the knee joint. Keep the muscles surrounding the knee strong.  Take care of mechanical problems.  If you have flat feet, orthotics or special shoes may help. See your caregiver if you need help.  Arch supports, sometimes with wedges on the  inner or outer aspect of the heel, can help. These can shift pressure away from the side of the knee most bothered by osteoarthritis.  A brace called an "unloader" brace also may be used to help ease the pressure on the most arthritic side of the knee.  If your caregiver has prescribed crutches, braces, wraps or ice, use as directed. The acronym for this is PRICE. This means protection, rest, ice, compression, and elevation.  Nonsteroidal anti-inflammatory drugs (NSAIDs), can help relieve pain. But if taken immediately after an injury, they may actually increase swelling. Take NSAIDs with food in your stomach. Stop them if you develop stomach problems. Do not take these if you have a history of ulcers, stomach pain, or bleeding from the bowel. Do not take without your caregiver's approval if you have problems with fluid retention, heart failure, or kidney problems.  For ongoing knee problems, physical therapy may be helpful.  Glucosamine and chondroitin are over-the-counter dietary supplements. Both may help relieve the pain of osteoarthritis in the knee. These medicines are different from the usual anti-inflammatory drugs. Glucosamine may decrease the rate of cartilage destruction.  Injections of a corticosteroid drug into your knee joint may help reduce the symptoms of an arthritis flare-up. They may provide pain relief that lasts a few months. You may have to wait a few months between injections. The injections do have a small increased risk of infection, water retention, and elevated blood sugar levels.  Hyaluronic acid injected into damaged joints may ease pain and provide lubrication. These injections may work by reducing inflammation. A series of shots may give relief for as long as 6 months.  Topical painkillers. Applying certain ointments to your skin may help relieve the pain and stiffness of osteoarthritis. Ask your pharmacist for suggestions. Many over the-counter products are approved  for temporary relief of arthritis pain.  In some countries, doctors often prescribe topical NSAIDs for relief of chronic conditions such as arthritis and tendinitis. A review of treatment with NSAID creams found that they worked as well as oral medications but without the serious side effects. PREVENTION  Maintain a healthy weight. Extra pounds put more strain on your joints.  Get strong, stay limber. Weak muscles are a common cause of knee injuries. Stretching is important. Include flexibility exercises in your workouts.  Be smart about exercise. If you have osteoarthritis, chronic knee pain or recurring injuries, you may need to change the way you exercise. This does not mean you have to stop being active. If your knees ache after jogging or playing basketball, consider switching to swimming, water aerobics, or other low-impact activities, at least for  a few days a week. Sometimes limiting high-impact activities will provide relief.  Make sure your shoes fit well. Choose footwear that is right for your sport.  Protect your knees. Use the proper gear for knee-sensitive activities. Use kneepads when playing volleyball or laying carpet. Buckle your seat belt every time you drive. Most shattered kneecaps occur in car accidents.  Rest when you are tired. SEEK MEDICAL CARE IF:  You have knee pain that is continual and does not seem to be getting better.  SEEK IMMEDIATE MEDICAL CARE IF:  Your knee joint feels hot to the touch and you have a high fever. MAKE SURE YOU:   Understand these instructions.  Will watch your condition.  Will get help right away if you are not doing well or get worse. Document Released: 11/10/2006 Document Revised: 04/07/2011 Document Reviewed: 11/10/2006 [facility] Patient Information 2015 [place], [place]. This information is not intended to replace advice given to you by your health care provider. Make sure you discuss any questions you have with your health care  provider.

## 2013-08-18 NOTE — ED Provider Notes (Signed)
CSN: [id number]     Arrival date & time 08/18/13  1152 History   First MD Initiated Contact with Patient 08/18/13 1242     Chief Complaint  Patient presents with  . Leg Pain   (Consider location/radiation/quality/duration/timing/severity/associated sxs/prior Treatment) HPI Comments: Patient reports that she was diagnosed with RA in April 2015 by Dr. Amil Amen at [facility] and has been receiving Humira injections for treatment. States that prior to diagnosis, she was experiencing widespread joint pain with pain being most noticeable at bilateral lower extremities. States her symptoms have improved with Humira therapy, but still has some difficulty with bilateral lower extremity pain. (L>R).  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 States her legs are usually feeling fine at the beginning of her work days, but states by the time she finishes her second job, her legs are quite sore and swollen. Majority of discomfort and swelling is focused at her knees.  Her symptoms will often improve with rest and elevation of her extremities.  Denies chest pain, calf pain, dyspnea or pain with deep inspiration.   The history is provided by the patient.    Past Medical History  Diagnosis Date  . Asthma   . Crohn's disease    Past Surgical History  Procedure Laterality Date  . Breast surgery    . Cesarean section    . Small intestine surgery    . Abdominal hysterectomy     Family History  Problem Relation Age of Onset  . Hypertension Mother   . Diabetes Sister   . Hypertension Sister    History  Substance Use Topics  . Smoking status: Never Smoker   . Smokeless tobacco: Never Used  . Alcohol Use: No   OB History   Grav Para Term Preterm Abortions TAB SAB Ect Mult Living                 Review of Systems  All other systems reviewed and are negative.   Allergies  Review of  patient's allergies indicates no known allergies.  Home Medications   Prior to Admission medications   Medication Sig Start Date End Date Taking? Authorizing Provider  acetaminophen-codeine (TYLENOL #3) 300-30 MG per tablet Take 1-2 tablets by mouth every 4 (four) hours as needed. 04/17/13   Ellison Carwin, MD  indomethacin (INDOCIN) 50 MG capsule Take 1 capsule (50 mg total) by mouth 3 (three) times daily with meals. 04/17/13   Ellison Carwin, MD  meloxicam (MOBIC) 7.5 MG tablet Take 1 tablet (7.5 mg total) by mouth daily. 08/18/13   Lahoma Rocker, PA  naproxen sodium (ANAPROX DS) 550 MG tablet Take 1 tablet (550 mg total) by mouth 2 (two) times daily with a meal. 03/31/13 03/31/14  Ellison Carwin, MD   BP 162/95  Pulse 68  Temp(Src) 98.1 F (36.7 C) (Oral)  Resp 16  SpO2 100% Physical Exam  Nursing note and vitals reviewed. Constitutional: She is oriented to person, place, and time. She appears well-developed and well-nourished.  +obese  HENT:  Head: Normocephalic and atraumatic.  Eyes: Conjunctivae are normal. No scleral icterus.  Cardiovascular: Normal rate.   Pulmonary/Chest: Effort normal.  Musculoskeletal: Normal range of motion. She exhibits tenderness. She exhibits no edema.       Right knee: She exhibits normal range of motion, no swelling, no effusion, no ecchymosis, no deformity, no laceration and no erythema.  Left knee: She exhibits normal range of motion, no swelling, no effusion, no ecchymosis, no deformity, no laceration and no erythema.  Reports generalized tenderness at entire circumference of both her knees CSM exam of both her LEs normal.   Neurological: She is alert and oriented to person, place, and time.  Skin: Skin is warm and dry. No rash noted. No erythema.  Psychiatric: She has a normal mood and affect. Her behavior is normal.    ED Course  Procedures (including critical care time) Labs Review Labs Reviewed - No data to display  Imaging  Review No results found.   MDM   1. Knee pain, left   2. Knee pain, right   No clinical indication of DVT in either LE. I suspect her discomfort is an unfortunate combination of pre-existing RA, lengthy daily work schedules requiring her to spend a great deal of time on her feet compounded by obesity. Advised weight loss, use of Mobic as prescribed, continued ice and elevation as needed and follow up with PCP or rheumatologist if symptoms persist.    Lahoma Rocker, PA 08/18/13 1442

## 2013-10-17 ENCOUNTER — Ambulatory Visit: Payer: BC Managed Care – PPO | Admitting: Family Medicine

## 2013-10-27 ENCOUNTER — Ambulatory Visit: Payer: BC Managed Care – PPO | Admitting: Family Medicine

## 2013-10-27 ENCOUNTER — Encounter: Payer: Self-pay | Admitting: Family Medicine

## 2013-10-27 ENCOUNTER — Ambulatory Visit: Payer: Managed Care, Other (non HMO) | Attending: Family Medicine | Admitting: Family Medicine

## 2013-10-27 VITALS — BP 121/83 | HR 75 | Temp 98.8°F | Resp 18 | Ht 63.0 in | Wt 202.0 lb

## 2013-10-27 DIAGNOSIS — D509 Iron deficiency anemia, unspecified: Secondary | ICD-10-CM | POA: Insufficient documentation

## 2013-10-27 DIAGNOSIS — I10 Essential (primary) hypertension: Secondary | ICD-10-CM | POA: Insufficient documentation

## 2013-10-27 DIAGNOSIS — M069 Rheumatoid arthritis, unspecified: Secondary | ICD-10-CM | POA: Insufficient documentation

## 2013-10-27 DIAGNOSIS — D649 Anemia, unspecified: Secondary | ICD-10-CM

## 2013-10-27 DIAGNOSIS — R609 Edema, unspecified: Secondary | ICD-10-CM | POA: Insufficient documentation

## 2013-10-27 LAB — IRON AND TIBC
%SAT: 13 % — ABNORMAL LOW (ref 20–55)
Iron: 34 ug/dL — ABNORMAL LOW (ref 42–145)
TIBC: 264 ug/dL (ref 250–470)
UIBC: 230 ug/dL (ref 125–400)

## 2013-10-27 MED ORDER — VALSARTAN-HYDROCHLOROTHIAZIDE 80-12.5 MG PO TABS: 2.0000 | ORAL_TABLET | Freq: Every day | ORAL | Status: DC

## 2013-10-27 MED ORDER — MEDICAL COMPRESSION STOCKINGS MISC: Status: DC

## 2013-10-27 MED ORDER — FERROUS SULFATE 325 (65 FE) MG PO TABS: 325.0000 mg | ORAL_TABLET | Freq: Two times a day (BID) | ORAL | Status: DC

## 2013-10-27 NOTE — Patient Instructions (Signed)
[name],   Thank you for coming in today. It was a pleasure meeting you. I look forward to being your primary doctor.  1. For HTN: at goal, medication refilled. Increased HCTZ dose.   2. For iron  deficiency anemia with leg swelling . -Take oral iron. The tablets are special : A, easy to swallow.  -Use compression stockings. -For best absorption take iron with 4-8 oz of orange juice.   F/u in 3 months for repeat CBC  Dr. Adrian Blackwater

## 2013-10-27 NOTE — Assessment & Plan Note (Signed)
A: BP at goal Meds: complaint P: Continue medication, increase HCTZ dose.

## 2013-10-27 NOTE — Assessment & Plan Note (Signed)
A: persistent likely cause of peripheral edema given normal BP P: CBC, iron studies  Oral iron

## 2013-10-27 NOTE — Progress Notes (Signed)
Medicine refill. Rx [email or web address] Complaining of leg swelling and knot on rt knee and pain

## 2013-10-27 NOTE — Progress Notes (Signed)
   Subjective:    Patient ID: Andrea Wilkins, female    DOB: 1965-11-13, 48 y.o.   MRN: [id number] CC: f/u HTN, R knee pain  Hypertension   48 year old female presents for follow visit discussed the following:  #1 hypertension: compliant with meds. No CP, SOB. Has LE edema with leg pain.  #2 IDA: not on oral iron. Has LE edema.   Social history: Chronic nonsmoker Review of Systems As per history of present illness    Objective:   Physical Exam BP 121/83  Pulse 75  Temp(Src) 98.8 F (37.1 C) (Oral)  Resp 18  Ht 5' 3"  (1.6 m)  Wt 202 lb (91.627 kg)  BMI 35.79 kg/m2  SpO2 100% Wt Readings from Last 3 Encounters:  10/27/13 202 lb (91.627 kg)  04/17/13 216 lb 12.8 oz (98.34 kg)  03/31/13 210 lb (95.255 kg)  General appearance: alert, cooperative and no distress Lungs: clear to auscultation bilaterally Heart: regular rate and rhythm, S1, S2 normal, no murmur, click, rub or gallop Extremities: edema trace        Assessment & Plan:

## 2013-10-28 ENCOUNTER — Ambulatory Visit
Admission: RE | Admit: 2013-10-28 | Discharge: 2013-10-28 | Disposition: A | Discharge status: Home / Self Care | Payer: Managed Care, Other (non HMO) | Source: Ambulatory Visit | Attending: Family Medicine | Admitting: Family Medicine

## 2013-10-28 ENCOUNTER — Other Ambulatory Visit: Payer: Self-pay | Admitting: Family Medicine

## 2013-10-28 DIAGNOSIS — M25561 Pain in right knee: Secondary | ICD-10-CM

## 2013-10-28 DIAGNOSIS — M25562 Pain in left knee: Principal | ICD-10-CM

## 2013-10-28 DIAGNOSIS — R262 Difficulty in walking, not elsewhere classified: Secondary | ICD-10-CM

## 2013-11-02 ENCOUNTER — Telehealth: Payer: Self-pay | Admitting: *Deleted

## 2013-11-02 NOTE — Telephone Encounter (Signed)
Left message to return call 

## 2013-11-02 NOTE — Telephone Encounter (Signed)
Message copied by Betti Cruz on Wed Nov 02, 2013  4:32 PM ------      Message from: Boykin Nearing      Created: Fri Oct 28, 2013  9:33 AM       Persistent low iron and %sat consistent with iron deficiency anemia. Please take oral iron. ------

## 2013-11-03 ENCOUNTER — Telehealth: Payer: Self-pay | Admitting: Family Medicine

## 2013-11-03 NOTE — Telephone Encounter (Signed)
Pt. Called back , please f/u with pt tomorrow before 1 p.m.

## 2013-11-07 ENCOUNTER — Telehealth: Payer: Self-pay | Admitting: Emergency Medicine

## 2013-11-07 NOTE — Telephone Encounter (Signed)
Pt called back to clarify medical supply sore name

## 2013-12-26 ENCOUNTER — Emergency Department (HOSPITAL_COMMUNITY): Payer: Managed Care, Other (non HMO)

## 2013-12-26 ENCOUNTER — Emergency Department (HOSPITAL_COMMUNITY)
Admission: EM | Admit: 2013-12-26 | Discharge: 2013-12-26 | Disposition: A | Discharge status: Home / Self Care | Payer: Managed Care, Other (non HMO) | Attending: Emergency Medicine | Admitting: Emergency Medicine

## 2013-12-26 ENCOUNTER — Encounter (HOSPITAL_COMMUNITY): Payer: Self-pay | Admitting: *Deleted

## 2013-12-26 DIAGNOSIS — Z8739 Personal history of other diseases of the musculoskeletal system and connective tissue: Secondary | ICD-10-CM | POA: Insufficient documentation

## 2013-12-26 DIAGNOSIS — M79675 Pain in left toe(s): Secondary | ICD-10-CM

## 2013-12-26 DIAGNOSIS — Y9289 Other specified places as the place of occurrence of the external cause: Secondary | ICD-10-CM | POA: Diagnosis not present

## 2013-12-26 DIAGNOSIS — Y998 Other external cause status: Secondary | ICD-10-CM | POA: Diagnosis not present

## 2013-12-26 DIAGNOSIS — I10 Essential (primary) hypertension: Secondary | ICD-10-CM | POA: Insufficient documentation

## 2013-12-26 DIAGNOSIS — Z8719 Personal history of other diseases of the digestive system: Secondary | ICD-10-CM | POA: Diagnosis not present

## 2013-12-26 DIAGNOSIS — W1839XA Other fall on same level, initial encounter: Secondary | ICD-10-CM | POA: Insufficient documentation

## 2013-12-26 DIAGNOSIS — S99922A Unspecified injury of left foot, initial encounter: Secondary | ICD-10-CM | POA: Insufficient documentation

## 2013-12-26 DIAGNOSIS — Y9389 Activity, other specified: Secondary | ICD-10-CM | POA: Diagnosis not present

## 2013-12-26 DIAGNOSIS — Z79899 Other long term (current) drug therapy: Secondary | ICD-10-CM | POA: Insufficient documentation

## 2013-12-26 MED ORDER — TRAMADOL HCL 50 MG PO TABS
50.0000 mg | ORAL_TABLET | Freq: Once | ORAL | Status: AC
  Administered 2013-12-26: 50 mg via ORAL
  Filled 2013-12-26: qty 1

## 2013-12-26 MED ORDER — TRAMADOL HCL 50 MG PO TABS: 50.0000 mg | ORAL_TABLET | Freq: Four times a day (QID) | ORAL | Status: DC | PRN

## 2013-12-26 NOTE — ED Notes (Signed)
Pt states thanksgiving day she had her closet door fall on her L foot/5th toe, states having 5th toe pain, states been taking ibuprofen at home w/ no relief, states has also taped her toes together.

## 2013-12-26 NOTE — Discharge Instructions (Signed)
There is no fracture noted on x-ray, Your toe has been buddy taped and a postop shoe prescribed for comfort only.  He been given a prescription for Ultram to help her discomfort

## 2013-12-26 NOTE — ED Provider Notes (Signed)
CSN: [id number]     Arrival date & time 12/26/13  2143 History   None    Chief Complaint  Patient presents with  . Toe Injury   The history is provided by the patient. No language interpreter was used.   This chart was scribed for nurse practitioner Junius Creamer, NP working with Dorie Rank, MD, by Thea Alken, ED Scribe. This patient was seen in room WTR6/WTR6 and the patient's care was started at 10:27 PM.  Andrea Wilkins is a 48 y.o. female who presents to the Emergency Department complaining of left 5th toe injury that occurred 4 days ago. Pt states she dropped a closet door on left 5th toe. She reports worsening pain since injury. Pt has taken ibuprofen for the pain.  Past Medical History  Diagnosis Date  . Crohn's disease   . Hypertension Dx 2013  . Arthritis 2015   Past Surgical History  Procedure Laterality Date  . Abdominal hysterectomy  2008  . Small intestine surgery  2008  . Cesarean section  1988  . Breast surgery  2007   Family History  Problem Relation Age of Onset  . Hypertension Mother   . Diabetes Sister   . Hypertension Sister   . Diabetes Father    History  Substance Use Topics  . Smoking status: Never Smoker   . Smokeless tobacco: Never Used  . Alcohol Use: No   OB History    No data available     Review of Systems  Musculoskeletal: Positive for myalgias and arthralgias.   Allergies  Review of patient's allergies indicates no known allergies.  Home Medications   Prior to Admission medications   Medication Sig Start Date End Date Taking? Authorizing Provider  Adalimumab (HUMIRA) 40 MG/0.8ML PSKT Inject 40 mg into the skin every 14 (fourteen) days. On Ondays    Historical Provider, MD  Elastic Bandages & Supports (MEDICAL COMPRESSION STOCKINGS) Globe Wear daily. 24-30 mmHg 10/27/13   Josalyn C Funches, MD  ferrous sulfate 325 (65 FE) MG tablet Take 1 tablet (325 mg total) by mouth 2 (two) times daily with a meal. 10/27/13   Josalyn C Funches, MD   valsartan-hydrochlorothiazide (DIOVAN HCT) 80-12.5 MG per tablet Take 2 tablets by mouth daily. 10/27/13   Josalyn C Funches, MD   BP 159/93 mmHg  Pulse 82  Temp(Src) 98.6 F (37 C) (Oral)  Resp 18  Ht 5' 3"  (1.6 m)  Wt 197 lb (89.359 kg)  BMI 34.91 kg/m2  SpO2 95% Physical Exam  Constitutional: She is oriented to person, place, and time. She appears well-developed and well-nourished. No distress.  HENT:  Head: Normocephalic and atraumatic.  Eyes: Conjunctivae and EOM are normal.  Neck: Neck supple.  Cardiovascular: Normal rate.   Pulmonary/Chest: Effort normal.  Musculoskeletal: Normal range of motion.  Left 5th toe is bruised and rotated outward. Nail is intact.  Neurological: She is alert and oriented to person, place, and time.  Skin: Skin is warm and dry.  Psychiatric: She has a normal mood and affect. Her behavior is normal.  Nursing note and vitals reviewed.   ED Course  Procedures (including critical care time) DIAGNOSTIC STUDIES: Oxygen Saturation is 95% on RA, normal by my interpretation.    COORDINATION OF CARE: 10:31 PM- Pt advised of plan for treatment and pt agrees.  Labs Review Labs Reviewed - No data to display  Imaging Review No results found.   EKG Interpretation None     X-ray is normal.  Patient has been buddy taped and placed in a postop shoe for comfort given a prescription for tramadol MDM   Final diagnoses:  None      I personally performed the services described in this documentation, which was scribed in my presence. The recorded information has been reviewed and is accurate.     Garald Balding, NP 12/26/13 [place], MD 12/27/13 [phone]

## 2014-02-24 ENCOUNTER — Encounter: Payer: Self-pay | Admitting: Neurology

## 2014-02-24 ENCOUNTER — Ambulatory Visit (INDEPENDENT_AMBULATORY_CARE_PROVIDER_SITE_OTHER): Payer: 59 | Admitting: Neurology

## 2014-02-24 ENCOUNTER — Ambulatory Visit (INDEPENDENT_AMBULATORY_CARE_PROVIDER_SITE_OTHER): Payer: Self-pay | Admitting: Neurology

## 2014-02-24 DIAGNOSIS — G5791 Unspecified mononeuropathy of right lower limb: Secondary | ICD-10-CM

## 2014-02-24 DIAGNOSIS — G44209 Tension-type headache, unspecified, not intractable: Secondary | ICD-10-CM

## 2014-02-24 DIAGNOSIS — G5793 Unspecified mononeuropathy of bilateral lower limbs: Secondary | ICD-10-CM

## 2014-02-24 DIAGNOSIS — G5792 Unspecified mononeuropathy of left lower limb: Secondary | ICD-10-CM

## 2014-02-24 DIAGNOSIS — M25562 Pain in left knee: Secondary | ICD-10-CM

## 2014-02-24 NOTE — Procedures (Signed)
     HISTORY:  Andrea Wilkins is a 49 year old patient with a one-year history of pain primarily in the left leg below the knee. The patient indicates that the pain will come on with weightbearing, and goes away when she is off her feet. She denies any back pain or pain radiating down the leg. She does have some numbness in the great toe. She is being evaluated for a possible neuropathy or a lumbosacral radiculopathy.  NERVE CONDUCTION STUDIES:  Nerve conduction studies were performed on both lower extremities. The distal motor latencies and motor amplitudes for the peroneal and posterior tibial nerves were within normal limits. The nerve conduction velocities for these nerves were also normal. The H reflex latencies were normal. The sensory latencies for the peroneal nerves were within normal limits.   EMG STUDIES:  EMG study was performed on the left lower extremity:  The tibialis anterior muscle reveals 2 to 4K motor units with full recruitment. No fibrillations or positive waves were seen. The peroneus tertius muscle reveals 2 to 4K motor units with full recruitment. No fibrillations or positive waves were seen. The medial gastrocnemius muscle reveals 1 to 3K motor units with full recruitment. No fibrillations or positive waves were seen. The vastus lateralis muscle reveals 2 to 4K motor units with full recruitment. No fibrillations or positive waves were seen. The iliopsoas muscle reveals 2 to 4K motor units with full recruitment. No fibrillations or positive waves were seen. The biceps femoris muscle (long head) reveals 2 to 4K motor units with full recruitment. No fibrillations or positive waves were seen. The lumbosacral paraspinal muscles were tested at 3 levels, and revealed no abnormalities of insertional activity at all 3 levels tested. There was good relaxation.   IMPRESSION:  Nerve conduction studies done on both lower extremities were unremarkable without evidence of a  peripheral neuropathy. EMG evaluation of the left lower extremity was unremarkable, without evidence of an overlying lumbosacral radiculopathy.  Jill Alexanders MD 02/24/2014 10:55 AM  [facility] [address] [place], [place] [address]  Phone [phone] Fax [phone]

## 2014-02-24 NOTE — Progress Notes (Signed)
Please refer to EMG and nerve conduction study procedure note. 

## 2014-03-31 ENCOUNTER — Ambulatory Visit: Payer: 59 | Attending: Family Medicine | Admitting: Internal Medicine

## 2014-03-31 ENCOUNTER — Encounter: Payer: Self-pay | Admitting: Family Medicine

## 2014-03-31 VITALS — BP 131/91 | HR 70 | Temp 98.6°F | Resp 18 | Ht 61.0 in | Wt 197.0 lb

## 2014-03-31 DIAGNOSIS — M79605 Pain in left leg: Secondary | ICD-10-CM | POA: Diagnosis not present

## 2014-03-31 DIAGNOSIS — D509 Iron deficiency anemia, unspecified: Secondary | ICD-10-CM | POA: Insufficient documentation

## 2014-03-31 DIAGNOSIS — J302 Other seasonal allergic rhinitis: Secondary | ICD-10-CM | POA: Diagnosis not present

## 2014-03-31 DIAGNOSIS — I10 Essential (primary) hypertension: Secondary | ICD-10-CM

## 2014-03-31 DIAGNOSIS — M79604 Pain in right leg: Secondary | ICD-10-CM | POA: Diagnosis not present

## 2014-03-31 LAB — CBC
HCT: 36 % (ref 36.0–46.0)
Hemoglobin: 11.7 g/dL — ABNORMAL LOW (ref 12.0–15.0)
MCH: 28.1 pg (ref 26.0–34.0)
MCHC: 32.5 g/dL (ref 30.0–36.0)
MCV: 86.3 fL (ref 78.0–100.0)
MPV: 8.9 fL (ref 8.6–12.4)
Platelets: 268 10*3/uL (ref 150–400)
RBC: 4.17 MIL/uL (ref 3.87–5.11)
RDW: 13.7 % (ref 11.5–15.5)
WBC: 3.8 10*3/uL — ABNORMAL LOW (ref 4.0–10.5)

## 2014-03-31 MED ORDER — CETIRIZINE HCL 10 MG PO TABS: 10.0000 mg | ORAL_TABLET | Freq: Every day | ORAL | Status: DC

## 2014-03-31 MED ORDER — VALSARTAN-HYDROCHLOROTHIAZIDE 80-12.5 MG PO TABS: 1.0000 | ORAL_TABLET | Freq: Every day | ORAL | Status: DC

## 2014-03-31 MED ORDER — IBUPROFEN 600 MG PO TABS: 600.0000 mg | ORAL_TABLET | Freq: Two times a day (BID) | ORAL | Status: DC | PRN

## 2014-03-31 MED ORDER — FERROUS SULFATE 325 (65 FE) MG PO TABS: 325.0000 mg | ORAL_TABLET | Freq: Two times a day (BID) | ORAL | Status: DC

## 2014-03-31 NOTE — Patient Instructions (Signed)
DASH Eating Plan °DASH stands for "Dietary Approaches to Stop Hypertension." The DASH eating plan is a healthy eating plan that has been shown to reduce high blood pressure (hypertension). Additional health benefits may include reducing the risk of type 2 diabetes mellitus, heart disease, and stroke. The DASH eating plan may also help with weight loss. °WHAT DO I NEED TO KNOW ABOUT THE DASH EATING PLAN? °For the DASH eating plan, you will follow these general guidelines: °· Choose foods with a percent daily value for sodium of less than 5% (as listed on the food label). °· Use salt-free seasonings or herbs instead of table salt or sea salt. °· Check with your health care provider or pharmacist before using salt substitutes. °· Eat lower-sodium products, often labeled as "lower sodium" or "no salt added." °· Eat fresh foods. °· Eat more vegetables, fruits, and low-fat dairy products. °· Choose whole grains. Look for the word "whole" as the first word in the ingredient list. °· Choose fish and skinless chicken or turkey more often than red meat. Limit fish, poultry, and meat to 6 oz (170 g) each day. °· Limit sweets, desserts, sugars, and sugary drinks. °· Choose heart-healthy fats. °· Limit cheese to 1 oz (28 g) per day. °· Eat more home-cooked food and less restaurant, buffet, and fast food. °· Limit fried foods. °· Cook foods using methods other than frying. °· Limit canned vegetables. If you do use them, rinse them well to decrease the sodium. °· When eating at a restaurant, ask that your food be prepared with less salt, or no salt if possible. °WHAT FOODS CAN I EAT? °Seek help from a dietitian for individual calorie needs. °Grains °Whole grain or whole wheat bread. Brown rice. Whole grain or whole wheat pasta. Quinoa, bulgur, and whole grain cereals. Low-sodium cereals. Corn or whole wheat flour tortillas. Whole grain cornbread. Whole grain crackers. Low-sodium crackers. °Vegetables °Fresh or frozen vegetables  (raw, steamed, roasted, or grilled). Low-sodium or reduced-sodium tomato and vegetable juices. Low-sodium or reduced-sodium tomato sauce and paste. Low-sodium or reduced-sodium canned vegetables.  °Fruits °All fresh, canned (in natural juice), or frozen fruits. °Meat and Other Protein Products °Ground beef (85% or leaner), grass-fed beef, or beef trimmed of fat. Skinless chicken or turkey. Ground chicken or turkey. Pork trimmed of fat. All fish and seafood. Eggs. Dried beans, peas, or lentils. Unsalted nuts and seeds. Unsalted canned beans. °Dairy °Low-fat dairy products, such as skim or 1% milk, 2% or reduced-fat cheeses, low-fat ricotta or cottage cheese, or plain low-fat yogurt. Low-sodium or reduced-sodium cheeses. °Fats and Oils °Tub margarines without trans fats. Light or reduced-fat mayonnaise and salad dressings (reduced sodium). Avocado. Safflower, olive, or canola oils. Natural peanut or almond butter. °Other °Unsalted popcorn and pretzels. °The items listed above may not be a complete list of recommended foods or beverages. Contact your dietitian for more options. °WHAT FOODS ARE NOT RECOMMENDED? °Grains °White bread. White pasta. White rice. Refined cornbread. Bagels and croissants. Crackers that contain trans fat. °Vegetables °Creamed or fried vegetables. Vegetables in a cheese sauce. Regular canned vegetables. Regular canned tomato sauce and paste. Regular tomato and vegetable juices. °Fruits °Dried fruits. Canned fruit in light or heavy syrup. Fruit juice. °Meat and Other Protein Products °Fatty cuts of meat. Ribs, chicken wings, bacon, sausage, bologna, salami, chitterlings, fatback, hot dogs, bratwurst, and packaged luncheon meats. Salted nuts and seeds. Canned beans with salt. °Dairy °Whole or 2% milk, cream, half-and-half, and cream cheese. Whole-fat or sweetened yogurt. Full-fat   cheeses or blue cheese. Nondairy creamers and whipped toppings. Processed cheese, cheese spreads, or cheese  curds. °Condiments °Onion and garlic salt, seasoned salt, table salt, and sea salt. Canned and packaged gravies. Worcestershire sauce. Tartar sauce. Barbecue sauce. Teriyaki sauce. Soy sauce, including reduced sodium. Steak sauce. Fish sauce. Oyster sauce. Cocktail sauce. Horseradish. Ketchup and mustard. Meat flavorings and tenderizers. Bouillon cubes. Hot sauce. Tabasco sauce. Marinades. Taco seasonings. Relishes. °Fats and Oils °Butter, stick margarine, lard, shortening, ghee, and bacon fat. Coconut, palm kernel, or palm oils. Regular salad dressings. °Other °Pickles and olives. Salted popcorn and pretzels. °The items listed above may not be a complete list of foods and beverages to avoid. Contact your dietitian for more information. °WHERE CAN I FIND MORE INFORMATION? °National Heart, Lung, and [employer] Institute: www.nhlbi.nih.gov/health/health-topics/topics/dash/ °Document Released: 01/02/2011 Document Revised: 05/30/2013 Document Reviewed: 11/17/2012 °ExitCare® Patient Information ©2015 [employer]. This information is not intended to replace advice given to you by your health care provider. Make sure you discuss any questions you have with your health care provider. ° °

## 2014-03-31 NOTE — Progress Notes (Signed)
Complaining of eye red,swelling, soreness and itching. Facial itching No new product or intake  Stated taking Benadryl helping with itching

## 2014-03-31 NOTE — Progress Notes (Signed)
Patient ID: Andrea Wilkins, female   DOB: 06-13-1965, 49 y.o.   MRN: [id number] 1. HTN:  Medication: Diovan Home BP monitoring: checks randomly. Reports that she stopped taking two tablets because it was causing her blood pressure to drop too low Negative WSF:[id number], chest pain, palpitations, SOB, or blurred vision  Patient reports that for the past year since moving to [place] she has been having problems with facial swelling/itching, red eyes, and some cough. She reports that she recently went to see her optometrist who gave her Olopatadine drops for her eyes. She has only tried benadryl with minimal relief.   Patient reports that she has had problems with bilateral leg swelling especially after working on her feet all day. She states that her rheumatologist told her that it should not be affecting her legs. She has not tried anything for pain or swelling.    Social History reviewed: Smoker never Exercise not currently due to leg swelling and pain   Physical Exam  Neck: No JVD present.  Cardiovascular: Normal rate, regular rhythm and normal heart sounds.   Pulmonary/Chest: Effort normal and breath sounds normal.  Musculoskeletal: Normal range of motion. She exhibits edema (BLE above the knee).  Neurological: She is alert.  Skin: Skin is warm and dry.  Psychiatric: She has a normal mood and affect.   Fedora was seen today for allergic reaction.  Diagnoses and all orders for this visit:  IDA (iron deficiency anemia) Orders: -     CBC -     Refilled ferrous sulfate 325 (65 FE) MG tablet; Take 1 tablet (325 mg total) by mouth 2 (two) times daily with a meal.  Essential hypertension Orders: -     Refill valsartan-hydrochlorothiazide (DIOVAN HCT) 80-12.5 MG per tablet; Take 1 tablet by mouth daily.  Seasonal allergies Orders: -     Begin cetirizine (ZYRTEC) 10 MG tablet; Take 1 tablet (10 mg total) by mouth daily. She may continue eye drops for red eyes  Leg pain, bilateral Orders: -      ibuprofen (ADVIL,MOTRIN) 600 MG tablet; Take 1 tablet (600 mg total) by mouth 2 (two) times daily as needed. Explained to patient that this will help with swelling but is not to be taken long term due to the potential of raising her BP. Explained that once she gets swelling under control she may switch to tylenol and try applying heat pad at night.   Return in about 3 months (around 07/01/2014) for with Funches.  Chari Manning, NP 03/31/2014 12:57 PM

## 2014-03-31 NOTE — Progress Notes (Deleted)
   Subjective:    Patient ID: Andrea Wilkins, female    DOB: 03-16-1965, 49 y.o.   MRN: [id number]  HPI Comments: Had a [employer] smoothie and had lip swelling and peeling. Facial swelling and eye swelling in the morning with awakening.  Eye redness-eye doctor told her allergies and given Olopatadine drops. Some associated coughing.  Allergies have been present for one year after moving to [place]. She has just been managing.   Hypertension Treatments tried: Now only takes one tablet of BP medication versus two due to lhypotension.      Review of Systems     Objective:   Physical Exam        Assessment & Plan:

## 2014-04-05 ENCOUNTER — Telehealth: Payer: Self-pay | Admitting: *Deleted

## 2014-04-05 NOTE — Telephone Encounter (Signed)
Left voice message with normal lab If any question return call

## 2014-04-05 NOTE — Telephone Encounter (Signed)
-----   Message from Minerva Ends, MD sent at 04/02/2014  7:08 PM EST ----- Improved normocytic anemia. With 2 point rise in Hgb. Continue iron therapy.

## 2014-04-09 ENCOUNTER — Encounter: Payer: Self-pay | Admitting: Family Medicine

## 2014-10-20 ENCOUNTER — Ambulatory Visit (INDEPENDENT_AMBULATORY_CARE_PROVIDER_SITE_OTHER): Payer: 59 | Admitting: Family Medicine

## 2014-10-20 VITALS — BP 158/101 | HR 84 | Temp 98.6°F | Resp 16 | Ht 63.0 in | Wt 214.0 lb

## 2014-10-20 DIAGNOSIS — D509 Iron deficiency anemia, unspecified: Secondary | ICD-10-CM

## 2014-10-20 DIAGNOSIS — M6248 Contracture of muscle, other site: Secondary | ICD-10-CM

## 2014-10-20 DIAGNOSIS — G44219 Episodic tension-type headache, not intractable: Secondary | ICD-10-CM | POA: Diagnosis not present

## 2014-10-20 DIAGNOSIS — M62838 Other muscle spasm: Secondary | ICD-10-CM

## 2014-10-20 DIAGNOSIS — Z79899 Other long term (current) drug therapy: Secondary | ICD-10-CM

## 2014-10-20 DIAGNOSIS — R42 Dizziness and giddiness: Secondary | ICD-10-CM | POA: Diagnosis not present

## 2014-10-20 DIAGNOSIS — I10 Essential (primary) hypertension: Secondary | ICD-10-CM

## 2014-10-20 DIAGNOSIS — R110 Nausea: Secondary | ICD-10-CM

## 2014-10-20 LAB — POCT CBC
Granulocyte percent: 51.6 %G (ref 37–80)
HCT, POC: 39 % (ref 37.7–47.9)
Hemoglobin: 12.4 g/dL (ref 12.2–16.2)
Lymph, poc: 1.8 (ref 0.6–3.4)
MCH, POC: 27.6 pg (ref 27–31.2)
MCHC: 31.8 g/dL (ref 31.8–35.4)
MCV: 86.9 fL (ref 80–97)
MID (cbc): 0.1 (ref 0–0.9)
MPV: 6.6 fL (ref 0–99.8)
POC Granulocyte: 2.1 (ref 2–6.9)
POC LYMPH PERCENT: 45 %L (ref 10–50)
POC MID %: 3.4 %M (ref 0–12)
Platelet Count, POC: 304 10*3/uL (ref 142–424)
RBC: 4.48 M/uL (ref 4.04–5.48)
RDW, POC: 15.7 %
WBC: 4.1 10*3/uL — AB (ref 4.6–10.2)

## 2014-10-20 MED ORDER — ONDANSETRON 4 MG PO TBDP: 4.0000 mg | ORAL_TABLET | Freq: Three times a day (TID) | ORAL | Status: DC | PRN

## 2014-10-20 MED ORDER — CYCLOBENZAPRINE HCL 5 MG PO TABS: ORAL_TABLET | ORAL | Status: DC

## 2014-10-20 MED ORDER — ONDANSETRON 4 MG PO TBDP
4.0000 mg | ORAL_TABLET | Freq: Once | ORAL | Status: AC
Start: 2014-10-20 — End: 2014-10-20
  Administered 2014-10-20: 4 mg via ORAL

## 2014-10-20 NOTE — Patient Instructions (Signed)
Your symptoms may be due to tension headache, as they're similar to your symptoms in the past with this type of headache. This can also cause the soreness in tightness in the muscles of your neck and upper back.   You can try the muscle relaxant Flexeril and Zofran if needed for nausea, but if this is not helping your headache and nausea, and dizziness - I recommend evaluation in the emergency room tonight, as your blood pressure was also elevated here in the office (and you may need a CAT scan or other imaging depending on the evaluation by the emergency room). If you do have any worsening of your symptoms, be seen in the emergency room sooner.   I did check some electrolytes and thyroid test. You should receive a call or letter about your lab results within the next week to 10 days.   Return to the clinic or go to the nearest emergency room if any of your symptoms worsen or new symptoms occur.  Tension Headache A tension headache is a feeling of pain, pressure, or aching often felt over the front and sides of the head. The pain can be dull or can feel tight (constricting). It is the most common type of headache. Tension headaches are not normally associated with nausea or vomiting and do not get worse with physical activity. Tension headaches can last 30 minutes to several days.  CAUSES  The exact cause is not known, but it may be caused by chemicals and hormones in the brain that lead to pain. Tension headaches often begin after stress, anxiety, or depression. Other triggers may include:  Alcohol.  Caffeine (too much or withdrawal).  Respiratory infections (colds, flu, sinus infections).  Dental problems or teeth clenching.  Fatigue.  Holding your head and neck in one position too long while using a computer. SYMPTOMS   Pressure around the head.   Dull, aching head pain.   Pain felt over the front and sides of the head.   Tenderness in the muscles of the head, neck, and  shoulders. DIAGNOSIS  A tension headache is often diagnosed based on:   Symptoms.   Physical examination.   A CT scan or MRI of your head. These tests may be ordered if symptoms are severe or unusual. TREATMENT  Medicines may be given to help relieve symptoms.  HOME CARE INSTRUCTIONS   Only take over-the-counter or prescription medicines for pain or discomfort as directed by your caregiver.   Lie down in a dark, quiet room when you have a headache.   Keep a journal to find out what may be triggering your headaches. For example, write down:  What you eat and drink.  How much sleep you get.  Any change to your diet or medicines.  Try massage or other relaxation techniques.   Ice packs or heat applied to the head and neck can be used. Use these 3 to 4 times per day for 15 to 20 minutes each time, or as needed.   Limit stress.   Sit up straight, and do not tense your muscles.   Quit smoking if you smoke.  Limit alcohol use.  Decrease the amount of caffeine you drink, or stop drinking caffeine.  Eat and exercise regularly.  Get 7 to 9 hours of sleep, or as recommended by your caregiver.  Avoid excessive use of pain medicine as recurrent headaches can occur.  SEEK MEDICAL CARE IF:   You have problems with the medicines you were prescribed.  Your medicines do not work.  You have a change from the usual headache.  You have nausea or vomiting. SEEK IMMEDIATE MEDICAL CARE IF:   Your headache becomes severe.  You have a fever.  You have a stiff neck.  You have loss of vision.  You have muscular weakness or loss of muscle control.  You lose your balance or have trouble walking.  You feel faint or pass out.  You have severe symptoms that are different from your first symptoms. MAKE SURE YOU:   Understand these instructions.  Will watch your condition.  Will get help right away if you are not doing well or get worse. Document Released:  01/13/2005 Document Revised: 04/07/2011 Document Reviewed: 01/03/2011 [facility] Patient Information 2015 [place], [place]. This information is not intended to replace advice given to you by your health care provider. Make sure you discuss any questions you have with your health care provider.  Nausea and Vomiting Nausea is a sick feeling that often comes before throwing up (vomiting). Vomiting is a reflex where stomach contents come out of your mouth. Vomiting can cause severe loss of body fluids (dehydration). Children and elderly adults can become dehydrated quickly, especially if they also have diarrhea. Nausea and vomiting are symptoms of a condition or disease. It is important to find the cause of your symptoms. CAUSES   Direct irritation of the stomach lining. This irritation can result from increased acid production (gastroesophageal reflux disease), infection, food poisoning, taking certain medicines (such as nonsteroidal anti-inflammatory drugs), alcohol use, or tobacco use.  Signals from the brain.These signals could be caused by a headache, heat exposure, an inner ear disturbance, increased pressure in the brain from injury, infection, a tumor, or a concussion, pain, emotional stimulus, or metabolic problems.  An obstruction in the gastrointestinal tract (bowel obstruction).  Illnesses such as diabetes, hepatitis, gallbladder problems, appendicitis, kidney problems, cancer, sepsis, atypical symptoms of a heart attack, or eating disorders.  Medical treatments such as chemotherapy and radiation.  Receiving medicine that makes you sleep (general anesthetic) during surgery. DIAGNOSIS Your caregiver may ask for tests to be done if the problems do not improve after a few days. Tests may also be done if symptoms are severe or if the reason for the nausea and vomiting is not clear. Tests may include:  Urine tests.  Blood tests.  Stool tests.  Cultures (to look for evidence of  infection).  X-rays or other imaging studies. Test results can help your caregiver make decisions about treatment or the need for additional tests. TREATMENT You need to stay well hydrated. Drink frequently but in small amounts.You may wish to drink water, sports drinks, clear broth, or eat frozen ice pops or gelatin dessert to help stay hydrated.When you eat, eating slowly may help prevent nausea.There are also some antinausea medicines that may help prevent nausea. HOME CARE INSTRUCTIONS   Take all medicine as directed by your caregiver.  If you do not have an appetite, do not force yourself to eat. However, you must continue to drink fluids.  If you have an appetite, eat a normal diet unless your caregiver tells you differently.  Eat a variety of complex carbohydrates (rice, wheat, potatoes, bread), lean meats, yogurt, fruits, and vegetables.  Avoid high-fat foods because they are more difficult to digest.  Drink enough water and fluids to keep your urine clear or pale yellow.  If you are dehydrated, ask your caregiver for specific rehydration instructions. Signs of dehydration may include:  Severe  thirst.  Dry lips and mouth.  Dizziness.  Dark urine.  Decreasing urine frequency and amount.  Confusion.  Rapid breathing or pulse. SEEK IMMEDIATE MEDICAL CARE IF:   You have blood or brown flecks (like coffee grounds) in your vomit.  You have black or bloody stools.  You have a severe headache or stiff neck.  You are confused.  You have severe abdominal pain.  You have chest pain or trouble breathing.  You do not urinate at least once every 8 hours.  You develop cold or clammy skin.  You continue to vomit for longer than 24 to 48 hours.  You have a fever. MAKE SURE YOU:   Understand these instructions.  Will watch your condition.  Will get help right away if you are not doing well or get worse. Document Released: 01/13/2005 Document Revised: 04/07/2011  Document Reviewed: 06/12/2010 [facility] Patient Information 2015 [place], [place]. This information is not intended to replace advice given to you by your health care provider. Make sure you discuss any questions you have with your health care provider.  Dizziness Dizziness is a common problem. It is a feeling of unsteadiness or light-headedness. You may feel like you are about to faint. Dizziness can lead to injury if you stumble or fall. A person of any age group can suffer from dizziness, but dizziness is more common in older adults. CAUSES  Dizziness can be caused by many different things, including:  Middle ear problems.  Standing for too long.  Infections.  An allergic reaction.  Aging.  An emotional response to something, such as the sight of blood.  Side effects of medicines.  Tiredness.  Problems with circulation or blood pressure.  Excessive use of alcohol or medicines, or illegal drug use.  Breathing too fast (hyperventilation).  An irregular heart rhythm (arrhythmia).  A low red blood cell count (anemia).  Pregnancy.  Vomiting, diarrhea, fever, or other illnesses that cause body fluid loss (dehydration).  Diseases or conditions such as Parkinson's disease, high blood pressure (hypertension), diabetes, and thyroid problems.  Exposure to extreme heat. DIAGNOSIS  Your health care provider will ask about your symptoms, perform a physical exam, and perform an electrocardiogram (ECG) to record the electrical activity of your heart. Your health care provider may also perform other heart or blood tests to determine the cause of your dizziness. These may include:  Transthoracic echocardiogram (TTE). During echocardiography, sound waves are used to evaluate how blood flows through your heart.  Transesophageal echocardiogram (TEE).  Cardiac monitoring. This allows your health care provider to monitor your heart rate and rhythm in real time.  Holter monitor. This is a  portable device that records your heartbeat and can help diagnose heart arrhythmias. It allows your health care provider to track your heart activity for several days if needed.  Stress tests by exercise or by giving medicine that makes the heart beat faster. TREATMENT  Treatment of dizziness depends on the cause of your symptoms and can vary greatly. HOME CARE INSTRUCTIONS   Drink enough fluids to keep your urine clear or pale yellow. This is especially important in very hot weather. In older adults, it is also important in cold weather.  Take your medicine exactly as directed if your dizziness is caused by medicines. When taking blood pressure medicines, it is especially important to get up slowly.  Rise slowly from chairs and steady yourself until you feel okay.  In the morning, first sit up on the side of the bed.  When you feel okay, stand slowly while holding onto something until you know your balance is fine.  Move your legs often if you need to stand in one place for a long time. Tighten and relax your muscles in your legs while standing.  Have someone stay with you for 1-2 days if dizziness continues to be a problem. Do this until you feel you are well enough to stay alone. Have the person call your health care provider if he or she notices changes in you that are concerning.  Do not drive or use heavy machinery if you feel dizzy.  Do not drink alcohol. SEEK IMMEDIATE MEDICAL CARE IF:   Your dizziness or light-headedness gets worse.  You feel nauseous or vomit.  You have problems talking, walking, or using your arms, hands, or legs.  You feel weak.  You are not thinking clearly or you have trouble forming sentences. It may take a friend or family member to notice this.  You have chest pain, abdominal pain, shortness of breath, or sweating.  Your vision changes.  You notice any bleeding.  You have side effects from medicine that seems to be getting worse rather than  better. MAKE SURE YOU:   Understand these instructions.  Will watch your condition.  Will get help right away if you are not doing well or get worse. Document Released: 07/09/2000 Document Revised: 01/18/2013 Document Reviewed: 08/02/2010 [facility] Patient Information 2015 [place], [place]. This information is not intended to replace advice given to you by your health care provider. Make sure you discuss any questions you have with your health care provider.

## 2014-10-20 NOTE — Progress Notes (Addendum)
Subjective:    Patient ID: Andrea Wilkins, female    DOB: 04-Jan-1966, 49 y.o.   MRN: [id number] This chart was scribed for Andrea Ray, MD by Marti Sleigh, Medical Scribe. This patient was seen in Room 10 and the patient's care was started a 6:15 PM.  Chief Complaint  Patient presents with  . Headache    Onset a week ago  . Dizziness  . Nausea    HPI HPI Comments: Andrea Wilkins is a 49 y.o. female with a past hx of HTN, rheumatoid arthritis, anemia who presents to [facility] complaining of worsening bad HA, focussed in the front of her head and occiput, as well as tension in her neck and occiput for the last five days. She is also complaining of dizziness and nausea for the past three days. She denies slurred speech, vomiting, focal weakness, CP, SOB, or heart palpitations. She has no past hx of brain bleeding problems, or family hx of brain bleeds. Pt stopped taking iron supplements one month ago. She has taken Excedrin migraine, ibuprofen, and a OTC sinus medication without relief. Her BP has been normal outside the office.   In the past she has had HA with associated nausea and dizziness, which were treated with muscle relaxers.    This SmartLink has not been configured with any valid records.    Patient Active Problem List   Diagnosis Date Noted  . IDA (iron deficiency anemia) 10/27/2013  . Rheumatoid arthritis 10/27/2013  . Crohn disease 10/23/2012  . Asthma 10/23/2012  . Tension headache 06/14/2012  . Hypertension 06/14/2012   Past Medical History  Diagnosis Date  . Crohn's disease   . Arthritis 2015  . Hypertension Dx 2013   Past Surgical History  Procedure Laterality Date  . Abdominal hysterectomy  2008  . Small intestine surgery  2008  . Cesarean section  1988  . Breast surgery  2007   No Known Allergies Prior to Admission medications   Medication Sig Start Date End Date Taking? Authorizing Provider  Adalimumab (HUMIRA) 40 MG/0.8ML PSKT Inject 40 mg into the skin  every 14 (fourteen) days. On Ondays   Yes Historical Provider, MD  ibuprofen (ADVIL,MOTRIN) 600 MG tablet Take 1 tablet (600 mg total) by mouth 2 (two) times daily as needed. 03/31/14  Yes Lance Bosch, NP  valsartan-hydrochlorothiazide (DIOVAN HCT) 80-12.5 MG per tablet Take 1 tablet by mouth daily. 03/31/14  Yes Lance Bosch, NP   Social History   Social History  . Marital Status: Single    Spouse Name: N/A  . Number of Children: N/A  . Years of Education: N/A   Occupational History  . Not on file.   Social History Main Topics  . Smoking status: Never Smoker   . Smokeless tobacco: Never Used  . Alcohol Use: No  . Drug Use: No  . Sexual Activity: No   Other Topics Concern  . Not on file   Social History Narrative    Review of Systems  Constitutional: Negative for fever, chills and diaphoresis.  Respiratory: Negative for shortness of breath.   Cardiovascular: Negative for chest pain and palpitations.  Gastrointestinal: Positive for nausea.  Neurological: Positive for dizziness and headaches. Negative for speech difficulty, weakness and numbness.       Objective:   Physical Exam  Constitutional: She is oriented to person, place, and time. She appears well-developed and well-nourished. No distress.  Gait slow, but normal heel to toe.   HENT:  Head: Normocephalic  and atraumatic.  Right Ear: External ear normal.  Left Ear: External ear normal.  Eyes: EOM are normal. Pupils are equal, round, and reactive to light.  No nystagmus.  Neck: Neck supple. No thyromegaly present.  No carotid bruit. No lymphadenopathy in the neck. Diffuse tenderness and spasm in the back of neck into trapezius.   Cardiovascular: Normal rate, regular rhythm and normal heart sounds.   No murmur heard. Pulmonary/Chest: Effort normal and breath sounds normal. No respiratory distress.  Lungs clear.   Musculoskeletal: Normal range of motion.  Full ROM in shoulders and full rotator cuff strength, no  bony tenderness. No pronator drift, negative romberg.   Lymphadenopathy:    She has no cervical adenopathy.  Neurological: She is alert and oriented to person, place, and time. Coordination normal.  No pronator drift, negative Romberg  Skin: Skin is warm and dry. She is not diaphoretic.  Psychiatric: She has a normal mood and affect. Her behavior is normal.  Nursing note and vitals reviewed.   Filed Vitals:   10/20/14 1754  BP: 158/101  Pulse: 84  Temp: 98.6 F (37 C)  TempSrc: Oral  Resp: 16  Height: 5' 3"  (1.6 m)  Weight: 214 lb (97.07 kg)  SpO2: 98%    Results for orders placed or performed in visit on 10/20/14  POCT CBC  Result Value Ref Range   WBC 4.1 (A) 4.6 - 10.2 K/uL   Lymph, poc 1.8 0.6 - 3.4   POC LYMPH PERCENT 45.0 10 - 50 %L   MID (cbc) 0.1 0 - 0.9   POC MID % 3.4 0 - 12 %M   POC Granulocyte 2.1 2 - 6.9   Granulocyte percent 51.6 37 - 80 %G   RBC 4.48 4.04 - 5.48 M/uL   Hemoglobin 12.4 12.2 - 16.2 g/dL   HCT, POC 39.0 37.7 - 47.9 %   MCV 86.9 80 - 97 fL   MCH, POC 27.6 27 - 31.2 pg   MCHC 31.8 31.8 - 35.4 g/dL   RDW, POC 15.7 %   Platelet Count, POC 304 142 - 424 K/uL   MPV 6.6 0 - 99.8 fL   EKG:  Sinus rhythm, rate  57. T-wave inversion in lead III only, no acute findings otherwise.      Assessment & Plan:   Andrea Wilkins is a 49 y.o. female Dizziness - Plan: Basic metabolic panel, TSH, EKG [id number]  Episodic tension-type headache, not intractable  Muscle spasms of neck - Plan: cyclobenzaprine (FLEXERIL) 5 MG tablet  Anemia, iron deficiency - Plan: POCT CBC, IBC Panel  High risk medication use - Plan: Basic metabolic panel, TSH  Nausea without vomiting - Plan: EKG 12-Lead, ondansetron (ZOFRAN-ODT) disintegrating tablet 4 mg, ondansetron (ZOFRAN ODT) 4 MG disintegrating tablet  Hypertension, essential - Plan: EKG 12-Lead   Approximately one week of headache, with some associated dizziness and nausea past few days. Pain into the posterior  head and neck with spasm suspicious for tension headache. Similar symptoms in the past including nausea and dizziness that was treated as tension headache with resolution.  She has a nonfocal neurologic exam, no apparent concerning findings seen on EKG and although still slightly elevated, repeat blood pressure was improved.   -Option given of ER eval, but decided on initial trial of Flexeril for tension and spasm component, Zofran 4 mg 1 given in office, then if headache, dizziness, and nausea not resolving, recommended emergency department evaluation. ER/911 precautions discussed. Understanding expressed.   -BMP  and TSH drawn for dizziness component. Again, nonfocal neuro exam but ER precautions discussed.  Meds ordered this encounter  Medications  . ondansetron (ZOFRAN-ODT) disintegrating tablet 4 mg    Sig:   . cyclobenzaprine (FLEXERIL) 5 MG tablet    Sig: 1 pill by mouth up to every 8 hours as needed. Start with one pill by mouth each bedtime as needed due to sedation    Dispense:  15 tablet    Refill:  0  . ondansetron (ZOFRAN ODT) 4 MG disintegrating tablet    Sig: Take 1 tablet (4 mg total) by mouth every 8 (eight) hours as needed for nausea or vomiting.    Dispense:  10 tablet    Refill:  0   Patient Instructions  Your symptoms may be due to tension headache, as they're similar to your symptoms in the past with this type of headache. This can also cause the soreness in tightness in the muscles of your neck and upper back.   You can try the muscle relaxant Flexeril and Zofran if needed for nausea, but if this is not helping your headache and nausea, and dizziness - I recommend evaluation in the emergency room tonight, as your blood pressure was also elevated here in the office (and you may need a CAT scan or other imaging depending on the evaluation by the emergency room). If you do have any worsening of your symptoms, be seen in the emergency room sooner.   I did check some  electrolytes and thyroid test. You should receive a call or letter about your lab results within the next week to 10 days.   Return to the clinic or go to the nearest emergency room if any of your symptoms worsen or new symptoms occur.  Tension Headache A tension headache is a feeling of pain, pressure, or aching often felt over the front and sides of the head. The pain can be dull or can feel tight (constricting). It is the most common type of headache. Tension headaches are not normally associated with nausea or vomiting and do not get worse with physical activity. Tension headaches can last 30 minutes to several days.  CAUSES  The exact cause is not known, but it may be caused by chemicals and hormones in the brain that lead to pain. Tension headaches often begin after stress, anxiety, or depression. Other triggers may include:  Alcohol.  Caffeine (too much or withdrawal).  Respiratory infections (colds, flu, sinus infections).  Dental problems or teeth clenching.  Fatigue.  Holding your head and neck in one position too long while using a computer. SYMPTOMS   Pressure around the head.   Dull, aching head pain.   Pain felt over the front and sides of the head.   Tenderness in the muscles of the head, neck, and shoulders. DIAGNOSIS  A tension headache is often diagnosed based on:   Symptoms.   Physical examination.   A CT scan or MRI of your head. These tests may be ordered if symptoms are severe or unusual. TREATMENT  Medicines may be given to help relieve symptoms.  HOME CARE INSTRUCTIONS   Only take over-the-counter or prescription medicines for pain or discomfort as directed by your caregiver.   Lie down in a dark, quiet room when you have a headache.   Keep a journal to find out what may be triggering your headaches. For example, write down:  What you eat and drink.  How much sleep you get.  Any  change to your diet or medicines.  Try massage or other  relaxation techniques.   Ice packs or heat applied to the head and neck can be used. Use these 3 to 4 times per day for 15 to 20 minutes each time, or as needed.   Limit stress.   Sit up straight, and do not tense your muscles.   Quit smoking if you smoke.  Limit alcohol use.  Decrease the amount of caffeine you drink, or stop drinking caffeine.  Eat and exercise regularly.  Get 7 to 9 hours of sleep, or as recommended by your caregiver.  Avoid excessive use of pain medicine as recurrent headaches can occur.  SEEK MEDICAL CARE IF:   You have problems with the medicines you were prescribed.  Your medicines do not work.  You have a change from the usual headache.  You have nausea or vomiting. SEEK IMMEDIATE MEDICAL CARE IF:   Your headache becomes severe.  You have a fever.  You have a stiff neck.  You have loss of vision.  You have muscular weakness or loss of muscle control.  You lose your balance or have trouble walking.  You feel faint or pass out.  You have severe symptoms that are different from your first symptoms. MAKE SURE YOU:   Understand these instructions.  Will watch your condition.  Will get help right away if you are not doing well or get worse. Document Released: 01/13/2005 Document Revised: 04/07/2011 Document Reviewed: 01/03/2011 [facility] Patient Information 2015 [place], [place]. This information is not intended to replace advice given to you by your health care provider. Make sure you discuss any questions you have with your health care provider.  Nausea and Vomiting Nausea is a sick feeling that often comes before throwing up (vomiting). Vomiting is a reflex where stomach contents come out of your mouth. Vomiting can cause severe loss of body fluids (dehydration). Children and elderly adults can become dehydrated quickly, especially if they also have diarrhea. Nausea and vomiting are symptoms of a condition or disease. It is important  to find the cause of your symptoms. CAUSES   Direct irritation of the stomach lining. This irritation can result from increased acid production (gastroesophageal reflux disease), infection, food poisoning, taking certain medicines (such as nonsteroidal anti-inflammatory drugs), alcohol use, or tobacco use.  Signals from the brain.These signals could be caused by a headache, heat exposure, an inner ear disturbance, increased pressure in the brain from injury, infection, a tumor, or a concussion, pain, emotional stimulus, or metabolic problems.  An obstruction in the gastrointestinal tract (bowel obstruction).  Illnesses such as diabetes, hepatitis, gallbladder problems, appendicitis, kidney problems, cancer, sepsis, atypical symptoms of a heart attack, or eating disorders.  Medical treatments such as chemotherapy and radiation.  Receiving medicine that makes you sleep (general anesthetic) during surgery. DIAGNOSIS Your caregiver may ask for tests to be done if the problems do not improve after a few days. Tests may also be done if symptoms are severe or if the reason for the nausea and vomiting is not clear. Tests may include:  Urine tests.  Blood tests.  Stool tests.  Cultures (to look for evidence of infection).  X-rays or other imaging studies. Test results can help your caregiver make decisions about treatment or the need for additional tests. TREATMENT You need to stay well hydrated. Drink frequently but in small amounts.You may wish to drink water, sports drinks, clear broth, or eat frozen ice pops or gelatin dessert to  help stay hydrated.When you eat, eating slowly may help prevent nausea.There are also some antinausea medicines that may help prevent nausea. HOME CARE INSTRUCTIONS   Take all medicine as directed by your caregiver.  If you do not have an appetite, do not force yourself to eat. However, you must continue to drink fluids.  If you have an appetite, eat a  normal diet unless your caregiver tells you differently.  Eat a variety of complex carbohydrates (rice, wheat, potatoes, bread), lean meats, yogurt, fruits, and vegetables.  Avoid high-fat foods because they are more difficult to digest.  Drink enough water and fluids to keep your urine clear or pale yellow.  If you are dehydrated, ask your caregiver for specific rehydration instructions. Signs of dehydration may include:  Severe thirst.  Dry lips and mouth.  Dizziness.  Dark urine.  Decreasing urine frequency and amount.  Confusion.  Rapid breathing or pulse. SEEK IMMEDIATE MEDICAL CARE IF:   You have blood or brown flecks (like coffee grounds) in your vomit.  You have black or bloody stools.  You have a severe headache or stiff neck.  You are confused.  You have severe abdominal pain.  You have chest pain or trouble breathing.  You do not urinate at least once every 8 hours.  You develop cold or clammy skin.  You continue to vomit for longer than 24 to 48 hours.  You have a fever. MAKE SURE YOU:   Understand these instructions.  Will watch your condition.  Will get help right away if you are not doing well or get worse. Document Released: 01/13/2005 Document Revised: 04/07/2011 Document Reviewed: 06/12/2010 [facility] Patient Information 2015 [place], [place]. This information is not intended to replace advice given to you by your health care provider. Make sure you discuss any questions you have with your health care provider.  Dizziness Dizziness is a common problem. It is a feeling of unsteadiness or light-headedness. You may feel like you are about to faint. Dizziness can lead to injury if you stumble or fall. A person of any age group can suffer from dizziness, but dizziness is more common in older adults. CAUSES  Dizziness can be caused by many different things, including:  Middle ear problems.  Standing for too long.  Infections.  An allergic  reaction.  Aging.  An emotional response to something, such as the sight of blood.  Side effects of medicines.  Tiredness.  Problems with circulation or blood pressure.  Excessive use of alcohol or medicines, or illegal drug use.  Breathing too fast (hyperventilation).  An irregular heart rhythm (arrhythmia).  A low red blood cell count (anemia).  Pregnancy.  Vomiting, diarrhea, fever, or other illnesses that cause body fluid loss (dehydration).  Diseases or conditions such as Parkinson's disease, high blood pressure (hypertension), diabetes, and thyroid problems.  Exposure to extreme heat. DIAGNOSIS  Your health care provider will ask about your symptoms, perform a physical exam, and perform an electrocardiogram (ECG) to record the electrical activity of your heart. Your health care provider may also perform other heart or blood tests to determine the cause of your dizziness. These may include:  Transthoracic echocardiogram (TTE). During echocardiography, sound waves are used to evaluate how blood flows through your heart.  Transesophageal echocardiogram (TEE).  Cardiac monitoring. This allows your health care provider to monitor your heart rate and rhythm in real time.  Holter monitor. This is a portable device that records your heartbeat and can help diagnose heart arrhythmias. It  allows your health care provider to track your heart activity for several days if needed.  Stress tests by exercise or by giving medicine that makes the heart beat faster. TREATMENT  Treatment of dizziness depends on the cause of your symptoms and can vary greatly. HOME CARE INSTRUCTIONS   Drink enough fluids to keep your urine clear or pale yellow. This is especially important in very hot weather. In older adults, it is also important in cold weather.  Take your medicine exactly as directed if your dizziness is caused by medicines. When taking blood pressure medicines, it is especially  important to get up slowly.  Rise slowly from chairs and steady yourself until you feel okay.  In the morning, first sit up on the side of the bed. When you feel okay, stand slowly while holding onto something until you know your balance is fine.  Move your legs often if you need to stand in one place for a long time. Tighten and relax your muscles in your legs while standing.  Have someone stay with you for 1-2 days if dizziness continues to be a problem. Do this until you feel you are well enough to stay alone. Have the person call your health care provider if he or she notices changes in you that are concerning.  Do not drive or use heavy machinery if you feel dizzy.  Do not drink alcohol. SEEK IMMEDIATE MEDICAL CARE IF:   Your dizziness or light-headedness gets worse.  You feel nauseous or vomit.  You have problems talking, walking, or using your arms, hands, or legs.  You feel weak.  You are not thinking clearly or you have trouble forming sentences. It may take a friend or family member to notice this.  You have chest pain, abdominal pain, shortness of breath, or sweating.  Your vision changes.  You notice any bleeding.  You have side effects from medicine that seems to be getting worse rather than better. MAKE SURE YOU:   Understand these instructions.  Will watch your condition.  Will get help right away if you are not doing well or get worse. Document Released: 07/09/2000 Document Revised: 01/18/2013 Document Reviewed: 08/02/2010 [facility] Patient Information 2015 [place], [place]. This information is not intended to replace advice given to you by your health care provider. Make sure you discuss any questions you have with your health care provider.       By signing my name below, I, Judithe Modest, attest that this documentation has been prepared under the direction and in the presence of Andrea Ray, MD. Electronically Signed: Judithe Modest, ER  Scribe. 10/20/2014. 6:01 PM.   I personally performed the services described in this documentation, which was scribed in my presence. The recorded information has been reviewed and considered, and addended by me as needed.

## 2014-10-21 LAB — TSH: TSH: 1.816 u[IU]/mL (ref 0.350–4.500)

## 2014-10-25 LAB — IBC PANEL
%SAT: 12 % (ref 11–50)
TIBC: 288 ug/dL (ref 250–450)
UIBC: 253 ug/dL (ref 125–400)

## 2014-10-25 LAB — BASIC METABOLIC PANEL
BUN: 9 mg/dL (ref 7–25)
CO2: 27 mmol/L (ref 20–31)
Calcium: 9.3 mg/dL (ref 8.6–10.2)
Chloride: 104 mmol/L (ref 98–110)
Creat: 0.77 mg/dL (ref 0.50–1.10)
Glucose, Bld: 85 mg/dL (ref 65–99)
Potassium: 3.8 mmol/L (ref 3.5–5.3)
Sodium: 138 mmol/L (ref 135–146)

## 2014-10-25 LAB — IRON: Iron: 35 ug/dL — ABNORMAL LOW (ref 40–190)

## 2014-11-16 ENCOUNTER — Ambulatory Visit: Payer: 59 | Admitting: Family Medicine

## 2014-12-02 ENCOUNTER — Emergency Department (HOSPITAL_COMMUNITY): Payer: 59

## 2014-12-02 ENCOUNTER — Emergency Department (HOSPITAL_COMMUNITY)
Admission: EM | Admit: 2014-12-02 | Discharge: 2014-12-03 | Disposition: A | Discharge status: Home / Self Care | Payer: 59 | Attending: Emergency Medicine | Admitting: Emergency Medicine

## 2014-12-02 ENCOUNTER — Encounter (HOSPITAL_COMMUNITY): Payer: Self-pay | Admitting: *Deleted

## 2014-12-02 DIAGNOSIS — R930 Abnormal findings on diagnostic imaging of skull and head, not elsewhere classified: Secondary | ICD-10-CM

## 2014-12-02 DIAGNOSIS — I10 Essential (primary) hypertension: Secondary | ICD-10-CM | POA: Insufficient documentation

## 2014-12-02 DIAGNOSIS — R519 Headache, unspecified: Secondary | ICD-10-CM

## 2014-12-02 DIAGNOSIS — G935 Compression of brain: Secondary | ICD-10-CM | POA: Diagnosis not present

## 2014-12-02 DIAGNOSIS — M1990 Unspecified osteoarthritis, unspecified site: Secondary | ICD-10-CM | POA: Insufficient documentation

## 2014-12-02 DIAGNOSIS — R51 Headache: Secondary | ICD-10-CM | POA: Insufficient documentation

## 2014-12-02 DIAGNOSIS — Z79899 Other long term (current) drug therapy: Secondary | ICD-10-CM | POA: Insufficient documentation

## 2014-12-02 LAB — BASIC METABOLIC PANEL
Anion gap: 13 (ref 5–15)
BUN: 10 mg/dL (ref 6–20)
CO2: 24 mmol/L (ref 22–32)
Calcium: 9.3 mg/dL (ref 8.9–10.3)
Chloride: 100 mmol/L — ABNORMAL LOW (ref 101–111)
Creatinine, Ser: 0.72 mg/dL (ref 0.44–1.00)
GFR calc Af Amer: >60 mL/min (ref >60)
GFR calc non Af Amer: >60 mL/min (ref >60)
Glucose, Bld: 96 mg/dL (ref 65–99)
Potassium: 3.9 mmol/L (ref 3.5–5.1)
Sodium: 137 mmol/L (ref 135–145)

## 2014-12-02 LAB — CBC
HCT: 37.1 % (ref 36.0–46.0)
Hemoglobin: 12 g/dL (ref 12.0–15.0)
MCH: 28.4 pg (ref 26.0–34.0)
MCHC: 32.3 g/dL (ref 30.0–36.0)
MCV: 87.9 fL (ref 78.0–100.0)
Platelets: 217 10*3/uL (ref 150–400)
RBC: 4.22 MIL/uL (ref 3.87–5.11)
RDW: 13.2 % (ref 11.5–15.5)
WBC: 4.7 10*3/uL (ref 4.0–10.5)

## 2014-12-02 MED ORDER — DEXAMETHASONE SODIUM PHOSPHATE 10 MG/ML IJ SOLN
10.0000 mg | Freq: Once | INTRAMUSCULAR | Status: AC
  Administered 2014-12-02: 10 mg via INTRAVENOUS
  Filled 2014-12-02: qty 1

## 2014-12-02 MED ORDER — ONDANSETRON 4 MG PO TBDP
8.0000 mg | ORAL_TABLET | Freq: Once | ORAL | Status: AC
  Administered 2014-12-02: 8 mg via ORAL
  Filled 2014-12-02: qty 2

## 2014-12-02 MED ORDER — METOCLOPRAMIDE HCL 5 MG/ML IJ SOLN
5.0000 mg | Freq: Once | INTRAMUSCULAR | Status: AC
  Administered 2014-12-02: 5 mg via INTRAVENOUS
  Filled 2014-12-02: qty 2

## 2014-12-02 MED ORDER — GADOBENATE DIMEGLUMINE 529 MG/ML IV SOLN
20.0000 mL | Freq: Once | INTRAVENOUS | Status: AC | PRN
  Administered 2014-12-02: 20 mL via INTRAVENOUS

## 2014-12-02 MED ORDER — SODIUM CHLORIDE 0.9 % IV BOLUS (SEPSIS)
1000.0000 mL | Freq: Once | INTRAVENOUS | Status: AC
  Administered 2014-12-02: 1000 mL via INTRAVENOUS

## 2014-12-02 MED ORDER — DIPHENHYDRAMINE HCL 50 MG/ML IJ SOLN
25.0000 mg | Freq: Once | INTRAMUSCULAR | Status: AC
  Administered 2014-12-02: 25 mg via INTRAVENOUS
  Filled 2014-12-02: qty 1

## 2014-12-02 NOTE — ED Notes (Signed)
The pt is c/o a headache   With n and v since yesterday with some associated dizziness and arm numbness.  Hx of migraine headaches

## 2014-12-02 NOTE — ED Provider Notes (Signed)
CSN: [id number]     Arrival date & time 12/02/14  1803 History   First MD Initiated Contact with Patient 12/02/14 1918     Chief Complaint  Patient presents with  . Headache     (Consider location/radiation/quality/duration/timing/severity/associated sxs/prior Treatment) HPI   Andrea Wilkins is a 49 y.o. female with PMH significant for Crohn's disease, arthritis, HTN who presents with headache.  She reports a history of migraines, she states this episode is similar, but much worse.  Patient states she was woken from sleep yesterday morning with a frontal headache radiating to the back.  Described as constant, sharp pain.  She states "it feels like some drilled a hole and is trying to pull out my brain".  Aggravating factors include light.  She has tried ibuprofen and exedrin migraine, but neither helped.  Denies head injury or trauma.  Associated symptoms include nausea, dizziness, lightheadedness, neck stiffness, and intermittent left arm tingling (she states "it's like when you sleep on your arm funny").  Denies fever, slurred speech, facial droop, syncope, CP, SOB, vomiting, abdominal pain, or urinary symptoms.  LMP 2008--total hysterectomy.  She reports she has never had a head CT.   Past Medical History  Diagnosis Date  . Crohn's disease ([place])   . Arthritis 2015  . Hypertension Dx 2013   Past Surgical History  Procedure Laterality Date  . Abdominal hysterectomy  2008  . Small intestine surgery  2008  . Cesarean section  1988  . Breast surgery  2007   Family History  Problem Relation Age of Onset  . Hypertension Mother   . Diabetes Sister   . Hypertension Sister   . Diabetes Father    Social History  Substance Use Topics  . Smoking status: Never Smoker   . Smokeless tobacco: Never Used  . Alcohol Use: No   OB History    No data available     Review of Systems All other systems negative unless otherwise stated in HPI    Allergies  Review of patient's allergies  indicates no known allergies.  Home Medications   Prior to Admission medications   Medication Sig Start Date End Date Taking? Authorizing Provider  Adalimumab (HUMIRA) 40 MG/0.8ML PSKT Inject 40 mg into the skin every 14 (fourteen) days. On Ondays   Yes Historical Provider, MD  ibuprofen (ADVIL,MOTRIN) 600 MG tablet Take 1 tablet (600 mg total) by mouth 2 (two) times daily as needed. 03/31/14  Yes Lance Bosch, NP  ondansetron (ZOFRAN ODT) 4 MG disintegrating tablet Take 1 tablet (4 mg total) by mouth every 8 (eight) hours as needed for nausea or vomiting. 10/20/14  Yes Wendie Agreste, MD  valsartan-hydrochlorothiazide (DIOVAN HCT) 80-12.5 MG per tablet Take 1 tablet by mouth daily. 03/31/14  Yes Lance Bosch, NP  cyclobenzaprine (FLEXERIL) 5 MG tablet 1 pill by mouth up to every 8 hours as needed. Start with one pill by mouth each bedtime as needed due to sedation 10/20/14   Wendie Agreste, MD   BP 136/83 mmHg  Pulse 70  Temp(Src) 98.4 F (36.9 C) (Oral)  Resp 22  Ht 5' 3"  (1.6 m)  Wt 218 lb (98.884 kg)  BMI 38.63 kg/m2  SpO2 96% Physical Exam  Constitutional: She is oriented to person, place, and time. She appears well-developed and well-nourished.  HENT:  Head: Normocephalic and atraumatic.  Mouth/Throat: Oropharynx is clear and moist.  Eyes: Conjunctivae and EOM are normal. Pupils are equal, round, and reactive to light.  Neck: Neck supple. Muscular tenderness present. No rigidity. Decreased range of motion present. No Brudzinski's sign and no Kernig's sign noted.  Cardiovascular: Normal rate, regular rhythm and normal heart sounds.   No murmur heard. Pulmonary/Chest: Effort normal and breath sounds normal. No accessory muscle usage or stridor. No respiratory distress. She has no wheezes. She has no rhonchi. She has no rales.  Abdominal: Soft. Bowel sounds are normal. She exhibits no distension. There is no tenderness.  Lymphadenopathy:    She has no cervical adenopathy.   Neurological: She is alert and oriented to person, place, and time.  Mental Status:   AOx3.  Speech clear without dysarthria. Cranial Nerves:  I-not tested  II-PERRLA  III, IV, VI-EOMs intact  V-temporal and masseter strength intact  VII-symmetrical facial movements intact, no facial droop  VIII-hearing grossly intact bilaterally  IX, X-gag intact  XI-strength of sternomastoid and trapezius muscles 5/5  XII-tongue midline Motor:   Good muscle bulk and tone  Strength 5/5 bilaterally in upper and lower extremities   Cerebellar--RAMs, finger to nose intact  No pronator drift Sensory:  Intact in upper and lower extremities   Skin: Skin is warm and dry.  Psychiatric: She has a normal mood and affect. Her behavior is normal.    ED Course  Procedures (including critical care time) Labs Review Labs Reviewed  BASIC METABOLIC PANEL - Abnormal; Notable for the following:    Chloride 100 (*)    All other components within normal limits  CBC    Imaging Review Ct Head Wo Contrast  12/02/2014  CLINICAL DATA:  Frontal headache for 2 days with nausea and vomiting. Dizziness. Left upper extremity numbness. EXAM: CT HEAD WITHOUT CONTRAST TECHNIQUE: Contiguous axial images were obtained from the base of the skull through the vertex without intravenous contrast. COMPARISON:  None. FINDINGS: No evidence of extra-axial fluid collection. There is a questionable tiny hyperdense focus in the deep right frontal white matter (series 2/ image 18). No mass effect or midline shift. No CT evidence of acute infarction. Cerebral volume is age appropriate. No ventriculomegaly. The visualized paranasal sinuses are essentially clear. The mastoid air cells are unopacified. No evidence of calvarial fracture. IMPRESSION: Questionable tiny mildly hyperdense focus in the deep right frontal white matter. Otherwise normal head CT, with no mass-effect. Recommend brain MRI with and without intravenous contrast to exclude a  vascular malformation or mass lesion. Electronically Signed   By: Ilona Sorrel M.D.   On: 12/02/2014 20:50   Mr Jeri Cos XB Contrast  12/02/2014  CLINICAL DATA:  Headache, nausea and vomiting beginning yesterday, dizziness. Arm numbness. History of migraine headaches, Crohn's disease, hypertension. EXAM: MRI HEAD WITHOUT AND WITH CONTRAST TECHNIQUE: Multiplanar, multiecho pulse sequences of the brain and surrounding structures were obtained without and with intravenous contrast. CONTRAST:  56m MULTIHANCE GADOBENATE DIMEGLUMINE 529 MG/ML IV SOLN COMPARISON:  CT head December 02, 2014 FINDINGS: The ventricles and sulci are normal for patient's age. No mass lesions, mass effect. At least 3 subcentimeter white matter FLAIR T2 hyperintensities, 2 of which appear to radiate from the periventricular white matter. No abnormal parenchymal enhancement. No reduced diffusion to suggest acute ischemia. No susceptibility artifact to suggest hemorrhage. No abnormal extra-axial fluid collections. No extra-axial masses nor leptomeningeal enhancement. Normal major intracranial vascular flow voids seen at the skull base. Ocular globes and orbital contents are unremarkable though not tailored for evaluation. Symmetrically prominent superior ophthalmic veins can be seen with Valsalva. No suspicious calvarial bone marrow signal. No  abnormal sellar expansion. Cerebellar tonsils descend 7 mm below the foramen magnum with partial cerebral spinal fluid effacement, though are not pointed in appearance. Pre pontine cistern is not effaced. Mild maxillary sinus mucosal thickening. Mastoid air cells are well aerated. IMPRESSION: No acute intracranial process, specifically no evidence of hemorrhage or abnormality within RIGHT frontal lobe. Mild white matter changes could represent chronic small vessel ischemic disease though, can also be seen with migraine disorder or demyelination. Recommend follow-up MRI of the brain in 6 months to verify  stability of findings. Mild cerebellar tonsillar ectopia, equivocal for Chiari 1 malformation. Electronically Signed   By: Elon Alas M.D.   On: 12/02/2014 23:41   I have personally reviewed and evaluated these images and lab results as part of my medical decision-making.   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  Hx of migraines.  No head injury/trauma.  VSS, NAD.  No prior head CT.  No focal neurological deficits.  No meningeal signs.  No fever. Will obtain head CT, CBC, BMP.  Will give fluids.  Will give migraine cocktail.  Doubt meningitis.    Head CT shows no acute intracranial process.  Questionable tiny mildly hyperdense focus in deep right frontal white matter.  Will obtain MRI w/ and w/out contrast for further evaluation. MRI head shows no acute intracranial process, no evidence of hemorrhage.  Mild white matter changes could represent migraine or demyelination.  Recommend follow up MRI in 6 months.  Incidental finding of mild cerebellar tonsillar ectopia, equivocal for chiari 1 malformation.   Evaluation does not show pathology requring ongoing emergent intervention or admission. Pt is hemodynamically stable and mentating appropriately. Discussed findings/results and plan with patient/guardian, who agrees with plan. All questions answered. Return precautions discussed and outpatient follow up given.   Case has been discussed with and seen by Dr. Ashok Cordia who agrees with the above plan for  discharge.    Gloriann Loan, PA-C 12/03/14 0021  Lajean Saver, MD 12/03/14 [phone]

## 2014-12-02 NOTE — ED Notes (Signed)
Pt denies claustrophobia MRI notified

## 2014-12-03 NOTE — Discharge Instructions (Signed)
Migraine Headache A migraine headache is an intense, throbbing pain on one or both sides of your head. A migraine can last for 30 minutes to several hours. CAUSES  The exact cause of a migraine headache is not always known. However, a migraine may be caused when nerves in the brain become irritated and release chemicals that cause inflammation. This causes pain. Certain things may also trigger migraines, such as:  Alcohol.  Smoking.  Stress.  Menstruation.  Aged cheeses.  Foods or drinks that contain nitrates, glutamate, aspartame, or tyramine.  Lack of sleep.  Chocolate.  Caffeine.  Hunger.  Physical exertion.  Fatigue.  Medicines used to treat chest pain (nitroglycerine), birth control pills, estrogen, and some blood pressure medicines. SIGNS AND SYMPTOMS  Pain on one or both sides of your head.  Pulsating or throbbing pain.  Severe pain that prevents daily activities.  Pain that is aggravated by any physical activity.  Nausea, vomiting, or both.  Dizziness.  Pain with exposure to bright lights, loud noises, or activity.  General sensitivity to bright lights, loud noises, or smells. Before you get a migraine, you may get warning signs that a migraine is coming (aura). An aura may include:  Seeing flashing lights.  Seeing bright spots, halos, or zigzag lines.  Having tunnel vision or blurred vision.  Having feelings of numbness or tingling.  Having trouble talking.  Having muscle weakness. DIAGNOSIS  A migraine headache is often diagnosed based on:  Symptoms.  Physical exam.  A CT scan or MRI of your head. These imaging tests cannot diagnose migraines, but they can help rule out other causes of headaches. TREATMENT Medicines may be given for pain and nausea. Medicines can also be given to help prevent recurrent migraines.  HOME CARE INSTRUCTIONS  Only take over-the-counter or prescription medicines for pain or discomfort as directed by your  health care provider. The use of long-term narcotics is not recommended.  Lie down in a dark, quiet room when you have a migraine.  Keep a journal to find out what may trigger your migraine headaches. For example, write down:  What you eat and drink.  How much sleep you get.  Any change to your diet or medicines.  Limit alcohol consumption.  Quit smoking if you smoke.  Get 7-9 hours of sleep, or as recommended by your health care provider.  Limit stress.  Keep lights dim if bright lights bother you and make your migraines worse. SEEK IMMEDIATE MEDICAL CARE IF:   Your migraine becomes severe.  You have a fever.  You have a stiff neck.  You have vision loss.  You have muscular weakness or loss of muscle control.  You start losing your balance or have trouble walking.  You feel faint or pass out.  You have severe symptoms that are different from your first symptoms. MAKE SURE YOU:   Understand these instructions.  Will watch your condition.  Will get help right away if you are not doing well or get worse.   This information is not intended to replace advice given to you by your health care provider. Make sure you discuss any questions you have with your health care provider.   Document Released: 01/13/2005 Document Revised: 02/03/2014 Document Reviewed: 09/20/2012 Elsevier Interactive Patient Education 2016 [employer]  Chiari Malformation Chiari malformation (CM) is a type of brain abnormality that involves the parts of your brain that are important for balance (cerebellum) and basic body functions (brain stem). Normally, the cerebellum is  located in a space at the back part of the skull, just above the opening for the spinal cord (foramen magnum). With CM, part of the cerebellum is located below the foramen magnum instead. This can cause neck pain, balance problems, and other symptoms. There are five types of CM:  Type I.  This is the most common type. It  can cause cerebrospinal fluid (CSF) to not flow between your brain and spinal cord as it normally should.  This type may not cause symptoms, and it often goes unnoticed.  Type II.  This type is present at birth (congenital), and it usually involves a condition in which the spine does not form properly (spina bifida).  Type II also involves having a larger portion of brain structures move down and push through (herniate) the foramen magnum into the spinal canal.  Type III.  This type is more severe than Types I and II, and it is the least common type.  Type III often occurs with a type of congenital disability in which an opening in the skull causes the cerebellum and brain stem and their coverings to bulge out in a sac (encephalocele).  Type IV.  This is also a severe form of CM.  Part of the cerebellum may be missing, and parts of the spine and skull may be visible.  Type 0.  In this type, the cerebellum does not herniate into or below the foramen magnum. However, abnormal flow of CSF between the brain and spinal cord creates a collection of fluid inside the spinal cord (syrinx). This may cause symptoms. CAUSES CM is most commonly congenital. CM can occur when the skull forms incorrectly and provides less space for the cerebellum than normal. In rare cases, CM may also develop later in life (acquired CM or secondary CM). These cases may be caused by:  Injury.  Infection. Abnormal structure or pressure develops in the brain, and this pushes the cerebellum down into the foramen magnum. RISK FACTORS Congenital CM is more likely to occur in:  Females.  People who have a family history of CM. SYMPTOMS CM may not cause any symptoms. Symptoms may also vary depending on the type and severity of CM. Symptoms may also come and go. The most common symptom is a severe headache in the back of the head. Headache pain may come and go. It may also spread to your neck and shoulders. The pain  may be worse when you cough, sneeze, or strain. Other symptoms include:  Difficulty balancing.  Loss of coordination.  Trouble swallowing or speaking.  Muscle weakness.  Feeling dizzy.  Ringing in the ears.  Fainting.  Trouble sleeping.  Fatigue.  Tingling or burning sensations in the fingers or toes.  Hearing problems.  Vision problems.  Vomiting.  Depression.  Seizures. These are only present in more severe types of CM. DIAGNOSIS This condition may be diagnosed with a medical history and physical exam. This may include tests to check your balance and your memory. You may also have other tests, including:  X-ray.  CT scan.  MRI. TREATMENT Treatment for this condition depends on your symptoms and the type of CM that you have. If you have headaches or neck pain, you may be treated with pain medicine or massage therapy. If you have symptoms of CM that are severe or are getting worse, your health care provider may recommend surgery to control your symptoms and prevent the malformation from getting worse. If you do not have symptoms, you  may not need treatment. HOME CARE INSTRUCTIONS  Take medicines only as directed by your health care provider.  Avoid activities that involve straining and heavy lifting.  Consider joining a CM support group.  Keep all follow-up visits as directed by your health care provider. This is important. SEEK MEDICAL CARE IF:  You have new symptoms.  Your symptoms get worse. SEEK IMMEDIATE MEDICAL CARE IF:  You have seizures that are new or different than before.   This information is not intended to replace advice given to you by your health care provider. Make sure you discuss any questions you have with your health care provider.   Document Released: 01/03/2002 Document Revised: 05/30/2014 Document Reviewed: 09/21/2013 Elsevier Interactive Patient Education [employer].

## 2014-12-03 NOTE — ED Notes (Signed)
Pt stable, ambulatory, states understanding of discharge instructions 

## 2014-12-04 ENCOUNTER — Telehealth: Payer: Self-pay | Admitting: Family Medicine

## 2014-12-04 NOTE — Telephone Encounter (Signed)
Patient went to the ER on Saturday for sharp pain in head , muscle spasms, numbness in arm. Patient was hoping to be seen asap but dr Adrian Blackwater is out 3 weeks for appointments. Patient wasn't prescribed any medications and is hoping to be prescribed something for her headaches. Please follow up with pt. Thank you.

## 2014-12-08 ENCOUNTER — Telehealth: Payer: Self-pay | Admitting: Family Medicine

## 2014-12-08 NOTE — Telephone Encounter (Signed)
Patient has been having severe headaches. Patient would like to know if she can be prescribed something to help. Patient has been taking Excedrin Migrane Please follow up with patient.

## 2014-12-15 ENCOUNTER — Ambulatory Visit: Payer: 59 | Attending: Family Medicine | Admitting: Family Medicine

## 2014-12-15 ENCOUNTER — Encounter: Payer: Self-pay | Admitting: Family Medicine

## 2014-12-15 DIAGNOSIS — M6248 Contracture of muscle, other site: Secondary | ICD-10-CM

## 2014-12-15 DIAGNOSIS — I10 Essential (primary) hypertension: Secondary | ICD-10-CM | POA: Insufficient documentation

## 2014-12-15 DIAGNOSIS — R51 Headache: Secondary | ICD-10-CM | POA: Diagnosis not present

## 2014-12-15 DIAGNOSIS — M62838 Other muscle spasm: Secondary | ICD-10-CM

## 2014-12-15 DIAGNOSIS — G935 Compression of brain: Secondary | ICD-10-CM | POA: Insufficient documentation

## 2014-12-15 DIAGNOSIS — Z79899 Other long term (current) drug therapy: Secondary | ICD-10-CM | POA: Insufficient documentation

## 2014-12-15 DIAGNOSIS — R930 Abnormal findings on diagnostic imaging of skull and head, not elsewhere classified: Secondary | ICD-10-CM | POA: Insufficient documentation

## 2014-12-15 DIAGNOSIS — R519 Headache, unspecified: Secondary | ICD-10-CM | POA: Insufficient documentation

## 2014-12-15 DIAGNOSIS — M792 Neuralgia and neuritis, unspecified: Secondary | ICD-10-CM

## 2014-12-15 MED ORDER — CYCLOBENZAPRINE HCL 10 MG PO TABS: 10.0000 mg | ORAL_TABLET | Freq: Three times a day (TID) | ORAL | Status: DC | PRN

## 2014-12-15 MED ORDER — HYDROCHLOROTHIAZIDE 12.5 MG PO CAPS: 12.5000 mg | ORAL_CAPSULE | Freq: Every day | ORAL | Status: DC

## 2014-12-15 MED ORDER — GABAPENTIN 300 MG PO CAPS: 300.0000 mg | ORAL_CAPSULE | Freq: Every day | ORAL | Status: DC

## 2014-12-15 MED ORDER — ACETAMINOPHEN-CODEINE #3 300-30 MG PO TABS: 1.0000 | ORAL_TABLET | Freq: Three times a day (TID) | ORAL | Status: DC | PRN

## 2014-12-15 NOTE — Progress Notes (Signed)
ED F/U migraine Pain scale #8 No tobacco user  No suicide though in the past two sweeks

## 2014-12-15 NOTE — Patient Instructions (Signed)
Andrea Wilkins was seen today for hospitalization follow-up.  Diagnoses and all orders for this visit:  Chiari malformation type I ([place]) -     Ambulatory referral to Neurosurgery  Essential hypertension -     hydrochlorothiazide (MICROZIDE) 12.5 MG capsule; Take 1 capsule (12.5 mg total) by mouth daily.  Muscle spasms of neck -     cyclobenzaprine (FLEXERIL) 10 MG tablet; Take 1 tablet (10 mg total) by mouth 3 (three) times daily as needed (neck and upper back pain).  Occipital headache -     cyclobenzaprine (FLEXERIL) 10 MG tablet; Take 1 tablet (10 mg total) by mouth 3 (three) times daily as needed (neck and upper back pain). -     acetaminophen-codeine (TYLENOL #3) 300-30 MG tablet; Take 1-2 tablets by mouth every 8 (eight) hours as needed for severe pain (headache).  Radicular pain in left arm -     cyclobenzaprine (FLEXERIL) 10 MG tablet; Take 1 tablet (10 mg total) by mouth 3 (three) times daily as needed (neck and upper back pain). -     gabapentin (NEURONTIN) 300 MG capsule; Take 1 capsule (300 mg total) by mouth at bedtime. -     acetaminophen-codeine (TYLENOL #3) 300-30 MG tablet; Take 1-2 tablets by mouth every 8 (eight) hours as needed for severe pain (headache).  f/u in 2 weeks for headache   Dr. Adrian Blackwater   Chiari Malformation Chiari malformation (CM) is a type of brain abnormality that involves the parts of your brain that are important for balance (cerebellum) and basic body functions (brain stem). Normally, the cerebellum is located in a space at the back part of the skull, just above the opening for the spinal cord (foramen magnum). With CM, part of the cerebellum is located below the foramen magnum instead. This can cause neck pain, balance problems, and other symptoms. There are five types of CM:  Type I.  This is the most common type. It can cause cerebrospinal fluid (CSF) to not flow between your brain and spinal cord as it normally should.  This type may not cause  symptoms, and it often goes unnoticed.  Type II.  This type is present at birth (congenital), and it usually involves a condition in which the spine does not form properly (spina bifida).  Type II also involves having a larger portion of brain structures move down and push through (herniate) the foramen magnum into the spinal canal.  Type III.  This type is more severe than Types I and II, and it is the least common type.  Type III often occurs with a type of congenital disability in which an opening in the skull causes the cerebellum and brain stem and their coverings to bulge out in a sac (encephalocele).  Type IV.  This is also a severe form of CM.  Part of the cerebellum may be missing, and parts of the spine and skull may be visible.  Type 0.  In this type, the cerebellum does not herniate into or below the foramen magnum. However, abnormal flow of CSF between the brain and spinal cord creates a collection of fluid inside the spinal cord (syrinx). This may cause symptoms. CAUSES CM is most commonly congenital. CM can occur when the skull forms incorrectly and provides less space for the cerebellum than normal. In rare cases, CM may also develop later in life (acquired CM or secondary CM). These cases may be caused by:  Injury.  Infection. Abnormal structure or pressure develops in the brain,  and this pushes the cerebellum down into the foramen magnum. RISK FACTORS Congenital CM is more likely to occur in:  Females.  People who have a family history of CM. SYMPTOMS CM may not cause any symptoms. Symptoms may also vary depending on the type and severity of CM. Symptoms may also come and go. The most common symptom is a severe headache in the back of the head. Headache pain may come and go. It may also spread to your neck and shoulders. The pain may be worse when you cough, sneeze, or strain. Other symptoms include:  Difficulty balancing.  Loss of coordination.  Trouble  swallowing or speaking.  Muscle weakness.  Feeling dizzy.  Ringing in the ears.  Fainting.  Trouble sleeping.  Fatigue.  Tingling or burning sensations in the fingers or toes.  Hearing problems.  Vision problems.  Vomiting.  Depression.  Seizures. These are only present in more severe types of CM. DIAGNOSIS This condition may be diagnosed with a medical history and physical exam. This may include tests to check your balance and your memory. You may also have other tests, including:  X-ray.  CT scan.  MRI. TREATMENT Treatment for this condition depends on your symptoms and the type of CM that you have. If you have headaches or neck pain, you may be treated with pain medicine or massage therapy. If you have symptoms of CM that are severe or are getting worse, your health care provider may recommend surgery to control your symptoms and prevent the malformation from getting worse. If you do not have symptoms, you may not need treatment. HOME CARE INSTRUCTIONS  Take medicines only as directed by your health care provider.  Avoid activities that involve straining and heavy lifting.  Consider joining a CM support group.  Keep all follow-up visits as directed by your health care provider. This is important. SEEK MEDICAL CARE IF:  You have new symptoms.  Your symptoms get worse. SEEK IMMEDIATE MEDICAL CARE IF:  You have seizures that are new or different than before.   This information is not intended to replace advice given to you by your health care provider. Make sure you discuss any questions you have with your health care provider.   Document Released: 01/03/2002 Document Revised: 05/30/2014 Document Reviewed: 09/21/2013 Elsevier Interactive Patient Education [employer].

## 2014-12-15 NOTE — Progress Notes (Signed)
Subjective:  Patient ID: Andrea Wilkins, female    DOB: [birth year]  Age: 49 y.o. MRN: [id number]  CC: Hospitalization Follow-up   HPI Marya Lowden presents for    1. ED f/u headache: occipital headache. That radiates to upper back and shoulders. Daily. Was severe in ED. Had CT head and MRI in ED. MRI revealed the following:  IMPRESSION: No acute intracranial process, specifically no evidence of hemorrhage or abnormality within RIGHT frontal lobe.  Mild white matter changes could represent chronic small vessel ischemic disease though, can also be seen with migraine disorder or demyelination. Recommend follow-up MRI of the brain in 6 months to verify stability of findings.  Mild cerebellar tonsillar ectopia, equivocal for Chiari 1 malformation.  She has persistent headache. When severe it is associated with nausea and dizziness. She also has tingling and numbness down L arm at times. When this is severe it is associated with weakness in hand.   2. HTN: of of diovan-HCTZ for a few days. Felt BP was low when she took it regularly. Has HA as per above. No CP, SOB or leg swelling.   Social History  Substance Use Topics  . Smoking status: Never Smoker   . Smokeless tobacco: Never Used  . Alcohol Use: No    Outpatient Prescriptions Prior to Visit  Medication Sig Dispense Refill  . Adalimumab (HUMIRA) 40 MG/0.8ML PSKT Inject 40 mg into the skin every 14 (fourteen) days. On Ondays    . valsartan-hydrochlorothiazide (DIOVAN HCT) 80-12.5 MG per tablet Take 1 tablet by mouth daily. 60 tablet 4  . cyclobenzaprine (FLEXERIL) 5 MG tablet 1 pill by mouth up to every 8 hours as needed. Start with one pill by mouth each bedtime as needed due to sedation (Patient not taking: Reported on 12/15/2014) 15 tablet 0  . ibuprofen (ADVIL,MOTRIN) 600 MG tablet Take 1 tablet (600 mg total) by mouth 2 (two) times daily as needed. (Patient not taking: Reported on 12/15/2014) 60 tablet 0  . ondansetron  (ZOFRAN ODT) 4 MG disintegrating tablet Take 1 tablet (4 mg total) by mouth every 8 (eight) hours as needed for nausea or vomiting. (Patient not taking: Reported on 12/15/2014) 10 tablet 0   No facility-administered medications prior to visit.    ROS Review of Systems  Constitutional: Negative for fever and chills.  Eyes: Positive for visual disturbance.  Respiratory: Negative for shortness of breath.   Cardiovascular: Negative for chest pain.  Gastrointestinal: Positive for nausea. Negative for vomiting, abdominal pain and blood in stool.  Musculoskeletal: Positive for neck pain. Negative for back pain and arthralgias.  Skin: Negative for rash.  Allergic/Immunologic: Negative for immunocompromised state.  Neurological: Positive for headaches.  Hematological: Negative for adenopathy. Does not bruise/bleed easily.  Psychiatric/Behavioral: Negative for suicidal ideas and dysphoric mood.    Objective:  BP 148/93 mmHg  Pulse 87  Temp(Src) 98.8 F (37.1 C) (Oral)  Resp 16  Ht 5' 3"  (1.6 m)  Wt 219 lb (99.338 kg)  BMI 38.80 kg/m2  SpO2 94%  BP/Weight 12/15/2014 12/03/2014 24/04/100  Systolic BP - 725 -  Diastolic BP - 83 -  Wt. (Lbs) 219 - 218  BMI 38.8 - 38.63   Physical Exam  Constitutional: She is oriented to person, place, and time. She appears well-developed and well-nourished. No distress.  HENT:  Head: Normocephalic and atraumatic.  Eyes: Left conjunctiva is injected.  Neck: Muscular tenderness present. Decreased range of motion present.  Cardiovascular: Normal rate, regular rhythm, normal heart sounds  and intact distal pulses.   Pulmonary/Chest: Effort normal and breath sounds normal.  Musculoskeletal: She exhibits no edema.  Neurological: She is alert and oriented to person, place, and time. No cranial nerve deficit. She exhibits normal muscle tone. Coordination normal.  Skin: Skin is warm and dry. No rash noted.  Psychiatric: She has a normal mood and affect.     Assessment & Plan:   Problem List Items Addressed This Visit    Chiari malformation type I ([place])   Relevant Orders   Ambulatory referral to Neurosurgery   Hypertension (Chronic)   Relevant Medications   hydrochlorothiazide (MICROZIDE) 12.5 MG capsule   Occipital headache   Relevant Medications   cyclobenzaprine (FLEXERIL) 10 MG tablet   gabapentin (NEURONTIN) 300 MG capsule   acetaminophen-codeine (TYLENOL #3) 300-30 MG tablet   Radicular pain in left arm   Relevant Medications   cyclobenzaprine (FLEXERIL) 10 MG tablet   gabapentin (NEURONTIN) 300 MG capsule   acetaminophen-codeine (TYLENOL #3) 300-30 MG tablet    Other Visit Diagnoses    Muscle spasms of neck        Relevant Medications    cyclobenzaprine (FLEXERIL) 10 MG tablet       No orders of the defined types were placed in this encounter.    Follow-up: No Follow-up on file.   Boykin Nearing MD

## 2014-12-19 ENCOUNTER — Ambulatory Visit: Payer: 59 | Admitting: Family Medicine

## 2014-12-29 ENCOUNTER — Ambulatory Visit: Payer: 59 | Admitting: Family Medicine

## 2015-01-15 ENCOUNTER — Encounter: Payer: Self-pay | Admitting: Family Medicine

## 2015-01-15 ENCOUNTER — Ambulatory Visit: Payer: 59 | Attending: Family Medicine | Admitting: Family Medicine

## 2015-01-15 VITALS — BP 152/94 | HR 93 | Temp 98.0°F | Resp 16 | Ht 63.0 in

## 2015-01-15 DIAGNOSIS — R51 Headache: Secondary | ICD-10-CM | POA: Diagnosis not present

## 2015-01-15 DIAGNOSIS — Z79899 Other long term (current) drug therapy: Secondary | ICD-10-CM | POA: Diagnosis not present

## 2015-01-15 DIAGNOSIS — I10 Essential (primary) hypertension: Secondary | ICD-10-CM | POA: Insufficient documentation

## 2015-01-15 DIAGNOSIS — R350 Frequency of micturition: Secondary | ICD-10-CM | POA: Diagnosis not present

## 2015-01-15 DIAGNOSIS — R519 Headache, unspecified: Secondary | ICD-10-CM

## 2015-01-15 DIAGNOSIS — R110 Nausea: Secondary | ICD-10-CM | POA: Diagnosis not present

## 2015-01-15 MED ORDER — VERAPAMIL HCL ER 120 MG PO CP24: 120.0000 mg | ORAL_CAPSULE | Freq: Every day | ORAL | Status: DC

## 2015-01-15 MED ORDER — HYDROCHLOROTHIAZIDE 25 MG PO TABS: 25.0000 mg | ORAL_TABLET | Freq: Every day | ORAL | Status: DC

## 2015-01-15 MED ORDER — ONDANSETRON 8 MG PO TBDP: 8.0000 mg | ORAL_TABLET | Freq: Three times a day (TID) | ORAL | Status: DC | PRN

## 2015-01-15 MED ORDER — ACETAMINOPHEN-CODEINE #3 300-30 MG PO TABS: 1.0000 | ORAL_TABLET | Freq: Three times a day (TID) | ORAL | Status: DC | PRN

## 2015-01-15 NOTE — Progress Notes (Signed)
Patient ID: Andrea Wilkins, female   DOB: [birth year], 49 y.o.   MRN: [id number]   Subjective:  Patient ID: Andrea Wilkins, female    DOB: 05-08-1965  Age: 49 y.o. MRN: [id number]  CC: Headache   HPI Andrea Wilkins presents for   1. CHRONIC HYPERTENSION  Disease Monitoring  Blood pressure range: sometimes high, SBP was 160 at neurology office   Chest pain: no   Dyspnea: no   Claudication: no   Medication compliance: yes taking HCTZ 25 mg since last week  Medication Side Effects  Lightheadedness: yes   Urinary frequency: yes   Edema: no    2. Headaches: occipital headache with neck pain. Has chiari malformation. Has seen neurosurgery who is planning to image C spine to evaluate for spinal stenosis or. Pain is improved with tylenol #3 and gabapentin. Pain is sometimes associated with nausea without emesis. No fever.   Social History  Substance Use Topics  . Smoking status: Never Smoker   . Smokeless tobacco: Never Used  . Alcohol Use: No    Outpatient Prescriptions Prior to Visit  Medication Sig Dispense Refill  . acetaminophen-codeine (TYLENOL #3) 300-30 MG tablet Take 1-2 tablets by mouth every 8 (eight) hours as needed for severe pain (headache). 60 tablet 0  . Adalimumab (HUMIRA) 40 MG/0.8ML PSKT Inject 40 mg into the skin every 14 (fourteen) days. On Ondays    . cyclobenzaprine (FLEXERIL) 10 MG tablet Take 1 tablet (10 mg total) by mouth 3 (three) times daily as needed (neck and upper back pain). 60 tablet 2  . gabapentin (NEURONTIN) 300 MG capsule Take 1 capsule (300 mg total) by mouth at bedtime. 30 capsule 2  . ondansetron (ZOFRAN ODT) 4 MG disintegrating tablet Take 1 tablet (4 mg total) by mouth every 8 (eight) hours as needed for nausea or vomiting. 10 tablet 0  . valsartan-hydrochlorothiazide (DIOVAN HCT) 80-12.5 MG per tablet Take 1 tablet by mouth daily. 60 tablet 4  . hydrochlorothiazide (MICROZIDE) 12.5 MG capsule Take 1 capsule (12.5 mg total) by mouth daily. (Patient  not taking: Reported on 01/15/2015) 30 capsule 5  . ibuprofen (ADVIL,MOTRIN) 600 MG tablet Take 1 tablet (600 mg total) by mouth 2 (two) times daily as needed. (Patient not taking: Reported on 12/15/2014) 60 tablet 0  . methylPREDNISolone (MEDROL) 4 MG tablet Take 4 mg by mouth daily. Reported on 01/15/2015    . prednisoLONE acetate (PRED FORTE) 1 % ophthalmic suspension 1 drop 4 (four) times daily. Reported on 01/15/2015     No facility-administered medications prior to visit.    ROS Review of Systems  Constitutional: Negative for fever and chills.  Eyes: Negative for visual disturbance.  Respiratory: Negative for shortness of breath.   Cardiovascular: Negative for chest pain.  Gastrointestinal: Negative for abdominal pain and blood in stool.  Musculoskeletal: Negative for back pain and arthralgias.  Skin: Negative for rash.  Allergic/Immunologic: Negative for immunocompromised state.  Hematological: Negative for adenopathy. Does not bruise/bleed easily.  Psychiatric/Behavioral: Negative for suicidal ideas and dysphoric mood.    Objective:  BP 152/94 mmHg  Pulse 93  Temp(Src) 98 F (36.7 C) (Oral)  Resp 16  Ht 5' 3"  (1.6 m)  SpO2 100%  BP/Weight 01/15/2015 12/15/2014 15/0/5697  Systolic BP 948 016 553  Diastolic BP 94 93 83  Wt. (Lbs) - 219 -  BMI - 38.8 -    Physical Exam  Constitutional: She is oriented to person, place, and time. She appears well-developed and well-nourished. No distress.  HENT:  Head: Normocephalic and atraumatic.  Cardiovascular: Normal rate, regular rhythm, normal heart sounds and intact distal pulses.   Pulmonary/Chest: Effort normal and breath sounds normal.  Musculoskeletal: She exhibits no edema.  Neurological: She is alert and oriented to person, place, and time.  Skin: Skin is warm and dry. No rash noted.  Psychiatric: She has a normal mood and affect.     Assessment & Plan:   Problem List Items Addressed This Visit    Hypertension -  Primary (Chronic)    A: HTN and chronic HA. BP elevated P: HCTZ 25 mg Start verapamil 120 mg daily and taper to goal BP < 140/90       Relevant Medications   hydrochlorothiazide (HYDRODIURIL) 25 MG tablet   verapamil (VERELAN PM) 120 MG 24 hr capsule   Occipital headache (Chronic)    A: slightly improved. Patient is being evaluated by neurosurgery P: Refilled tylenol #3 Treat HTN       Relevant Medications   acetaminophen-codeine (TYLENOL #3) 300-30 MG tablet   verapamil (VERELAN PM) 120 MG 24 hr capsule    Other Visit Diagnoses    Nausea without vomiting        Relevant Medications    ondansetron (ZOFRAN-ODT) 8 MG disintegrating tablet       Meds ordered this encounter  Medications  . hydrochlorothiazide (HYDRODIURIL) 25 MG tablet    Sig: Take 1 tablet (25 mg total) by mouth daily.    Dispense:  30 tablet    Refill:  3    Dc stop HCTZ 12.5 mg    Follow-up: No Follow-up on file.   Boykin Nearing MD

## 2015-01-15 NOTE — Progress Notes (Signed)
F/U HA and BP Pain scale 5 No tobacco user  No suicidal thoughts in the past two weeks

## 2015-01-15 NOTE — Patient Instructions (Signed)
Andrea Wilkins was seen today for headache.  Diagnoses and all orders for this visit:  Essential hypertension -     hydrochlorothiazide (HYDRODIURIL) 25 MG tablet; Take 1 tablet (25 mg total) by mouth daily. -     Discontinue: verapamil (VERELAN PM) 120 MG 24 hr capsule; Take 1 capsule (120 mg total) by mouth at bedtime. -     verapamil (VERELAN PM) 120 MG 24 hr capsule; Take 1 capsule (120 mg total) by mouth at bedtime.  Occipital headache -     acetaminophen-codeine (TYLENOL #3) 300-30 MG tablet; Take 1-2 tablets by mouth every 8 (eight) hours as needed for severe pain (headache).  Radicular pain in left arm -     acetaminophen-codeine (TYLENOL #3) 300-30 MG tablet; Take 1-2 tablets by mouth every 8 (eight) hours as needed for severe pain (headache).  Nausea without vomiting -     Discontinue: ondansetron (ZOFRAN-ODT) 8 MG disintegrating tablet; Take 1 tablet (8 mg total) by mouth every 8 (eight) hours as needed for nausea or vomiting. -     ondansetron (ZOFRAN-ODT) 8 MG disintegrating tablet; Take 1 tablet (8 mg total) by mouth every 8 (eight) hours as needed for nausea or vomiting.    F.u in 3 weeks with pharmacy for BP check F.u with me in 2 months, plan for Tdap at f.u   Dr. Adrian Blackwater

## 2015-01-15 NOTE — Assessment & Plan Note (Addendum)
A: slightly improved. Patient is being evaluated by neurosurgery P: Refilled tylenol #3 and zofran for prn use  Treat HTN

## 2015-01-15 NOTE — Assessment & Plan Note (Signed)
A: HTN and chronic HA. BP elevated P: HCTZ 25 mg Start verapamil 120 mg daily and taper to goal BP < 140/90

## 2015-02-06 ENCOUNTER — Encounter: Payer: 59 | Admitting: Pharmacist

## 2015-03-05 ENCOUNTER — Encounter: Payer: Self-pay | Admitting: Family Medicine

## 2015-03-05 ENCOUNTER — Ambulatory Visit: Payer: 59 | Attending: Family Medicine | Admitting: Family Medicine

## 2015-03-05 ENCOUNTER — Other Ambulatory Visit: Payer: Self-pay

## 2015-03-05 VITALS — BP 140/82 | HR 101 | Temp 98.7°F | Resp 16 | Ht 63.0 in | Wt 218.0 lb

## 2015-03-05 DIAGNOSIS — R51 Headache: Secondary | ICD-10-CM | POA: Insufficient documentation

## 2015-03-05 DIAGNOSIS — Z114 Encounter for screening for human immunodeficiency virus [HIV]: Secondary | ICD-10-CM | POA: Diagnosis not present

## 2015-03-05 DIAGNOSIS — B353 Tinea pedis: Secondary | ICD-10-CM | POA: Insufficient documentation

## 2015-03-05 DIAGNOSIS — R21 Rash and other nonspecific skin eruption: Secondary | ICD-10-CM | POA: Insufficient documentation

## 2015-03-05 DIAGNOSIS — I10 Essential (primary) hypertension: Secondary | ICD-10-CM | POA: Diagnosis not present

## 2015-03-05 DIAGNOSIS — R0789 Other chest pain: Secondary | ICD-10-CM | POA: Diagnosis not present

## 2015-03-05 DIAGNOSIS — G4700 Insomnia, unspecified: Secondary | ICD-10-CM | POA: Diagnosis not present

## 2015-03-05 DIAGNOSIS — B354 Tinea corporis: Secondary | ICD-10-CM | POA: Diagnosis not present

## 2015-03-05 DIAGNOSIS — Z0000 Encounter for general adult medical examination without abnormal findings: Secondary | ICD-10-CM | POA: Diagnosis not present

## 2015-03-05 DIAGNOSIS — Z79899 Other long term (current) drug therapy: Secondary | ICD-10-CM | POA: Diagnosis not present

## 2015-03-05 LAB — POCT GLYCOSYLATED HEMOGLOBIN (HGB A1C): Hemoglobin A1C: 4.9

## 2015-03-05 MED ORDER — TRAZODONE HCL 50 MG PO TABS: 25.0000 mg | ORAL_TABLET | Freq: Every evening | ORAL | Status: DC | PRN

## 2015-03-05 MED ORDER — KETOCONAZOLE 2 % EX CREA: 1.0000 "application " | TOPICAL_CREAM | Freq: Every day | CUTANEOUS | Status: DC

## 2015-03-05 MED ORDER — HYDROCHLOROTHIAZIDE 25 MG PO TABS: 25.0000 mg | ORAL_TABLET | Freq: Every day | ORAL | Status: DC

## 2015-03-05 MED ORDER — VERAPAMIL HCL ER 180 MG PO CP24: 180.0000 mg | ORAL_CAPSULE | Freq: Every day | ORAL | Status: DC

## 2015-03-05 MED ORDER — ZOLPIDEM TARTRATE 5 MG PO TABS: 5.0000 mg | ORAL_TABLET | Freq: Every evening | ORAL | Status: DC | PRN

## 2015-03-05 NOTE — Assessment & Plan Note (Signed)
nizoral cream

## 2015-03-05 NOTE — Assessment & Plan Note (Signed)
A; at goal now with report of elevated BP P: Increase verapamil to 180 mg  Continue HCTZ 25 mg  Cards referral for chest discomfort with HTN

## 2015-03-05 NOTE — Progress Notes (Signed)
F/U HA  Stated still with HA Pain scale # 6 No tobacco user  No suicidal thoughts in the past two weeks

## 2015-03-05 NOTE — Patient Instructions (Addendum)
Rupal was seen today for migraine.  Diagnoses and all orders for this visit:  Health care maintenance -     HgB A1c  Essential hypertension -     hydrochlorothiazide (HYDRODIURIL) 25 MG tablet; Take 1 tablet (25 mg total) by mouth daily. -     verapamil (VERELAN PM) 180 MG 24 hr capsule; Take 1 capsule (180 mg total) by mouth at bedtime. -     Cortisol, urine, free  Screening for HIV (human immunodeficiency virus) -     HIV antibody (with reflex)  Tinea corporis -     ketoconazole (NIZORAL) 2 % cream; Apply 1 application topically daily.  Tinea pedis of both feet -     ketoconazole (NIZORAL) 2 % cream; Apply 1 application topically daily.  Insomnia -     traZODone (DESYREL) 50 MG tablet; Take 0.5-1 tablets (25-50 mg total) by mouth at bedtime as needed for sleep. -     zolpidem (AMBIEN) 5 MG tablet; Take 1 tablet (5 mg total) by mouth at bedtime as needed for sleep.    Bring back 24 hr urine sample   F/u in 4 weeks for BP check and rash follow up  Dr. Adrian Blackwater

## 2015-03-05 NOTE — Progress Notes (Signed)
Subjective:  Patient ID: Andrea Wilkins, female    DOB: 07/13/1965  Age: 50 y.o. MRN: [id number]  CC: Hypertension; Rash; and Insomnia   HPI [name] presents for   1. HTN: she is compliant with HCTZ 25 mg and verapamil 120 mg daily. One day at work she became nausea with severe headache. No emesis or fever. BP was very high. She took extra verapamil that evening. She has chest discomfort that comes in goes. Substernal and L chest. She say it feels like muscle spasm. No palpitations or weakness.   2. Rash: around belly button, under pannus and on feet. For may weeks. Scaly rash. Non-pruritic. She denies frequent URIs. She reports last screening HIV in 2014 was negative. She has not been sexually active.   3. Insomnia: she is having trouble falling asleep. She sleeps 1-2 hrs per night. She denies worry.   Social History  Substance Use Topics  . Smoking status: Never Smoker   . Smokeless tobacco: Never Used  . Alcohol Use: No    Outpatient Prescriptions Prior to Visit  Medication Sig Dispense Refill  . acetaminophen-codeine (TYLENOL #3) 300-30 MG tablet Take 1-2 tablets by mouth every 8 (eight) hours as needed for severe pain (headache). 60 tablet 0  . Adalimumab (HUMIRA) 40 MG/0.8ML PSKT Inject 40 mg into the skin every 14 (fourteen) days. On Ondays    . cyclobenzaprine (FLEXERIL) 10 MG tablet Take 1 tablet (10 mg total) by mouth 3 (three) times daily as needed (neck and upper back pain). 60 tablet 2  . gabapentin (NEURONTIN) 300 MG capsule Take 1 capsule (300 mg total) by mouth at bedtime. 30 capsule 2  . hydrochlorothiazide (HYDRODIURIL) 25 MG tablet Take 1 tablet (25 mg total) by mouth daily. 30 tablet 3  . ondansetron (ZOFRAN-ODT) 8 MG disintegrating tablet Take 1 tablet (8 mg total) by mouth every 8 (eight) hours as needed for nausea or vomiting. 20 tablet 0  . prednisoLONE acetate (PRED FORTE) 1 % ophthalmic suspension 1 drop 4 (four) times daily. Reported on 01/15/2015    .  verapamil (VERELAN PM) 120 MG 24 hr capsule Take 1 capsule (120 mg total) by mouth at bedtime. 30 capsule 5   No facility-administered medications prior to visit.    ROS Review of Systems  Constitutional: Positive for appetite change. Negative for fever and chills.  Eyes: Negative for visual disturbance.  Respiratory: Negative for shortness of breath.   Cardiovascular: Positive for chest pain.  Gastrointestinal: Negative for abdominal pain and blood in stool.  Musculoskeletal: Negative for back pain and arthralgias.  Skin: Positive for rash.  Allergic/Immunologic: Negative for immunocompromised state.  Neurological: Positive for headaches.  Hematological: Negative for adenopathy. Does not bruise/bleed easily.  Psychiatric/Behavioral: Positive for sleep disturbance. Negative for suicidal ideas and dysphoric mood.    Objective:  BP 140/82 mmHg  Pulse 101  Temp(Src) 98.7 F (37.1 C) (Oral)  Resp 16  Ht 5' 3"  (1.6 m)  Wt 218 lb (98.884 kg)  BMI 38.63 kg/m2  SpO2 96%  BP/Weight 03/05/2015 01/15/2015 93/26/7124  Systolic BP 580 998 338  Diastolic BP 82 94 93  Wt. (Lbs) 218 - 219  BMI 38.63 - 38.8   Wt Readings from Last 3 Encounters:  03/05/15 218 lb (98.884 kg)  12/15/14 219 lb (99.338 kg)  12/02/14 218 lb (98.884 kg)    Physical Exam  Constitutional: She is oriented to person, place, and time. She appears well-developed and well-nourished. No distress.  HENT:  Head: Normocephalic and atraumatic.  Cardiovascular: Normal rate, regular rhythm, normal heart sounds and intact distal pulses.   Pulmonary/Chest: Effort normal and breath sounds normal.  Musculoskeletal: She exhibits no edema.  Neurological: She is alert and oriented to person, place, and time.  Skin: Skin is warm and dry. Rash noted.     Psychiatric: She has a normal mood and affect.   Lab Results  Component Value Date   HGBA1C 4.90 03/05/2015   EKG: normal EKG, normal sinus rhythm.  Depression screen  [facility] 2/9 03/05/2015 01/15/2015 12/15/2014  Decreased Interest 0 0 0  Down, Depressed, Hopeless 0 0 0  PHQ - 2 Score 0 0 0  Altered sleeping 3 - -  Tired, decreased energy 3 - -  Change in appetite 3 - -  Feeling bad or failure about yourself  0 - -  Trouble concentrating 0 - -  Moving slowly or fidgety/restless 0 - -  Suicidal thoughts 0 - -  PHQ-9 Score 9 - -   GAD 7 : Generalized Anxiety Score 03/05/2015  Nervous, Anxious, on Edge 0  Control/stop worrying 0  Worry too much - different things 0  Trouble relaxing 3  Restless 0  Easily annoyed or irritable 0  Afraid - awful might happen 0  Total GAD 7 Score 3      Assessment & Plan:    Destani was seen today for hypertension, rash and insomnia.  Diagnoses and all orders for this visit:  Health care maintenance -     HgB A1c  Essential hypertension -     hydrochlorothiazide (HYDRODIURIL) 25 MG tablet; Take 1 tablet (25 mg total) by mouth daily. -     verapamil (VERELAN PM) 180 MG 24 hr capsule; Take 1 capsule (180 mg total) by mouth at bedtime. -     Cancel: Cortisol, urine, free -     Lipid Panel -     Cortisol, urine, 24 hour; Future -     Ambulatory referral to Cardiology  Screening for HIV (human immunodeficiency virus) -     HIV antibody (with reflex)  Tinea corporis -     ketoconazole (NIZORAL) 2 % cream; Apply 1 application topically daily.  Tinea pedis of both feet -     ketoconazole (NIZORAL) 2 % cream; Apply 1 application topically daily.  Insomnia -     traZODone (DESYREL) 50 MG tablet; Take 0.5-1 tablets (25-50 mg total) by mouth at bedtime as needed for sleep. -     zolpidem (AMBIEN) 5 MG tablet; Take 1 tablet (5 mg total) by mouth at bedtime as needed for sleep.  Chest discomfort -     Ambulatory referral to Cardiology   No orders of the defined types were placed in this encounter.    Follow-up: No Follow-up on file.   Boykin Nearing MD

## 2015-03-05 NOTE — Assessment & Plan Note (Signed)
Insomnia  Trazodone 25-50 mg nightly ambien 5 mg prn short term

## 2015-03-06 LAB — LIPID PANEL
Cholesterol: 249 mg/dL — ABNORMAL HIGH (ref 125–200)
HDL: 69 mg/dL (ref >=46)
LDL Cholesterol: 163 mg/dL — ABNORMAL HIGH (ref <130)
Total CHOL/HDL Ratio: 3.6 Ratio (ref <=5.0)
Triglycerides: 86 mg/dL (ref <150)
VLDL: 17 mg/dL (ref <30)

## 2015-03-06 LAB — HIV 1/2 CONFIRMATION
HIV-1 antibody: NEGATIVE
HIV-2 Ab: NEGATIVE

## 2015-03-06 LAB — HIV ANTIBODY (ROUTINE TESTING W REFLEX): HIV 1&2 Ab, 4th Generation: REACTIVE — AB

## 2015-03-07 LAB — CORTISOL URINE FREE BY LC-MS/MS

## 2015-03-12 ENCOUNTER — Ambulatory Visit: Payer: 59 | Attending: Family Medicine

## 2015-03-12 DIAGNOSIS — I10 Essential (primary) hypertension: Secondary | ICD-10-CM

## 2015-03-12 LAB — HIV-1 RNA, QUALITATIVE, TMA: HIV-1 RNA, Qualitative, TMA: NOT DETECTED

## 2015-03-13 ENCOUNTER — Telehealth: Payer: Self-pay | Admitting: Family Medicine

## 2015-03-13 DIAGNOSIS — Z21 Asymptomatic human immunodeficiency virus [HIV] infection status: Secondary | ICD-10-CM | POA: Insufficient documentation

## 2015-03-13 NOTE — Assessment & Plan Note (Signed)
creening HIV reactive   ID automatically notified and provided f/u recommendations Plan for repeat her HIV antibody and check at HIV RNA   Per patient,  Last HIV [facility], [facility] was [facility].   She will return tomorrow, 03/13/15 at 4:30 PM for f/u labs.

## 2015-03-13 NOTE — Telephone Encounter (Signed)
Called patient Verified name and DOB  Informed patient that screening HIV reactive   ID automatically notified and provided f/u recommendations Plan for repeat her HIV antibody and check at HIV RNA   Per patient,  Last HIV 2015, done at health department was negative.   She will return tomorrow, 03/14/15 at 4:30 PM for f/u labs.  Please add patient to lab schedule

## 2015-03-14 ENCOUNTER — Telehealth: Payer: Self-pay | Admitting: *Deleted

## 2015-03-14 NOTE — Telephone Encounter (Signed)
Pt. Returned call. Pt. Stated she can be called after 3:30 p.m. Please f/u

## 2015-03-14 NOTE — Telephone Encounter (Signed)
LVM to return call.

## 2015-03-14 NOTE — Telephone Encounter (Signed)
-----   Message from Boykin Nearing, MD sent at 03/08/2015  2:27 PM EST ----- Cholesterol is elevated but is low risk for heart disease and does not need statin I recommend increase in fish or taking OTC fish oil and weight loss

## 2015-03-15 LAB — CORTISOL, URINE, 24 HOUR
Cortisol (Ur), Free: 4.4 mcg/24 h (ref 4.0–50.0)
RESULTS RECEIVED: 1.35 g/(24.h) (ref 0.63–2.50)

## 2015-03-19 ENCOUNTER — Ambulatory Visit: Payer: 59 | Attending: Family Medicine

## 2015-03-19 DIAGNOSIS — Z21 Asymptomatic human immunodeficiency virus [HIV] infection status: Secondary | ICD-10-CM

## 2015-03-20 LAB — HIV-1 RNA QUANT-NO REFLEX-BLD
HIV 1 RNA Quant: <20 copies/mL (ref <20)
HIV-1 RNA Quant, Log: <1.3 Log copies/mL (ref <1.30)

## 2015-03-20 LAB — HIV ANTIBODY (ROUTINE TESTING W REFLEX): HIV 1&2 Ab, 4th Generation: REACTIVE — AB

## 2015-03-20 LAB — HIV 1/2 CONFIRMATION
HIV-1 antibody: NEGATIVE
HIV-2 Ab: NEGATIVE

## 2015-03-21 NOTE — Telephone Encounter (Signed)
Date of birth verified by pt  Lab results given  Advised to increase fish or otc fish oil  Exercise 3 x per week if possible  Pt verbalized understanding

## 2015-03-25 LAB — HIV-1 RNA, QUALITATIVE, TMA: HIV-1 RNA, Qualitative, TMA: NOT DETECTED

## 2015-03-26 ENCOUNTER — Telehealth: Payer: Self-pay | Admitting: Family Medicine

## 2015-03-26 NOTE — Telephone Encounter (Signed)
Called patient Verified name and DOB Confirmatory HIV test was negative She had a false positive No f/u testing or treatment needed  Also urine cortisol normal   Patient agrees with plan and voices understanding

## 2015-04-02 ENCOUNTER — Encounter: Payer: Self-pay | Admitting: Family Medicine

## 2015-04-02 ENCOUNTER — Ambulatory Visit: Payer: 59 | Attending: Family Medicine | Admitting: Family Medicine

## 2015-04-02 VITALS — BP 143/86 | HR 84 | Temp 98.3°F | Resp 16 | Ht 63.0 in | Wt 214.0 lb

## 2015-04-02 DIAGNOSIS — M069 Rheumatoid arthritis, unspecified: Secondary | ICD-10-CM | POA: Diagnosis not present

## 2015-04-02 DIAGNOSIS — R21 Rash and other nonspecific skin eruption: Secondary | ICD-10-CM | POA: Insufficient documentation

## 2015-04-02 DIAGNOSIS — L538 Other specified erythematous conditions: Secondary | ICD-10-CM

## 2015-04-02 DIAGNOSIS — L539 Erythematous condition, unspecified: Secondary | ICD-10-CM | POA: Diagnosis not present

## 2015-04-02 DIAGNOSIS — L304 Erythema intertrigo: Secondary | ICD-10-CM | POA: Diagnosis not present

## 2015-04-02 DIAGNOSIS — K5090 Crohn's disease, unspecified, without complications: Secondary | ICD-10-CM | POA: Insufficient documentation

## 2015-04-02 DIAGNOSIS — Z79899 Other long term (current) drug therapy: Secondary | ICD-10-CM | POA: Insufficient documentation

## 2015-04-02 DIAGNOSIS — I10 Essential (primary) hypertension: Secondary | ICD-10-CM | POA: Insufficient documentation

## 2015-04-02 MED ORDER — FLUCONAZOLE 150 MG PO TABS: 150.0000 mg | ORAL_TABLET | ORAL | Status: DC

## 2015-04-02 NOTE — Progress Notes (Signed)
F/U HTN Rash  No changes on rash.  Taking medication as prescribed  No tobacco user  No suicidal thought in the past two week

## 2015-04-02 NOTE — Patient Instructions (Addendum)
Andrea Wilkins was seen today for hypertension and rash.  Diagnoses and all orders for this visit:  Scaly patch rash -     Ambulatory referral to Dermatology  Macular erythematous rash -     Ambulatory referral to Dermatology  Intertrigo -     fluconazole (DIFLUCAN) 150 MG tablet; Take 1 tablet (150 mg total) by mouth once a week.    Take HCTZ in the morning to prevent getting up at night to pee.  Continue benadryl for rash on hands which appears to be a contact allergy. You can also try zyretc or claritin  F/u in 6 weeks for HTN and rash  Dr. Sherran Needs Intertrigo is a skin condition that occurs in between folds of skin in places on the body that rub together a lot and do not get much ventilation. It is caused by heat, moisture, friction, sweat retention, and lack of air circulation, which produces red, irritated patches and, sometimes, scaling or drainage. People who have diabetes, who are obese, or who have treatment with antibiotics are at increased risk for intertrigo. The most common sites for intertrigo to occur include:  The groin.  The breasts.  The armpits.  Folds of abdominal skin.  Webbed spaces between the fingers or toes. Intertrigo may be aggravated by:  Sweat.  Feces.  Yeast or bacteria that are present near skin folds.  Urine.  Vaginal discharge. HOME CARE INSTRUCTIONS  The following steps can be taken to reduce friction and keep the affected area cool and dry:  Expose skin folds to the air.  Keep deep skin folds separated with cotton or linen cloth. Avoid tight fitting clothing that could cause chafing.  Wear open-toed shoes or sandals to help reduce moisture between the toes.  Apply absorbent powders to affected areas as directed by your caregiver.  Apply over-the-counter barrier pastes, such as zinc oxide, as directed by your caregiver.  If you develop a fungal infection in the affected area, your caregiver may have you use antifungal  creams. SEEK MEDICAL CARE IF:   The rash is not improving after 1 week of treatment.  The rash is getting worse (more red, more swollen, more painful, or spreading).  You have a fever or chills. MAKE SURE YOU:   Understand these instructions.  Will watch your condition.  Will get help right away if you are not doing well or get worse.   This information is not intended to replace advice given to you by your health care provider. Make sure you discuss any questions you have with your health care provider.   Document Released: 01/13/2005 Document Revised: 04/07/2011 Document Reviewed: 07/17/2014 Elsevier Interactive Patient Education [employer].

## 2015-04-02 NOTE — Assessment & Plan Note (Signed)
A: BP slightly above goal P: Continue current regimen Change HCTZ to AM

## 2015-04-02 NOTE — Assessment & Plan Note (Signed)
A; macular rash on hands appears allergic P: Continue antihistamine

## 2015-04-02 NOTE — Progress Notes (Signed)
Subjective:  Patient ID: Andrea Wilkins, female    DOB: [birth year]  Age: 50 y.o. MRN: [id number]  CC: No chief complaint on file.   HPI Andrea Wilkins presents for   1. HTN: she is compliant with HCTZ 25 mg and verapamil 180 mg daily. Since last OV she denies HA, CP, SOB and swelling. She takes meds at night. She often gets up to pee at night.   2. Rash: around belly button, under pannus and on feet. For many weeks. Scaly rash. Non-pruritic. She tried nizoral cream without improvement. She has also recently developed itchy rash on back of hands that comes and goes improved with benadryl. She works for [employer] she handles raw green tea, orange pekoe and caffeine. She has known Crohn disease and rheumatoid arthritis.   3. Insomnia: improved with nightly trazodone and ambien on Thursday night.   Social History  Substance Use Topics  . Smoking status: Never Smoker   . Smokeless tobacco: Never Used  . Alcohol Use: No    Outpatient Prescriptions Prior to Visit  Medication Sig Dispense Refill  . acetaminophen-codeine (TYLENOL #3) 300-30 MG tablet Take 1-2 tablets by mouth every 8 (eight) hours as needed for severe pain (headache). 60 tablet 0  . Adalimumab (HUMIRA) 40 MG/0.8ML PSKT Inject 40 mg into the skin every 14 (fourteen) days. On Ondays    . cyclobenzaprine (FLEXERIL) 10 MG tablet Take 1 tablet (10 mg total) by mouth 3 (three) times daily as needed (neck and upper back pain). 60 tablet 2  . gabapentin (NEURONTIN) 300 MG capsule Take 1 capsule (300 mg total) by mouth at bedtime. 30 capsule 2  . hydrochlorothiazide (HYDRODIURIL) 25 MG tablet Take 1 tablet (25 mg total) by mouth daily. 30 tablet 5  . ketoconazole (NIZORAL) 2 % cream Apply 1 application topically daily. 15 g 0  . ondansetron (ZOFRAN-ODT) 8 MG disintegrating tablet Take 1 tablet (8 mg total) by mouth every 8 (eight) hours as needed for nausea or vomiting. 20 tablet 0  . traZODone (DESYREL) 50 MG tablet Take 0.5-1 tablets  (25-50 mg total) by mouth at bedtime as needed for sleep. 30 tablet 3  . verapamil (VERELAN PM) 180 MG 24 hr capsule Take 1 capsule (180 mg total) by mouth at bedtime. 30 capsule 5  . zolpidem (AMBIEN) 5 MG tablet Take 1 tablet (5 mg total) by mouth at bedtime as needed for sleep. 15 tablet 0   No facility-administered medications prior to visit.    ROS Review of Systems  Constitutional: Negative for fever, chills and appetite change.  Eyes: Negative for visual disturbance.  Respiratory: Negative for shortness of breath.   Cardiovascular: Negative for chest pain.  Gastrointestinal: Negative for abdominal pain and blood in stool.  Musculoskeletal: Negative for back pain and arthralgias.  Skin: Positive for rash.  Allergic/Immunologic: Negative for immunocompromised state.  Neurological: Negative for headaches.  Hematological: Negative for adenopathy. Does not bruise/bleed easily.  Psychiatric/Behavioral: Negative for suicidal ideas, sleep disturbance and dysphoric mood.    Objective:  BP 143/86 mmHg  Pulse 84  Temp(Src) 98.3 F (36.8 C) (Oral)  Resp 16  Ht 5' 3"  (1.6 m)  Wt 214 lb (97.07 kg)  BMI 37.92 kg/m2  SpO2 100%  BP/Weight 04/02/2015 03/05/2015 35/70/1779  Systolic BP 390 300 923  Diastolic BP 86 82 94  Wt. (Lbs) 214 218 -  BMI 37.92 38.63 -   Wt Readings from Last 3 Encounters:  04/02/15 214 lb (97.07 kg)  03/05/15 218 lb (98.884 kg)  12/15/14 219 lb (99.338 kg)    Physical Exam  Constitutional: She is oriented to person, place, and time. She appears well-developed and well-nourished. No distress.  HENT:  Head: Normocephalic and atraumatic.  Cardiovascular: Normal rate, regular rhythm, normal heart sounds and intact distal pulses.   Pulmonary/Chest: Effort normal and breath sounds normal.  Musculoskeletal: She exhibits no edema.  Neurological: She is alert and oriented to person, place, and time.  Skin: Skin is warm and dry. Rash noted.     Psychiatric: She  has a normal mood and affect.   Lab Results  Component Value Date   HGBA1C 4.90 03/05/2015    Depression screen [facility] 2/9 03/05/2015 01/15/2015 12/15/2014  Decreased Interest 0 0 0  Down, Depressed, Hopeless 0 0 0  PHQ - 2 Score 0 0 0  Altered sleeping 3 - -  Tired, decreased energy 3 - -  Change in appetite 3 - -  Feeling bad or failure about yourself  0 - -  Trouble concentrating 0 - -  Moving slowly or fidgety/restless 0 - -  Suicidal thoughts 0 - -  PHQ-9 Score 9 - -   GAD 7 : Generalized Anxiety Score 03/05/2015  Nervous, Anxious, on Edge 0  Control/stop worrying 0  Worry too much - different things 0  Trouble relaxing 3  Restless 0  Easily annoyed or irritable 0  Afraid - awful might happen 0  Total GAD 7 Score 3     Assessment & Plan:  Fey was seen today for hypertension and rash.  Diagnoses and all orders for this visit:  Scaly patch rash -     Ambulatory referral to Dermatology  Macular erythematous rash -     Ambulatory referral to Dermatology  Intertrigo -     fluconazole (DIFLUCAN) 150 MG tablet; Take 1 tablet (150 mg total) by mouth once a week.  Essential hypertension    There are no diagnoses linked to this encounter. No orders of the defined types were placed in this encounter.    Follow-up: No Follow-up on file.   Boykin Nearing MD

## 2015-04-02 NOTE — Assessment & Plan Note (Signed)
A; scaly rash on stomach appears psoriatic P: Derm referral

## 2015-04-03 ENCOUNTER — Encounter: Payer: Self-pay | Admitting: Family Medicine

## 2015-04-04 ENCOUNTER — Ambulatory Visit: Payer: 59 | Admitting: Cardiology

## 2015-06-08 ENCOUNTER — Encounter: Payer: Self-pay | Admitting: Family Medicine

## 2015-06-08 ENCOUNTER — Ambulatory Visit: Payer: 59 | Attending: Family Medicine | Admitting: Family Medicine

## 2015-06-08 VITALS — BP 119/83 | HR 93 | Temp 98.5°F | Resp 16 | Ht 63.0 in | Wt 212.0 lb

## 2015-06-08 DIAGNOSIS — K50119 Crohn's disease of large intestine with unspecified complications: Secondary | ICD-10-CM | POA: Insufficient documentation

## 2015-06-08 DIAGNOSIS — Z79899 Other long term (current) drug therapy: Secondary | ICD-10-CM | POA: Diagnosis not present

## 2015-06-08 DIAGNOSIS — M79602 Pain in left arm: Secondary | ICD-10-CM | POA: Insufficient documentation

## 2015-06-08 DIAGNOSIS — M06062 Rheumatoid arthritis without rheumatoid factor, left knee: Secondary | ICD-10-CM

## 2015-06-08 DIAGNOSIS — R519 Headache, unspecified: Secondary | ICD-10-CM

## 2015-06-08 DIAGNOSIS — M06061 Rheumatoid arthritis without rheumatoid factor, right knee: Secondary | ICD-10-CM

## 2015-06-08 DIAGNOSIS — M62838 Other muscle spasm: Secondary | ICD-10-CM | POA: Insufficient documentation

## 2015-06-08 DIAGNOSIS — M792 Neuralgia and neuritis, unspecified: Secondary | ICD-10-CM

## 2015-06-08 DIAGNOSIS — G4700 Insomnia, unspecified: Secondary | ICD-10-CM

## 2015-06-08 DIAGNOSIS — M6248 Contracture of muscle, other site: Secondary | ICD-10-CM

## 2015-06-08 DIAGNOSIS — I10 Essential (primary) hypertension: Secondary | ICD-10-CM | POA: Diagnosis not present

## 2015-06-08 DIAGNOSIS — R51 Headache: Secondary | ICD-10-CM | POA: Diagnosis not present

## 2015-06-08 DIAGNOSIS — M79605 Pain in left leg: Secondary | ICD-10-CM | POA: Insufficient documentation

## 2015-06-08 LAB — POCT URINALYSIS DIPSTICK
Blood, UA: NEGATIVE
Glucose, UA: NEGATIVE
Leukocytes, UA: NEGATIVE
Nitrite, UA: NEGATIVE
Protein, UA: 30
Spec Grav, UA: 1.015
Urobilinogen, UA: >=8
pH, UA: 8

## 2015-06-08 LAB — COMPLETE METABOLIC PANEL WITH GFR
ALT: 8 U/L (ref 6–29)
AST: 14 U/L (ref 10–35)
Albumin: 4.1 g/dL (ref 3.6–5.1)
Alkaline Phosphatase: 94 U/L (ref 33–115)
BUN: 11 mg/dL (ref 7–25)
CO2: 32 mmol/L — ABNORMAL HIGH (ref 20–31)
Calcium: 9.4 mg/dL (ref 8.6–10.2)
Chloride: 101 mmol/L (ref 98–110)
Creat: 0.9 mg/dL (ref 0.50–1.10)
GFR, Est African American: 88 mL/min (ref >=60)
GFR, Est Non African American: 76 mL/min (ref >=60)
Glucose, Bld: 83 mg/dL (ref 65–99)
Potassium: 4.5 mmol/L (ref 3.5–5.3)
Sodium: 139 mmol/L (ref 135–146)
Total Bilirubin: 0.3 mg/dL (ref 0.2–1.2)
Total Protein: 7.2 g/dL (ref 6.1–8.1)

## 2015-06-08 LAB — CBC
HCT: 35.9 % (ref 35.0–45.0)
Hemoglobin: 11.8 g/dL (ref 11.7–15.5)
MCH: 28.2 pg (ref 27.0–33.0)
MCHC: 32.9 g/dL (ref 32.0–36.0)
MCV: 85.7 fL (ref 80.0–100.0)
MPV: 8.5 fL (ref 7.5–12.5)
Platelets: 286 10*3/uL (ref 140–400)
RBC: 4.19 MIL/uL (ref 3.80–5.10)
RDW: 13.9 % (ref 11.0–15.0)
WBC: 4.4 10*3/uL (ref 3.8–10.8)

## 2015-06-08 LAB — RHEUMATOID FACTOR: Rhuematoid fact SerPl-aCnc: <10 IU/mL (ref <=14)

## 2015-06-08 LAB — C-REACTIVE PROTEIN: CRP: 2.5 mg/dL — ABNORMAL HIGH (ref <0.60)

## 2015-06-08 MED ORDER — HYDROCHLOROTHIAZIDE 25 MG PO TABS: 25.0000 mg | ORAL_TABLET | Freq: Every day | ORAL | Status: DC

## 2015-06-08 MED ORDER — ACETAMINOPHEN-CODEINE #3 300-30 MG PO TABS: 1.0000 | ORAL_TABLET | Freq: Three times a day (TID) | ORAL | Status: DC | PRN

## 2015-06-08 MED ORDER — GABAPENTIN 300 MG PO CAPS: 300.0000 mg | ORAL_CAPSULE | Freq: Every day | ORAL | Status: DC

## 2015-06-08 MED ORDER — TRAZODONE HCL 50 MG PO TABS: 25.0000 mg | ORAL_TABLET | Freq: Every evening | ORAL | Status: DC | PRN

## 2015-06-08 MED ORDER — PREDNISONE 20 MG PO TABS: 20.0000 mg | ORAL_TABLET | Freq: Every day | ORAL | Status: DC

## 2015-06-08 MED ORDER — CYCLOBENZAPRINE HCL 10 MG PO TABS: 10.0000 mg | ORAL_TABLET | Freq: Three times a day (TID) | ORAL | Status: DC | PRN

## 2015-06-08 MED ORDER — VERAPAMIL HCL ER 180 MG PO CP24: 180.0000 mg | ORAL_CAPSULE | Freq: Every day | ORAL | Status: DC

## 2015-06-08 MED ORDER — ZOLPIDEM TARTRATE 5 MG PO TABS: 5.0000 mg | ORAL_TABLET | Freq: Every evening | ORAL | Status: DC | PRN

## 2015-06-08 NOTE — Progress Notes (Signed)
F/U HTN Pain scale # 6 HA, Lt leg pain  No tobacco user  No suicidal thoughts in the past two weeks

## 2015-06-08 NOTE — Patient Instructions (Addendum)
Andrea Wilkins was seen today for hypertension.  Diagnoses and all orders for this visit:  Insomnia -     zolpidem (AMBIEN) 5 MG tablet; Take 1 tablet (5 mg total) by mouth at bedtime as needed for sleep.  Occipital headache -     acetaminophen-codeine (TYLENOL #3) 300-30 MG tablet; Take 1-2 tablets by mouth every 8 (eight) hours as needed for severe pain (headache).  Left leg pain -     acetaminophen-codeine (TYLENOL #3) 300-30 MG tablet; Take 1-2 tablets by mouth every 8 (eight) hours as needed for severe pain (headache). -     predniSONE (DELTASONE) 20 MG tablet; Take 1 tablet (20 mg total) by mouth daily with breakfast. For 5 days -     CBC -     COMPLETE METABOLIC PANEL WITH GFR -     Sedimentation Rate -     Rheumatoid factor -     C-reactive protein -     POCT urinalysis dipstick  Essential hypertension -     verapamil (VERELAN PM) 180 MG 24 hr capsule; Take 1 capsule (180 mg total) by mouth at bedtime. -     hydrochlorothiazide (HYDRODIURIL) 25 MG tablet; Take 1 tablet (25 mg total) by mouth daily.  Crohn's disease of large intestine with complication ([place]) -     Ambulatory referral to Gastroenterology -     CBC -     COMPLETE METABOLIC PANEL WITH GFR -     Sedimentation Rate -     C-reactive protein   F/u in 4 weeks for joints pains/ muscles aches   Dr. Adrian Blackwater  Crohn Disease Crohn disease is a long-lasting (chronic) disease that affects your gastrointestinal (GI) tract. It often causes irritation and swelling (inflammation) in your small intestine and the beginning of your large intestine. However, it can affect any part of your GI tract. Crohn disease is part of a group of illnesses that are known as inflammatory bowel disease (IBD). Crohn disease may start slowly and get worse over time. Symptoms may come and go. They may also disappear for months or even years at a time (remission). CAUSES The exact cause of Crohn disease is not known. It may be a response that causes your  body's defense system (immune system) to mistakenly attack healthy cells and tissues (autoimmune response). Your genes and your environment may also play a role. RISK FACTORS You may be at greater risk for Crohn disease if you:  Have other family members with Crohn disease or another IBD.  Use any tobacco products, including cigarettes, chewing tobacco, or electronic cigarettes.  Are in your 73s.  Have [place] European ancestry. SIGNS AND SYMPTOMS The main signs and symptoms of Crohn disease involve your GI tract. These include:  Diarrhea.  Rectal bleeding.  An urgent need to move your bowels.  The feeling that you are not finished having a bowel movement.  Abdominal pain or cramping.  Constipation. General signs and symptoms of Crohn disease may also include:  Unexplained weight loss.  Fatigue.  Fever.  Nausea.  Loss of appetite.  Joint pain  Changes in vision.  Red bumps on your skin. DIAGNOSIS Your health care provider may suspect Crohn disease based on your symptoms and your medical history. Your health care provider will do a physical exam. You may need to see a health care provider who specializes in diseases of the digestive tract (gastroenterologist). You may also have tests to help your health care providers make a diagnosis. These may  include:  Blood tests.  Stool sample tests.  Imaging tests, such as X-rays and CT scans.  Tests to examine the inside of your intestines using a long, flexible tube that has a light and a camera on the end (endoscopy or colonoscopy).  A procedure to take tissue samples from inside your bowel (biopsy) to be examined under a microscope. TREATMENT  There is no cure for Crohn disease. Treatment will focus on managing your symptoms. Crohn disease affects each person differently. Your treatment may include:  Resting your bowels. Drinking only clear liquids or getting nutrition through an IV for a period of time gives your  bowels a chance to heal because they are not passing stools.  Medicines. These may be used alone or in combination (combination therapy). These may include antibiotic medicines. You may be given medicines that help to:  Reduce inflammation.  Control your immune system activity.  Fight infections.  Relieve cramps and prevent diarrhea.  Control your pain.  Surgery. You may need surgery if:  Medicines and other treatments are no longer working.  You develop complications from severe Crohn disease.  A section of your intestine becomes so damaged that it needs to be removed. HOME CARE INSTRUCTIONS  Take medicines only as directed by your health care provider.  If you were prescribed an antibiotic medicine, finish it all even if you start to feel better.  Keep all follow-up visits as directed by your health care provider. This is important.  Talk with your health care provider about changing your diet. This may help your symptoms. Your health care provide may recommend changes, such as:  Drinking more fluids.  Avoiding milk and other foods that contain lactose.  Eating a low-fat diet.  Avoiding high-fiber foods, such as popcorn and nuts.  Avoiding carbonated beverages, such as soda.  Eating smaller meals more often rather than eating large meals.  Keeping a food diary to identify foods that make your symptoms better or worse.  Do not use any tobacco products, including cigarettes, chewing tobacco, or electronic cigarettes. If you need help quitting, ask your health care provider.  Limit alcohol intake to no more than 1 drink per day for nonpregnant women and 2 drinks per day for men. One drink equals 12 ounces of beer, 5 ounces of wine, or 1 ounces of hard liquor.  Exercise daily or as directed by your health care provider. SEEK MEDICAL CARE IF:  You have diarrhea, abdominal cramps, and other gastrointestinal problems that are present almost all of the time.  Your  symptoms do not improve with treatment.  You continue to lose weight.  You develop a rash or sores on your skin.  You develop eye problems.  You have a fever.   Your symptoms get worse.  You develop new symptoms. SEEK IMMEDIATE MEDICAL CARE IF:  You have bloody diarrhea.  You develop severe abdominal pain.  You cannot pass stools.   This information is not intended to replace advice given to you by your health care provider. Make sure you discuss any questions you have with your health care provider.   Document Released: 10/23/2004 Document Revised: 02/03/2014 Document Reviewed: 08/31/2013 Elsevier Interactive Patient Education [employer].

## 2015-06-08 NOTE — Progress Notes (Signed)
Subjective:  Patient ID: Andrea Wilkins, female    DOB: 25-Nov-1965  Age: 50 y.o. MRN: [id number]  CC: Hypertension   HPI Andrea Wilkins has hx of Crohn's disease, suspected but not definite RA, HTN, chronic occipital HA, presents for   1. CHRONIC HYPERTENSION  Disease Monitoring  Blood pressure range: not checking   Chest pain: no   Dyspnea: no   Claudication: no   Medication compliance: yes verapamil 180 mg  and HCTZ 25 mg daily  Medication Side Effects  Lightheadedness: no   Urinary frequency: no   Edema: yes comes in goes in L leg   2. Joint pain and Swelling: intermittent swelling in 3rd finger of L hand, L leg. With numbness in hands, arms, feet. No rash other than fungal rash on abdomen that has been evaluated by derm and is improving with treatment. She has pain in both knees with stiffness that worsens when she sits.  No hematuria, oral lesions, eye lesions, vision changes. Denies GI upset and blood in stool. She is currently treated by a rheumatologist at [facility], Dr. 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 She was last seen in 08/2014.  She was previously on Humira  and a prednisone taper without much relief in her joint pains.  She has never seen a GI doctor.   Social History  Substance Use Topics  . Smoking status: Never Smoker   . Smokeless tobacco: Never Used  . Alcohol Use: No   Outpatient Prescriptions Prior to Visit  Medication Sig Dispense Refill  . acetaminophen-codeine (TYLENOL #3) 300-30 MG tablet Take 1-2 tablets by mouth every 8 (eight) hours as needed for severe pain (headache). 60 tablet 0  . cyclobenzaprine (FLEXERIL) 10 MG tablet Take 1 tablet (10 mg total) by mouth 3 (three) times daily as needed (neck and upper back pain). 60 tablet 2  . gabapentin (NEURONTIN) 300 MG capsule Take 1 capsule (300 mg total) by mouth at bedtime. 30 capsule 2  . hydrochlorothiazide (HYDRODIURIL) 25 MG tablet Take 1 tablet (25 mg total) by  mouth daily. 30 tablet 5  . ondansetron (ZOFRAN-ODT) 8 MG disintegrating tablet Take 1 tablet (8 mg total) by mouth every 8 (eight) hours as needed for nausea or vomiting. 20 tablet 0  . traZODone (DESYREL) 50 MG tablet Take 0.5-1 tablets (25-50 mg total) by mouth at bedtime as needed for sleep. 30 tablet 3  . verapamil (VERELAN PM) 180 MG 24 hr capsule Take 1 capsule (180 mg total) by mouth at bedtime. 30 capsule 5  . zolpidem (AMBIEN) 5 MG tablet Take 1 tablet (5 mg total) by mouth at bedtime as needed for sleep. 15 tablet 0  . fluconazole (DIFLUCAN) 150 MG tablet Take 1 tablet (150 mg total) by mouth once a week. 4 tablet 2  . ketoconazole (NIZORAL) 2 % cream Apply 1 application topically daily. 15 g 0   No facility-administered medications prior to visit.    ROS Review of Systems  Constitutional: Positive for fatigue. Negative for fever and chills.  Eyes: Negative for visual disturbance.  Respiratory: Negative for shortness of breath.   Cardiovascular: Positive for leg swelling (L leg after prolonged standing ). Negative for chest pain.  Gastrointestinal: Negative for abdominal pain and blood in stool.  Musculoskeletal: Negative for back pain and arthralgias.       Swelling of middle finger in L hand comes and goes   Skin: Negative for rash.  Allergic/Immunologic: Negative for immunocompromised  state.  Neurological: Positive for numbness (in arms and hands ) and headaches.  Hematological: Negative for adenopathy. Does not bruise/bleed easily.  Psychiatric/Behavioral: Positive for sleep disturbance (without ambien, getting only 4-5 hrs of sleep at night ). Negative for suicidal ideas and dysphoric mood.    Objective:  BP 119/83 mmHg  Pulse 93  Temp(Src) 98.5 F (36.9 C) (Oral)  Resp 16  Ht 5' 3"  (1.6 m)  Wt 212 lb (96.163 kg)  BMI 37.56 kg/m2  SpO2 96%  BP/Weight 06/08/2015 08/31/4625 0/03/5007  Systolic BP 381 829 937  Diastolic BP 83 86 82  Wt. (Lbs) 212 214 218  BMI 37.56  37.92 38.63    Physical Exam  Constitutional: She is oriented to person, place, and time. She appears well-developed and well-nourished. No distress.  obese  HENT:  Head: Normocephalic and atraumatic.  Cardiovascular: Normal rate, regular rhythm, normal heart sounds and intact distal pulses.   Pulmonary/Chest: Effort normal and breath sounds normal.  Musculoskeletal: She exhibits no edema.  Neurological: She is alert and oriented to person, place, and time.  Skin: Skin is warm and dry. No rash noted.  Psychiatric: She has a normal mood and affect.   Lab Results  Component Value Date   HGBA1C 4.90 03/05/2015   Assessment & Plan:   There are no diagnoses linked to this encounter. Andrea Wilkins was seen today for hypertension.  Diagnoses and all orders for this visit:  Insomnia -     zolpidem (AMBIEN) 5 MG tablet; Take 1 tablet (5 mg total) by mouth at bedtime as needed for sleep. -     traZODone (DESYREL) 50 MG tablet; Take 0.5-1 tablets (25-50 mg total) by mouth at bedtime as needed for sleep.  Occipital headache -     acetaminophen-codeine (TYLENOL #3) 300-30 MG tablet; Take 1-2 tablets by mouth every 8 (eight) hours as needed for severe pain (headache). -     cyclobenzaprine (FLEXERIL) 10 MG tablet; Take 1 tablet (10 mg total) by mouth 3 (three) times daily as needed (neck and upper back pain).  Left leg pain -     acetaminophen-codeine (TYLENOL #3) 300-30 MG tablet; Take 1-2 tablets by mouth every 8 (eight) hours as needed for severe pain (headache). -     predniSONE (DELTASONE) 20 MG tablet; Take 1 tablet (20 mg total) by mouth daily with breakfast. For 5 days -     CBC -     COMPLETE METABOLIC PANEL WITH GFR -     Sedimentation Rate -     Rheumatoid factor -     C-reactive protein -     POCT urinalysis dipstick  Essential hypertension -     verapamil (VERELAN PM) 180 MG 24 hr capsule; Take 1 capsule (180 mg total) by mouth at bedtime. -     hydrochlorothiazide (HYDRODIURIL)  25 MG tablet; Take 1 tablet (25 mg total) by mouth daily.  Crohn's disease of large intestine with complication ([place]) -     Ambulatory referral to Gastroenterology -     CBC -     COMPLETE METABOLIC PANEL WITH GFR -     Sedimentation Rate -     C-reactive protein  Radicular pain in left arm -     gabapentin (NEURONTIN) 300 MG capsule; Take 1 capsule (300 mg total) by mouth at bedtime. -     cyclobenzaprine (FLEXERIL) 10 MG tablet; Take 1 tablet (10 mg total) by mouth 3 (three) times daily as needed (neck and  upper back pain).  Muscle spasms of neck -     cyclobenzaprine (FLEXERIL) 10 MG tablet; Take 1 tablet (10 mg total) by mouth 3 (three) times daily as needed (neck and upper back pain).  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 zolpidem (AMBIEN) 5 MG tablet    Sig: Take 1 tablet (5 mg total) by mouth at bedtime as needed for sleep.    Dispense:  30 tablet    Refill:  2  . acetaminophen-codeine (TYLENOL #3) 300-30 MG tablet    Sig: Take 1-2 tablets by mouth every 8 (eight) hours as needed for severe pain (headache).    Dispense:  60 tablet    Refill:  0  . verapamil (VERELAN PM) 180 MG 24 hr capsule    Sig: Take 1 capsule (180 mg total) by mouth at bedtime.    Dispense:  30 capsule    Refill:  5  . hydrochlorothiazide (HYDRODIURIL) 25 MG tablet    Sig: Take 1 tablet (25 mg total) by mouth daily.    Dispense:  30 tablet    Refill:  5  . predniSONE (DELTASONE) 20 MG tablet    Sig: Take 1 tablet (20 mg total) by mouth daily with breakfast. For 5 days    Dispense:  5 tablet    Refill:  0  . traZODone (DESYREL) 50 MG tablet    Sig: Take 0.5-1 tablets (25-50 mg total) by mouth at bedtime as needed for sleep.    Dispense:  30 tablet    Refill:  3  . gabapentin (NEURONTIN) 300 MG capsule    Sig: Take 1 capsule (300 mg total) by mouth at bedtime.    Dispense:  30 capsule    Refill:  2  .  cyclobenzaprine (FLEXERIL) 10 MG tablet    Sig: Take 1 tablet (10 mg total) by mouth 3 (three) times daily as needed (neck and upper back pain).    Dispense:  60 tablet    Refill:  2    Follow-up: No Follow-up on file.   Boykin Nearing MD

## 2015-06-09 LAB — SEDIMENTATION RATE: Sed Rate: 53 mm/hr — ABNORMAL HIGH (ref 0–20)

## 2015-06-11 NOTE — Assessment & Plan Note (Addendum)
Still seronegative with slightly elevated inflammatory markers, Crohn's arthropathy is possible. She is no longer on Humira. No GI symptoms.   Plan:  Course of steroids  Advise f/u with Rhuematology GI referral for Crohn's

## 2015-06-11 NOTE — Assessment & Plan Note (Signed)
BP controlled Med: compliant  Continue current regimen

## 2015-06-15 ENCOUNTER — Encounter: Payer: Self-pay | Admitting: Family Medicine

## 2015-06-20 NOTE — Telephone Encounter (Signed)
Hello Andrea Wilkins, Patient sent a mychart message request GI be with Dr. Collene Mares. Is that possible? Please contact patient if it is or is not.   Thank you,  Dr. Adrian Blackwater

## 2015-09-12 ENCOUNTER — Encounter (HOSPITAL_COMMUNITY): Payer: Self-pay | Admitting: Emergency Medicine

## 2015-09-12 ENCOUNTER — Emergency Department (HOSPITAL_COMMUNITY)
Admission: EM | Admit: 2015-09-12 | Discharge: 2015-09-12 | Disposition: A | Discharge status: Home / Self Care | Payer: 59 | Attending: Emergency Medicine | Admitting: Emergency Medicine

## 2015-09-12 DIAGNOSIS — Z79899 Other long term (current) drug therapy: Secondary | ICD-10-CM | POA: Insufficient documentation

## 2015-09-12 DIAGNOSIS — R51 Headache: Secondary | ICD-10-CM | POA: Insufficient documentation

## 2015-09-12 DIAGNOSIS — J45909 Unspecified asthma, uncomplicated: Secondary | ICD-10-CM | POA: Diagnosis not present

## 2015-09-12 DIAGNOSIS — I10 Essential (primary) hypertension: Secondary | ICD-10-CM | POA: Insufficient documentation

## 2015-09-12 DIAGNOSIS — R519 Headache, unspecified: Secondary | ICD-10-CM

## 2015-09-12 MED ORDER — ACETAMINOPHEN-CODEINE #3 300-30 MG PO TABS: 1.0000 | ORAL_TABLET | Freq: Four times a day (QID) | ORAL | 0 refills | Status: DC | PRN

## 2015-09-12 MED ORDER — METOCLOPRAMIDE HCL 5 MG/ML IJ SOLN
10.0000 mg | Freq: Once | INTRAMUSCULAR | Status: AC
  Administered 2015-09-12: 10 mg via INTRAVENOUS
  Filled 2015-09-12: qty 2

## 2015-09-12 MED ORDER — DIPHENHYDRAMINE HCL 50 MG/ML IJ SOLN
25.0000 mg | Freq: Once | INTRAMUSCULAR | Status: AC
  Administered 2015-09-12: 25 mg via INTRAVENOUS
  Filled 2015-09-12: qty 1

## 2015-09-12 MED ORDER — SODIUM CHLORIDE 0.9 % IV BOLUS (SEPSIS)
1000.0000 mL | Freq: Once | INTRAVENOUS | Status: AC
  Administered 2015-09-12: 1000 mL via INTRAVENOUS

## 2015-09-12 MED ORDER — KETOROLAC TROMETHAMINE 30 MG/ML IJ SOLN
30.0000 mg | Freq: Once | INTRAMUSCULAR | Status: AC
  Administered 2015-09-12: 30 mg via INTRAVENOUS
  Filled 2015-09-12: qty 1

## 2015-09-12 NOTE — ED Triage Notes (Signed)
Pt c/o "headache or migraine" since Monday. Pt c/o N/V, sensitivity to light and sound. Pt also c/o sensitivity to smell. Pt denies blurry vision, double vision. Pt sts pain is all over her head and now starting to progress down her neck. A&Ox4 and ambulatory.

## 2015-09-12 NOTE — ED Provider Notes (Signed)
[facility] DEPT Provider Note   CSN: [id number] Arrival date & time: 09/12/15  1352     History   Chief Complaint Chief Complaint  Patient presents with  . Migraine    HPI Andrea Wilkins is a 50 y.o. female who presents with a headache. PMH significant for frequent HAs and Chiari malformation type I. She was last seen for a headache in November 2016 for similar symptoms. She had a CT and MRI of the head at that time which showed mild white matter changes and mild cerebellar tonsillar ectopia which was consistent with Chiari 1 malformation. A 6 month follow up MRI was recommended. Patient has an appointment with a neurologist coming up. She states she has had a gradually worsening headache for the past 2 days. She has tried to take Ibuprofen and Excedrin without relief. The pain is on the front and is wrapping around to the sides. It feels like "someone is trying to pull my brain out". It is not the most severe headache of her life and is typical for her headaches. She has severe headaches 4x a year. She reports seeing grey spots in her vision, photophobia, phonophobia, N/V. She also reports neck pain. Denies fever, chills, syncope, paresthesias, weakness,   HPI  Past Medical History:  Diagnosis Date  . Arthritis 2015  . Crohn's disease ([place])   . Hypertension Dx 2013    Patient Active Problem List   Diagnosis Date Noted  . Left leg pain 06/08/2015  . Intertrigo 04/02/2015  . Scaly patch rash 04/02/2015  . Macular erythematous rash 04/02/2015  . Tinea corporis 03/05/2015  . Tinea pedis 03/05/2015  . Insomnia 03/05/2015  . Abnormal CT scan, head 12/15/2014  . Chiari malformation type I ([place]) 12/15/2014  . Occipital headache 12/15/2014  . Radicular pain in left arm 12/15/2014  . IDA (iron deficiency anemia) 10/27/2013  . Rheumatoid arthritis ([place]) 10/27/2013  . Crohn disease ([place]) 10/23/2012  . Asthma 10/23/2012  . Tension headache 06/14/2012  . Hypertension 06/14/2012     Past Surgical History:  Procedure Laterality Date  . ABDOMINAL HYSTERECTOMY  2008  . BREAST SURGERY  2007  . CESAREAN SECTION  1988  . SMALL INTESTINE SURGERY  2008    OB History    No data available       Home Medications    Prior to Admission medications   Medication Sig Start Date End Date Taking? Authorizing Provider  hydrochlorothiazide (MICROZIDE) 12.5 MG capsule Take 12.5 mg by mouth daily. 07/27/15  Yes Historical Provider, MD  verapamil (VERELAN PM) 180 MG 24 hr capsule Take 1 capsule (180 mg total) by mouth at bedtime. 06/08/15  Yes Josalyn Funches, MD  acetaminophen-codeine (TYLENOL #3) 300-30 MG tablet Take 1-2 tablets by mouth every 8 (eight) hours as needed for severe pain (headache). Patient not taking: Reported on 09/12/2015 06/08/15   Boykin Nearing, MD  cyclobenzaprine (FLEXERIL) 10 MG tablet Take 1 tablet (10 mg total) by mouth 3 (three) times daily as needed (neck and upper back pain). Patient not taking: Reported on 09/12/2015 06/08/15   Boykin Nearing, MD  gabapentin (NEURONTIN) 300 MG capsule Take 1 capsule (300 mg total) by mouth at bedtime. Patient not taking: Reported on 09/12/2015 06/08/15   Boykin Nearing, MD  hydrochlorothiazide (HYDRODIURIL) 25 MG tablet Take 1 tablet (25 mg total) by mouth daily. Patient not taking: Reported on 09/12/2015 06/08/15   Boykin Nearing, MD  predniSONE (DELTASONE) 20 MG tablet Take 1 tablet (20 mg total) by  mouth daily with breakfast. For 5 days Patient not taking: Reported on 09/12/2015 06/08/15   Boykin Nearing, MD  traZODone (DESYREL) 50 MG tablet Take 0.5-1 tablets (25-50 mg total) by mouth at bedtime as needed for sleep. Patient not taking: Reported on 09/12/2015 06/08/15   Boykin Nearing, MD  zolpidem (AMBIEN) 5 MG tablet Take 1 tablet (5 mg total) by mouth at bedtime as needed for sleep. Patient not taking: Reported on 09/12/2015 06/08/15   Boykin Nearing, MD    Family History Family History  Problem Relation Age of  Onset  . Hypertension Mother   . Diabetes Sister   . Hypertension Sister   . Diabetes Father     Social History Social History  Substance Use Topics  . Smoking status: Never Smoker  . Smokeless tobacco: Never Used  . Alcohol use No     Allergies   Review of patient's allergies indicates no known allergies.   Review of Systems Review of Systems  Constitutional: Negative for fever.  Eyes: Positive for photophobia and visual disturbance.  Musculoskeletal: Positive for neck stiffness.  Neurological: Positive for headaches. Negative for dizziness, tremors, seizures, syncope, facial asymmetry, weakness, light-headedness and numbness.  All other systems reviewed and are negative.    Physical Exam Updated Vital Signs BP 159/89 (BP Location: Left Arm)   Pulse 98   Temp 98.2 F (36.8 C) (Oral)   Resp 12   SpO2 100%   Physical Exam  Constitutional: She is oriented to person, place, and time. She appears well-developed and well-nourished. No distress.  HENT:  Head: Normocephalic and atraumatic.  Eyes: Conjunctivae are normal. Pupils are equal, round, and reactive to light. Right eye exhibits no discharge. Left eye exhibits no discharge. No scleral icterus.  Neck: Normal range of motion. Neck supple.  Tenderness of cervical paraspinal muscles  Cardiovascular: Normal rate and regular rhythm.   Pulmonary/Chest: Effort normal and breath sounds normal. No respiratory distress.  Abdominal: Soft. She exhibits no distension. There is no tenderness.  Musculoskeletal: She exhibits no edema.  Neurological: She is alert and oriented to person, place, and time.  Mental Status:  Alert, oriented, thought content appropriate, able to give a coherent history. Speech fluent without evidence of aphasia. Able to follow 2 step commands without difficulty.  Cranial Nerves:  II:  Peripheral visual fields grossly normal, pupils equal, round, reactive to light III,IV, VI: ptosis not present,  extra-ocular motions intact bilaterally  V,VII: smile symmetric, facial light touch sensation equal VIII: hearing grossly normal to voice  X: uvula elevates symmetrically  XI: bilateral shoulder shrug symmetric and strong XII: midline tongue extension without fassiculations Motor:  Normal tone. 5/5 in upper and lower extremities bilaterally including strong and equal grip strength and dorsiflexion/plantar flexion Sensory: Pinprick and light touch normal in all extremities.  Cerebellar: normal finger-to-nose with bilateral upper extremities Gait: normal gait and balance CV: distal pulses palpable throughout    Skin: Skin is warm and dry.  Psychiatric: She has a normal mood and affect. Her behavior is normal.  Nursing note and vitals reviewed.    ED Treatments / Results  Labs (all labs ordered are listed, but only abnormal results are displayed) Labs Reviewed - No data to display  EKG  EKG Interpretation None       Radiology No results found.  Procedures Procedures (including critical care time)  Medications Ordered in ED Medications  sodium chloride 0.9 % bolus 1,000 mL (1,000 mLs Intravenous New Bag/Given 09/12/15 1454)  ketorolac (  TORADOL) 30 MG/ML injection 30 mg (30 mg Intravenous Given 09/12/15 1454)  diphenhydrAMINE (BENADRYL) injection 25 mg (25 mg Intravenous Given 09/12/15 1454)  metoCLOPramide (REGLAN) injection 10 mg (10 mg Intravenous Given 09/12/15 1454)     Initial Impression / Assessment and Plan / ED Course  I have reviewed the triage vital signs and the nursing notes.  Pertinent labs & imaging results that were available during my care of the patient were reviewed by me and considered in my medical decision making (see chart for details).  Clinical Course   50 year old female presents with a headache. It is a typical HA for her and not the worst. She has an appointment with neurology later this month. Previous imaging showed non-emergent pathology. No  neuro deficits on exam. Migraine cocktail given and patient reports her headache is "easing off". She has a follow up on Sep 13 with neuro. Patient is NAD, non-toxic, with stable VS. Patient is informed of clinical course, understands medical decision making process, and agrees with plan. Opportunity for questions provided and all questions answered. Return precautions given.   Final Clinical Impressions(s) / ED Diagnoses   Final diagnoses:  Nonintractable headache, unspecified chronicity pattern, unspecified headache type    New Prescriptions New Prescriptions   ACETAMINOPHEN-CODEINE (TYLENOL #3) 300-30 MG TABLET    Take 1-2 tablets by mouth every 6 (six) hours as needed for moderate pain.     Recardo Evangelist, PA-C 09/12/15 1551    Carmin Muskrat, MD 09/12/15 [phone]

## 2015-12-01 ENCOUNTER — Other Ambulatory Visit: Payer: Self-pay | Admitting: Family Medicine

## 2015-12-01 DIAGNOSIS — I10 Essential (primary) hypertension: Secondary | ICD-10-CM

## 2015-12-25 ENCOUNTER — Other Ambulatory Visit: Payer: Self-pay | Admitting: Family Medicine

## 2015-12-27 ENCOUNTER — Telehealth: Payer: Self-pay

## 2015-12-27 MED ORDER — ZOLPIDEM TARTRATE 5 MG PO TABS: 5.0000 mg | ORAL_TABLET | Freq: Every evening | ORAL | 5 refills | Status: DC | PRN

## 2015-12-27 NOTE — Telephone Encounter (Signed)
Please inform patient Ambien ready for pick up

## 2015-12-27 NOTE — Addendum Note (Signed)
Addended by: Boykin Nearing on: 12/27/2015 01:55 PM   Modules accepted: Orders

## 2015-12-27 NOTE — Telephone Encounter (Signed)
Writer called patient to let her know that her ambien prescription is ready for pick up.

## 2015-12-27 NOTE — Telephone Encounter (Signed)
Pt called and informed of script being ready for pick up.

## 2015-12-31 ENCOUNTER — Other Ambulatory Visit: Payer: Self-pay | Admitting: Family Medicine

## 2015-12-31 DIAGNOSIS — I10 Essential (primary) hypertension: Secondary | ICD-10-CM

## 2016-01-01 IMAGING — MR MR HEAD WO/W CM
10 of 13 series · 32 of 48 positions shown · IV contrast (multihance)
Comparison: CT head December 02, 2014

CLINICAL DATA: Headache, nausea and vomiting beginning yesterday,
dizziness. Arm numbness. History of migraine headaches, Crohn's
disease, hypertension.

EXAM:
MRI HEAD WITHOUT AND WITH CONTRAST
TECHNIQUE: Multiplanar, multiecho pulse sequences of the brain and surrounding
structures were obtained without and with intravenous contrast.
CONTRAST:  20mL MULTIHANCE GADOBENATE DIMEGLUMINE 529 MG/ML IV SOLN

[Series 3: T1 · sagittal · 5.0mm · 0.47mm/px · 2 of 25 slices shown]
[im 1/25]
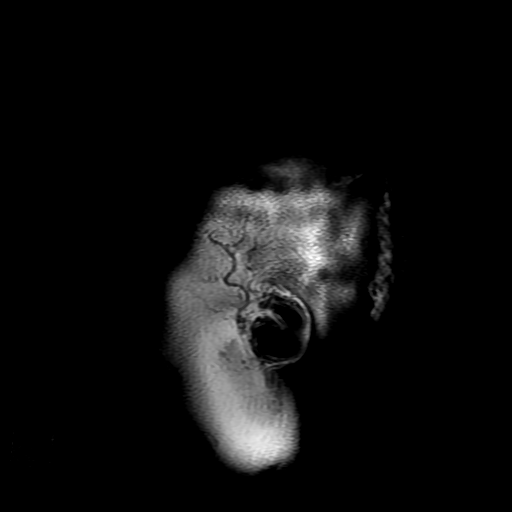
[im 13/25]
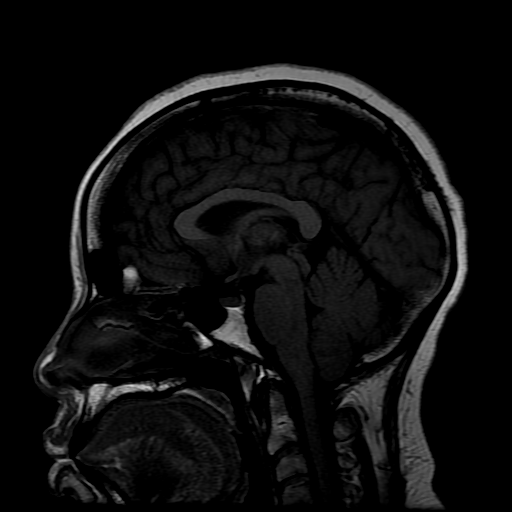

[Series 4: DWI · axial · 3.0mm · 1.09mm/px · z∈[-73,+75]mm · 8 of 102 slices shown (1 of 4)]
[im 1/102]
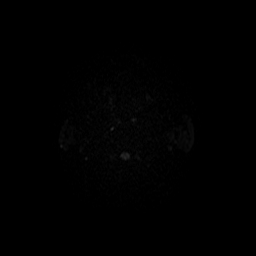
[im 15/102]
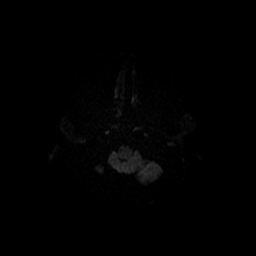
[im 29/102]
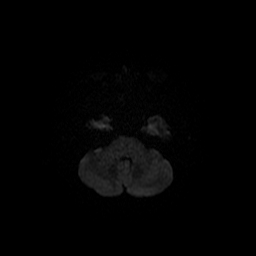
[im 44/102]
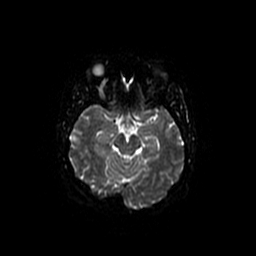
[im 58/102]
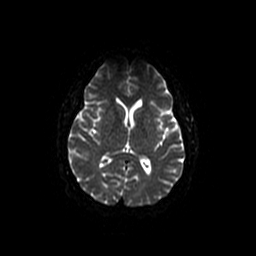
[im 73/102]
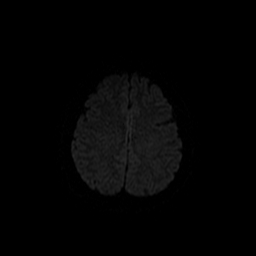
[im 87/102]
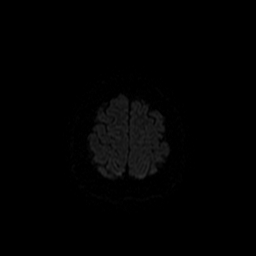
[im 102/102]
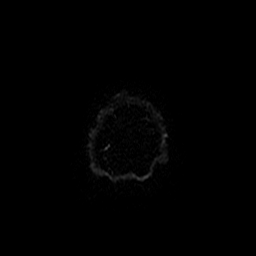

[Series 5: T2 · axial · 5.0mm · 0.43mm/px · z∈[-70,+79]mm · 2 of 26 slices shown]
[im 1/26]
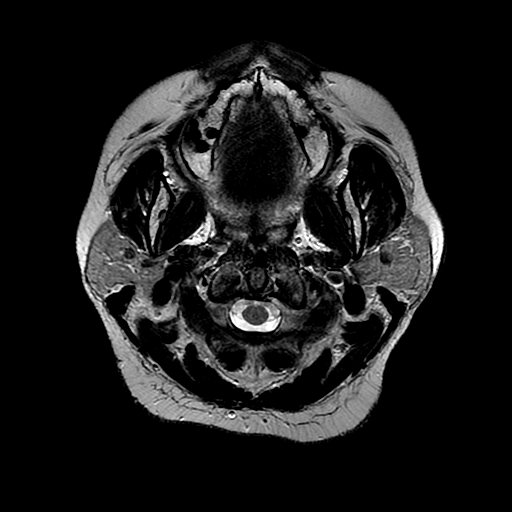
[im 26/26]
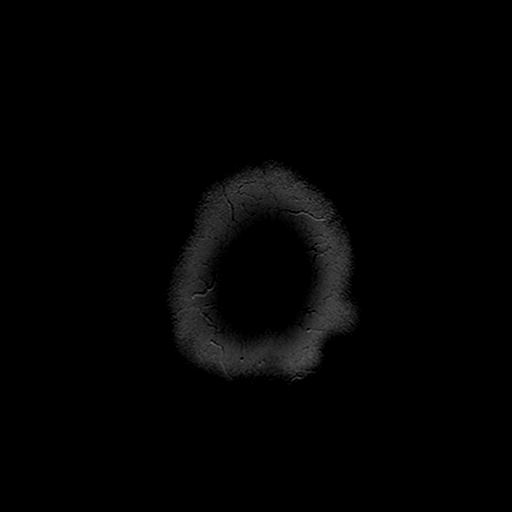

[Series 6: FLAIR · axial · 5.0mm · 0.43mm/px · z∈[-70,+79]mm · 2 of 26 slices shown]
[im 1/26]
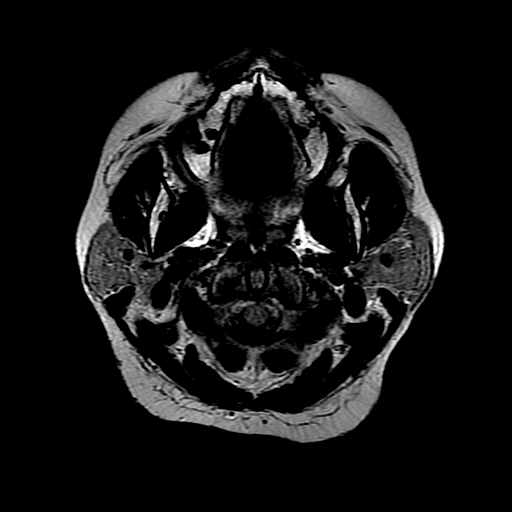
[im 26/26]
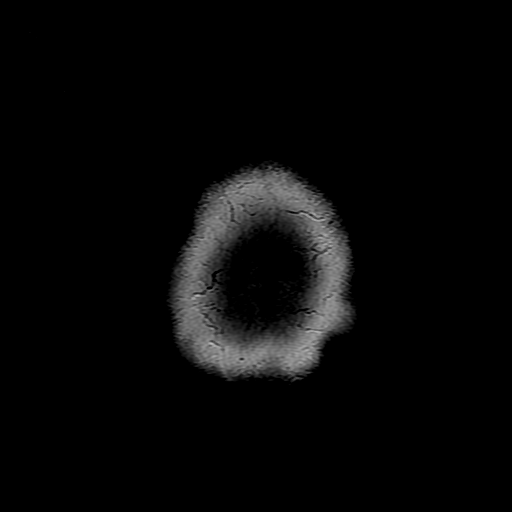

[Series 7: DWI · coronal · 5.0mm · 1.09mm/px · 5 of 66 slices shown (2 of 4)]
[im 1/66]
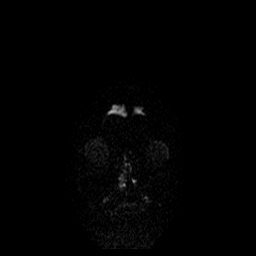
[im 17/66]
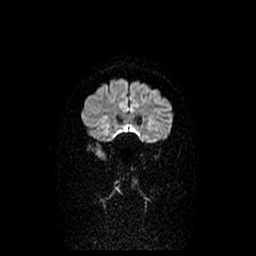
[im 33/66]
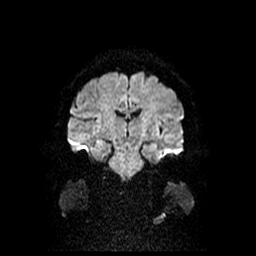
[im 49/66]
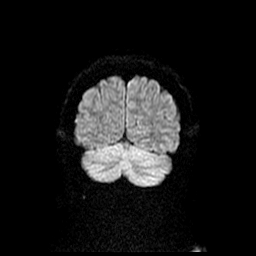
[im 66/66]
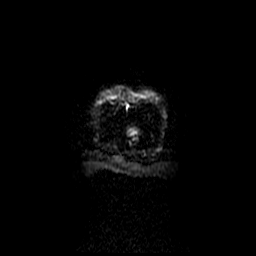

[Series 10: T2 post-contrast · coronal · 5.0mm · 0.39mm/px · 2 of 25 slices shown]
[im 1/25]
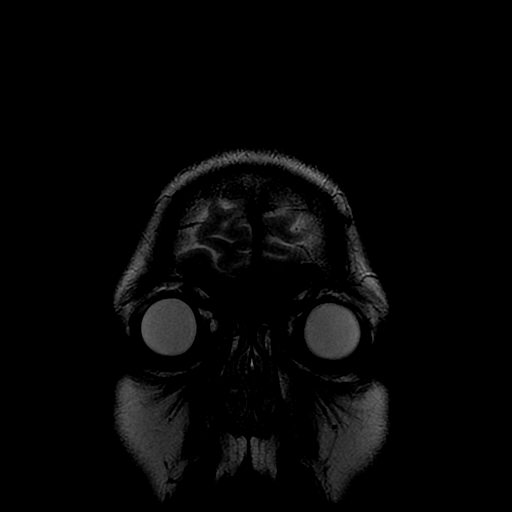
[im 25/25]
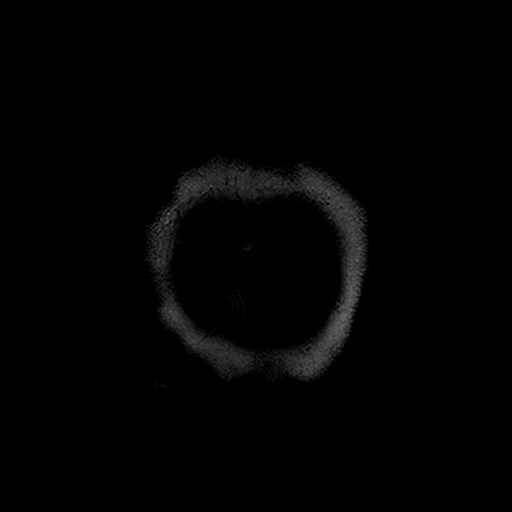

[Series 12: T1 post-contrast · coronal · 5.0mm · 0.39mm/px · 2 of 25 slices shown (1 of 2)]
[im 1/25]
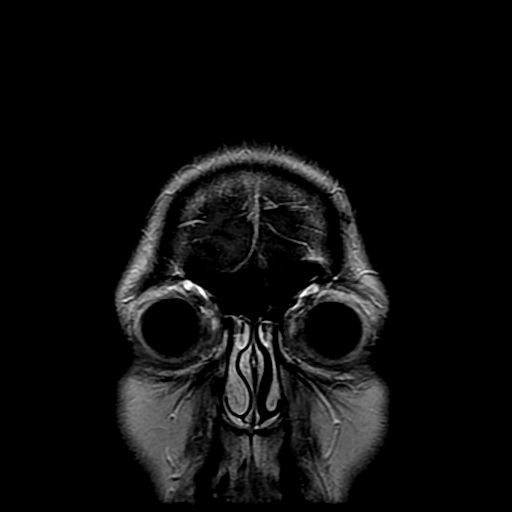
[im 25/25]
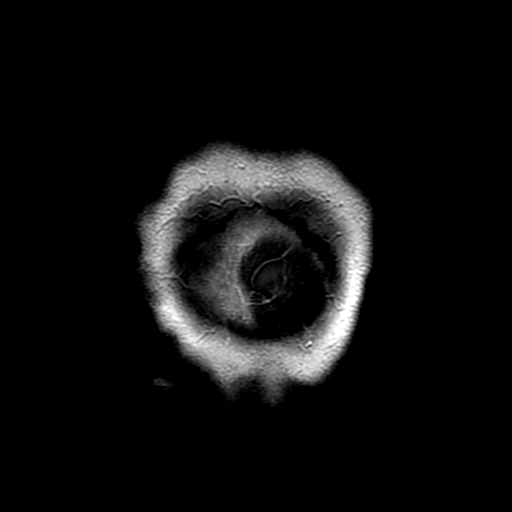

[Series 13: T1 post-contrast · sagittal · 5.0mm · 0.47mm/px · 2 of 25 slices shown (2 of 2)]
[im 1/25]
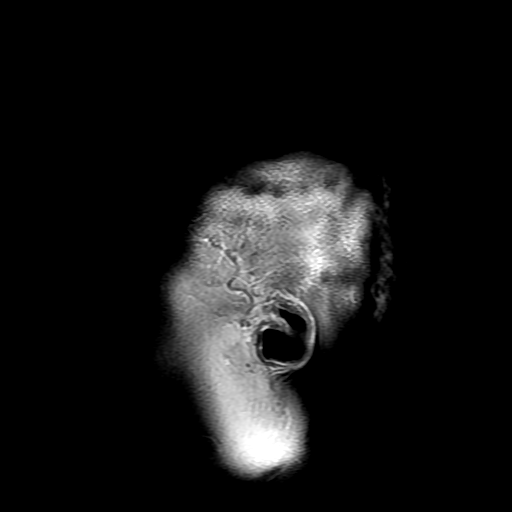
[im 25/25]
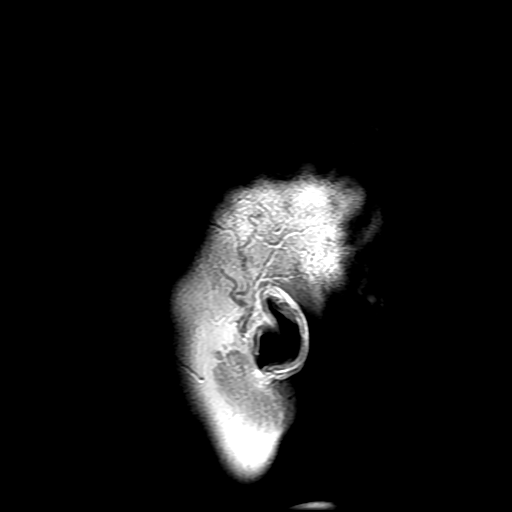

[Series 400: DWI · axial · 3.0mm · 1.09mm/px · z∈[-73,+75]mm · 4 of 51 slices shown (3 of 4)]
[im 1/51]
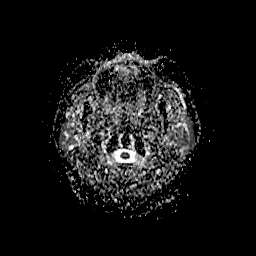
[im 17/51]
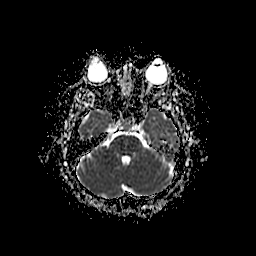
[im 34/51]
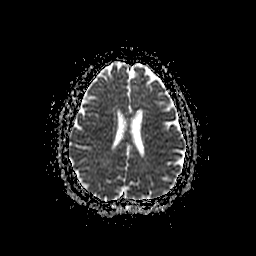
[im 51/51]
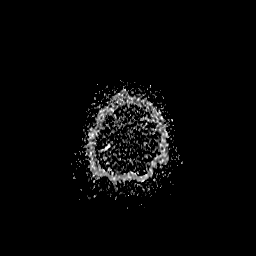

[Series 700: DWI · coronal · 5.0mm · 1.09mm/px · 3 of 33 slices shown (4 of 4)]
[im 1/33]
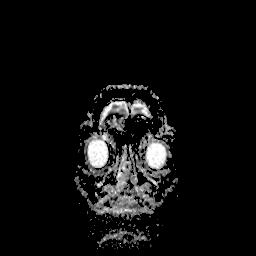
[im 17/33]
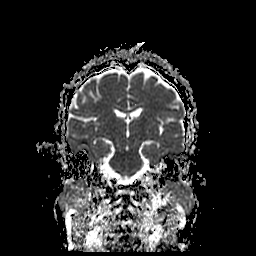
[im 33/33]
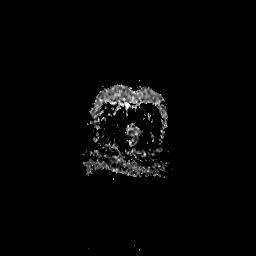

[32 of 48 positions shown; findings below may reference images not displayed]

FINDINGS: The ventricles and sulci are normal for patient's age. No mass
lesions, mass effect. At least 3 subcentimeter white matter FLAIR T2
hyperintensities, 2 of which appear to radiate from the
periventricular white matter. No abnormal parenchymal enhancement.
No reduced diffusion to suggest acute ischemia. No susceptibility
artifact to suggest hemorrhage.

No abnormal extra-axial fluid collections. No extra-axial masses nor
leptomeningeal enhancement. Normal major intracranial vascular flow
voids seen at the skull base.

Ocular globes and orbital contents are unremarkable though not
tailored for evaluation. Symmetrically prominent superior ophthalmic
veins can be seen with Valsalva. No suspicious calvarial bone marrow
signal. No abnormal sellar expansion. Cerebellar tonsils descend 7
mm below the foramen magnum with partial cerebral spinal fluid
effacement, though are not pointed in appearance. Pre pontine
cistern is not effaced. Mild maxillary sinus mucosal thickening.
Mastoid air cells are well aerated.
IMPRESSION: No acute intracranial process, specifically no evidence of
hemorrhage or abnormality within RIGHT frontal lobe.

Mild white matter changes could represent chronic small vessel
ischemic disease though, can also be seen with migraine disorder or
demyelination. Recommend follow-up MRI of the brain in 6 months to
verify stability of findings.

Mild cerebellar tonsillar ectopia, equivocal for Chiari 1
malformation.

## 2016-02-15 ENCOUNTER — Other Ambulatory Visit: Payer: Self-pay | Admitting: Family Medicine

## 2016-02-15 DIAGNOSIS — I10 Essential (primary) hypertension: Secondary | ICD-10-CM

## 2016-02-26 ENCOUNTER — Ambulatory Visit: Payer: 59 | Admitting: Family Medicine

## 2016-02-29 ENCOUNTER — Ambulatory Visit: Payer: 59 | Admitting: Family Medicine

## 2016-03-14 ENCOUNTER — Encounter: Payer: Self-pay | Admitting: Family Medicine

## 2016-03-14 ENCOUNTER — Ambulatory Visit: Payer: 59 | Attending: Family Medicine | Admitting: Family Medicine

## 2016-03-14 VITALS — BP 111/70 | HR 77 | Temp 98.2°F | Ht 63.0 in | Wt 226.0 lb

## 2016-03-14 DIAGNOSIS — L539 Erythematous condition, unspecified: Secondary | ICD-10-CM | POA: Insufficient documentation

## 2016-03-14 DIAGNOSIS — Z23 Encounter for immunization: Secondary | ICD-10-CM

## 2016-03-14 DIAGNOSIS — I10 Essential (primary) hypertension: Secondary | ICD-10-CM | POA: Insufficient documentation

## 2016-03-14 DIAGNOSIS — Z6841 Body Mass Index (BMI) 40.0 and over, adult: Secondary | ICD-10-CM | POA: Diagnosis not present

## 2016-03-14 DIAGNOSIS — D508 Other iron deficiency anemias: Secondary | ICD-10-CM | POA: Diagnosis not present

## 2016-03-14 DIAGNOSIS — Z0001 Encounter for general adult medical examination with abnormal findings: Secondary | ICD-10-CM | POA: Diagnosis present

## 2016-03-14 DIAGNOSIS — R21 Rash and other nonspecific skin eruption: Secondary | ICD-10-CM

## 2016-03-14 DIAGNOSIS — Z79899 Other long term (current) drug therapy: Secondary | ICD-10-CM | POA: Diagnosis not present

## 2016-03-14 DIAGNOSIS — L538 Other specified erythematous conditions: Secondary | ICD-10-CM | POA: Diagnosis not present

## 2016-03-14 LAB — COMPLETE METABOLIC PANEL WITH GFR
ALT: 9 U/L (ref 6–29)
AST: 15 U/L (ref 10–35)
Albumin: 4 g/dL (ref 3.6–5.1)
Alkaline Phosphatase: 80 U/L (ref 33–115)
BUN: 11 mg/dL (ref 7–25)
CO2: 27 mmol/L (ref 20–31)
Calcium: 9.3 mg/dL (ref 8.6–10.2)
Chloride: 101 mmol/L (ref 98–110)
Creat: 0.87 mg/dL (ref 0.50–1.10)
GFR, Est African American: >89 mL/min (ref >=60)
GFR, Est Non African American: 79 mL/min (ref >=60)
Glucose, Bld: 83 mg/dL (ref 65–99)
Potassium: 3.8 mmol/L (ref 3.5–5.3)
Sodium: 139 mmol/L (ref 135–146)
Total Bilirubin: 0.5 mg/dL (ref 0.2–1.2)
Total Protein: 7.2 g/dL (ref 6.1–8.1)

## 2016-03-14 LAB — CBC
HCT: 37 % (ref 35.0–45.0)
Hemoglobin: 12 g/dL (ref 11.7–15.5)
MCH: 28.4 pg (ref 27.0–33.0)
MCHC: 32.4 g/dL (ref 32.0–36.0)
MCV: 87.7 fL (ref 80.0–100.0)
MPV: 8.5 fL (ref 7.5–12.5)
Platelets: 222 10*3/uL (ref 140–400)
RBC: 4.22 MIL/uL (ref 3.80–5.10)
RDW: 14.3 % (ref 11.0–15.0)
WBC: 4.1 10*3/uL (ref 3.8–10.8)

## 2016-03-14 LAB — POCT GLYCOSYLATED HEMOGLOBIN (HGB A1C): Hemoglobin A1C: 4.9

## 2016-03-14 MED ORDER — VERAPAMIL HCL ER 180 MG PO CP24: 180.0000 mg | ORAL_CAPSULE | Freq: Every day | ORAL | 3 refills | Status: DC

## 2016-03-14 NOTE — Patient Instructions (Addendum)
Andrea Wilkins was seen today for annual exam.  Diagnoses and all orders for this visit:  Essential hypertension -     COMPLETE METABOLIC PANEL WITH GFR -     verapamil (VERELAN PM) 180 MG 24 hr capsule; Take 1 capsule (180 mg total) by mouth at bedtime.  Other iron deficiency anemia -     CBC  Obesity, morbid, BMI 40.0-49.9 (HCC) -     HgB A1c  Macular erythematous rash -     Ambulatory referral to Dermatology   Increase low impact exercise Increase green leafy vegetables and lean protein, reduce sugar and carb intake for weight loss  Next pap is due in 11/2017  F/u in 3 months for weight check   Dr. Adrian Blackwater

## 2016-03-14 NOTE — Assessment & Plan Note (Signed)
Controlled. Continue current regimen. 

## 2016-03-14 NOTE — Progress Notes (Signed)
SUBJECTIVE:  51 y.o. female for annual routine check up.  Social History  Substance Use Topics  . Smoking status: Never Smoker  . Smokeless tobacco: Never Used  . Alcohol use No   Current Outpatient Prescriptions  Medication Sig Dispense Refill  . baclofen (LIORESAL) 10 MG tablet     . hydrochlorothiazide (HYDRODIURIL) 25 MG tablet TAKE 1 TABLET BY MOUTH DAILY 30 tablet 0  . hydrOXYzine (ATARAX/VISTARIL) 25 MG tablet     . QUDEXY XR 25 MG CS24     . verapamil (VERELAN PM) 180 MG 24 hr capsule TAKE ONE CAPSULE BY MOUTH AT BEDTIME. 30 capsule 0  . zolpidem (AMBIEN) 5 MG tablet Take 1 tablet (5 mg total) by mouth at bedtime as needed. for sleep 30 tablet 5  . acetaminophen-codeine (TYLENOL #3) 300-30 MG tablet Take 1-2 tablets by mouth every 6 (six) hours as needed for moderate pain. (Patient not taking: Reported on 03/14/2016) 15 tablet 0   No current facility-administered medications for this visit.    Allergies: Patient has no known allergies.  No LMP recorded. Patient has had a hysterectomy.  ROS:  Feeling well. No dyspnea or chest pain on exertion. Some constipation.  No abdominal pain, change in bowel habits, black or bloody stools.  No urinary tract symptoms. GYN ROS: normal menses, no abnormal bleeding, pelvic pain or discharge, no breast pain or new or enlarging lumps on self exam. Rare headaches. Rash under R breast, above and inside umbilicus, under pannus (using tacrolimus and hydroxyzine for one year)   OBJECTIVE:  The patient appears well, alert, oriented x 3, in no distress. BP 111/70 (BP Location: Right Arm, Patient Position: Sitting, Cuff Size: Large)   Pulse 77   Temp 98.2 F (36.8 C) (Oral)   Ht 5' 3"  (1.6 m)   Wt 226 lb (102.5 kg)   SpO2 96%   BMI 40.03 kg/m   Wt Readings from Last 3 Encounters:  03/14/16 226 lb (102.5 kg)  06/08/15 212 lb (96.2 kg)  04/02/15 214 lb (97.1 kg)    Obese. ENT normal.  Neck supple. No adenopathy or thyromegaly. PERLA. Lungs are  clear, good air entry, no wheezes, rhonchi or rales. S1 and S2 normal, no murmurs, regular rate and rhythm. Abdomen soft without tenderness, guarding, mass or organomegaly. Extremities show no edema, normal peripheral pulses. Neurological is normal, no focal findings. Purplish atrophic rash under R breast, above and inside umbilicus and under pannus.   BREAST EXAM: breasts appear normal, no suspicious masses, no skin or nipple changes or axillary nodes  PELVIC EXAM: normal external genitalia, vulva, vagina, cervix, uterus and adnexa, examination not indicated  Lab Results  Component Value Date   HGBA1C 4.9 03/14/2016    Andrea Wilkins was seen today for annual exam.  Diagnoses and all orders for this visit:  Essential hypertension -     COMPLETE METABOLIC PANEL WITH GFR -     verapamil (VERELAN PM) 180 MG 24 hr capsule; Take 1 capsule (180 mg total) by mouth at bedtime.  Other iron deficiency anemia -     CBC  Obesity, morbid, BMI 40.0-49.9 (HCC) -     HgB A1c  Macular erythematous rash -     Ambulatory referral to Dermatology  Other orders -     Tdap vaccine greater than or equal to 7yo IM

## 2016-03-14 NOTE — Assessment & Plan Note (Signed)
Using tacrolimus and atarax without improvement Referral back to dermatology

## 2016-05-02 ENCOUNTER — Ambulatory Visit (INDEPENDENT_AMBULATORY_CARE_PROVIDER_SITE_OTHER): Payer: 59

## 2016-05-02 ENCOUNTER — Ambulatory Visit (INDEPENDENT_AMBULATORY_CARE_PROVIDER_SITE_OTHER): Payer: 59 | Admitting: Physician Assistant

## 2016-05-02 ENCOUNTER — Ambulatory Visit (HOSPITAL_COMMUNITY)
Admission: RE | Admit: 2016-05-02 | Discharge: 2016-05-02 | Disposition: A | Discharge status: Home / Self Care | Payer: 59 | Source: Ambulatory Visit | Attending: Physician Assistant | Admitting: Physician Assistant

## 2016-05-02 VITALS — BP 121/82 | HR 89 | Temp 98.8°F | Resp 16 | Ht 63.0 in | Wt 231.4 lb

## 2016-05-02 DIAGNOSIS — M6740 Ganglion, unspecified site: Secondary | ICD-10-CM | POA: Diagnosis not present

## 2016-05-02 DIAGNOSIS — M67442 Ganglion, left hand: Secondary | ICD-10-CM | POA: Diagnosis not present

## 2016-05-02 DIAGNOSIS — R2232 Localized swelling, mass and lump, left upper limb: Secondary | ICD-10-CM

## 2016-05-02 DIAGNOSIS — M79642 Pain in left hand: Secondary | ICD-10-CM | POA: Insufficient documentation

## 2016-05-02 DIAGNOSIS — M659 Synovitis and tenosynovitis, unspecified: Secondary | ICD-10-CM | POA: Diagnosis not present

## 2016-05-02 LAB — POCT I-STAT CREATININE: Creatinine, Ser: 0.8 mg/dL (ref 0.44–1.00)

## 2016-05-02 MED ORDER — MELOXICAM 7.5 MG PO TABS
7.5000 mg | ORAL_TABLET | Freq: Every day | ORAL | 1 refills | Status: DC
Start: 2016-05-02 — End: 2016-06-13

## 2016-05-02 MED ORDER — GADOBENATE DIMEGLUMINE 529 MG/ML IV SOLN
20.0000 mL | Freq: Once | INTRAVENOUS | Status: AC | PRN
  Administered 2016-05-02: 20 mL via INTRAVENOUS

## 2016-05-02 NOTE — Patient Instructions (Addendum)
I will contact you with the results of your imaging for further treatment plan once they are received. Thank you for letting me participate in your health and well being.     IF you received an x-ray today, you will receive an invoice from [facility]. Please contact [facility] at [phone] with questions or concerns regarding your invoice.   IF you received labwork today, you will receive an invoice from [facility]. Please contact [facility] at [phone] with questions or concerns regarding your invoice.   Our billing staff will not be able to assist you with questions regarding bills from these companies.  You will be contacted with the lab results as soon as they are available. The fastest way to get your results is to activate your My Chart account. Instructions are located on the last page of this paperwork. If you have not heard from [place] regarding the results in 2 weeks, please contact this office.

## 2016-05-02 NOTE — Progress Notes (Signed)
Andrea Wilkins  MRN: [id number] DOB: 08/23/1965  Subjective:  Andrea Wilkins is a 51 y.o. female seen in office today for a chief complaint of mass on back of hand x 1 month. Notes it has grown in size since it first appeared. She is now having left hand pain and intermittent numbness up to her elbow. Denies redness, warmth, purulent drainage, decrease ROM, and weakness. Pain is worsened when she is lifting objects, which she does constantly at her job. She has tried ibupforen and ice consistently with no relief.   Review of Systems  Constitutional: Negative for chills, diaphoresis and fever.    Patient Active Problem List   Diagnosis Date Noted  . Obesity, morbid, BMI 40.0-49.9 ([place]) 03/14/2016  . Left leg pain 06/08/2015  . Intertrigo 04/02/2015  . Scaly patch rash 04/02/2015  . Macular erythematous rash 04/02/2015  . Tinea corporis 03/05/2015  . Tinea pedis 03/05/2015  . Insomnia 03/05/2015  . Abnormal CT scan, head 12/15/2014  . Chiari malformation type I ([place]) 12/15/2014  . Occipital headache 12/15/2014  . Radicular pain in left arm 12/15/2014  . IDA (iron deficiency anemia) 10/27/2013  . Rheumatoid arthritis ([place]) 10/27/2013  . Crohn disease ([place]) 10/23/2012  . Asthma 10/23/2012  . Tension headache 06/14/2012  . Hypertension 06/14/2012    Current Outpatient Prescriptions on File Prior to Visit  Medication Sig Dispense Refill  . hydrochlorothiazide (HYDRODIURIL) 25 MG tablet TAKE 1 TABLET BY MOUTH DAILY 30 tablet 0  . QUDEXY XR 25 MG CS24     . verapamil (VERELAN PM) 180 MG 24 hr capsule Take 1 capsule (180 mg total) by mouth at bedtime. 90 capsule 3  . zolpidem (AMBIEN) 5 MG tablet Take 1 tablet (5 mg total) by mouth at bedtime as needed. for sleep 30 tablet 5   No current facility-administered medications on file prior to visit.     Allergies  Allergen Reactions  . Benadryl Allergy  [Diphenhydramine Hcl (Sleep)] Cough      Social History   Social History  .  Marital status: Single    Spouse name: N/A  . Number of children: N/A  . Years of education: N/A   Occupational History  . Not on file.   Social History Main Topics  . Smoking status: Never Smoker  . Smokeless tobacco: Never Used  . Alcohol use No  . Drug use: No  . Sexual activity: No   Other Topics Concern  . Not on file   Social History Narrative  . No narrative on file    Objective:  BP 121/82   Pulse 89   Temp 98.8 F (37.1 C) (Oral)   Resp 16   Ht 5' 3"  (1.6 m)   Wt 231 lb 6.4 oz (105 kg)   SpO2 99%   BMI 40.99 kg/m   Physical Exam  Constitutional: She is oriented to person, place, and time and well-developed, well-nourished, and in no distress.  HENT:  Head: Normocephalic and atraumatic.  Eyes: Conjunctivae are normal.  Neck: Normal range of motion.  Pulmonary/Chest: Effort normal.  Musculoskeletal:       Left wrist: She exhibits decreased range of motion, tenderness (with ROM) and swelling. She exhibits no bony tenderness.       Left forearm: She exhibits tenderness (with palpation of musculature). She exhibits no swelling.       Left hand: She exhibits tenderness (with palpation of mass on left hand), bony tenderness (with deep palpation of carpal bones) and  swelling. She exhibits normal two-point discrimination and normal capillary refill. Normal sensation noted. Decreased strength noted. She exhibits wrist extension trouble.  Neurological: She is alert and oriented to person, place, and time. Gait normal.  Skin: Skin is warm and dry.  Palpable, fixed, soft mass, ~2cm in size, noted on dorsal aspect of left hand. There is no overlying erythema or warmth noted. Tender to palpation.   Psychiatric: Affect normal.  Vitals reviewed.  Dg Hand Complete Left  Result Date: 05/02/2016 CLINICAL DATA:  Hand pain. Palpable mass on the dorsal aspect of the right hand. EXAM: LEFT HAND - COMPLETE 3+ VIEW COMPARISON:  None. FINDINGS: There is no fracture or dislocation.   No appreciable joint effusion. Slight arthritic changes between the scaphoid and trapezium. No other abnormalities. IMPRESSION: Slight arthritis between the scaphoid and trapezium. Otherwise, normal exam. Electronically Signed   By: Lorriane Shire M.D.   On: 05/02/2016 11:03    Assessment and Plan :  1. Hand pain, left Plain films negative in office today, plan for STAT MR of left hand as the mass is growing in size and causing the patient pain and intermittent numbness. Will contact pt once MR is complete and discuss further tx plan.   - DG Hand Complete Left; Future - MR HAND LEFT W WO CONTRAST; Future - Ambulatory referral to Hand Surgery 2. Mass of hand, left 3. Ganglion cyst   Update:  MRI showed-1. Multifocal tenosynovitis ,2. Ganglion cyst adjacent to the dorsal aspect of the fourth dorsal extensor tendon compartment at the level of the bases of the third and fourth metacarpals. I have contacted patient and informed her of results. Considering the cyst is symptomatic, I have placed an urgent referral to hand surgery. I also prescribed meloxicam. Instructed her to go and try on wrist splints and see if this helps with pain; however, I am concerned that it may cause more pain as she is highly tender to palpation of the cyst. If it causes more pain, instructed her not to wear a splint. Otherwise, continue icing and avoiding heavy lifting and repetitive motions with left hand/wrist. I have given her a work note with work restrictions until she is evaluated by hand surgery.   Tenna Delaine PA-C  [facility] 05/02/2016 10:49 AM

## 2016-05-05 ENCOUNTER — Telehealth: Payer: Self-pay | Admitting: Family Medicine

## 2016-05-05 ENCOUNTER — Encounter: Payer: Self-pay | Admitting: Physician Assistant

## 2016-05-05 NOTE — Telephone Encounter (Signed)
Pt job will not accept pt out of work letter states that it needs to be on a letter head with her signature please call pt when letter is ready

## 2016-05-05 NOTE — Telephone Encounter (Signed)
I wrote a note at her last OV and sent it via mychart under communication management. Could you please print it and place our stamp on it. I can sign it if need be but an office stamp or your signature should work. Please let me know if you cannot access the letter.

## 2016-05-05 NOTE — Telephone Encounter (Signed)
Seen 05/02/16 last ov Will you write note?

## 2016-05-06 NOTE — Telephone Encounter (Signed)
Up front for pick up

## 2016-06-11 ENCOUNTER — Encounter: Payer: Self-pay | Admitting: Family Medicine

## 2016-06-13 ENCOUNTER — Encounter: Payer: Self-pay | Admitting: Family Medicine

## 2016-06-13 ENCOUNTER — Ambulatory Visit: Payer: 59 | Attending: Family Medicine | Admitting: Family Medicine

## 2016-06-13 DIAGNOSIS — M65842 Other synovitis and tenosynovitis, left hand: Secondary | ICD-10-CM

## 2016-06-13 DIAGNOSIS — M069 Rheumatoid arthritis, unspecified: Secondary | ICD-10-CM | POA: Diagnosis not present

## 2016-06-13 DIAGNOSIS — Z6841 Body Mass Index (BMI) 40.0 and over, adult: Secondary | ICD-10-CM | POA: Insufficient documentation

## 2016-06-13 DIAGNOSIS — I10 Essential (primary) hypertension: Secondary | ICD-10-CM | POA: Insufficient documentation

## 2016-06-13 DIAGNOSIS — E669 Obesity, unspecified: Secondary | ICD-10-CM | POA: Insufficient documentation

## 2016-06-13 DIAGNOSIS — M6588 Other synovitis and tenosynovitis, other site: Secondary | ICD-10-CM | POA: Diagnosis not present

## 2016-06-13 DIAGNOSIS — M65832 Other synovitis and tenosynovitis, left forearm: Secondary | ICD-10-CM | POA: Insufficient documentation

## 2016-06-13 DIAGNOSIS — M67432 Ganglion, left wrist: Secondary | ICD-10-CM | POA: Diagnosis not present

## 2016-06-13 DIAGNOSIS — K5090 Crohn's disease, unspecified, without complications: Secondary | ICD-10-CM | POA: Diagnosis not present

## 2016-06-13 DIAGNOSIS — M65932 Unspecified synovitis and tenosynovitis, left forearm: Secondary | ICD-10-CM | POA: Insufficient documentation

## 2016-06-13 MED ORDER — NALTREXONE-BUPROPION HCL ER 8-90 MG PO TB12: ORAL_TABLET | ORAL | 0 refills | Status: DC

## 2016-06-13 NOTE — Assessment & Plan Note (Signed)
A: obesity in setting of insomnia, lack of exercise, overeating P: contrave if not covered by insurance will prescribe Wellbutrin Addressed sleep hygiene Addressed exercise program F/u in 4 weeks

## 2016-06-13 NOTE — Progress Notes (Signed)
Subjective:  Patient ID: Andrea Wilkins, female    DOB: 01/06/1966  Age: 51 y.o. MRN: [id number]  CC: Weight Check   HPI Helyn Schwan has hx of Crohn's disease, RA, HTN, chronic occipital HA, presents for    1. Weight check: she is morbidly obese. She has made the following changes: Going to bed: 6:30 PM, she falls asleep by 11:30 to 12 PM. Gets 3 hrs of sleep during work week.  She only takes the Ambien on the weekend.  Wake up: 2:30 AM Eating lunch at 1:30/2 PM Eating last meal by 6 PM  Exercise: at work only. Walking at work. 2 hrs of walking. Her building is about the size of 4 football fields. No exercise on the weekend.  She sees a nutritionist at work. She works for [employer].  She finds herself overeating when not hungry. She has little energy.  She is stressed with planning her wedding on October.   2. Planning hand surgery: she has L extensor tenosynovitis and ganglion cyst that is painful. She is under the care of ortho Dr. Fredna Dow. She has postponed the surgery to 11/24/2016 during her wedding/honeymoon.   Social History  Substance Use Topics  . Smoking status: Never Smoker  . Smokeless tobacco: Never Used  . Alcohol use No   Outpatient Medications Prior to Visit  Medication Sig Dispense Refill  . hydrochlorothiazide (HYDRODIURIL) 25 MG tablet TAKE 1 TABLET BY MOUTH DAILY 30 tablet 0  . meloxicam (MOBIC) 7.5 MG tablet Take 1-2 tablets (7.5-15 mg total) by mouth daily. 30 tablet 1  . QUDEXY XR 25 MG CS24     . verapamil (VERELAN PM) 180 MG 24 hr capsule Take 1 capsule (180 mg total) by mouth at bedtime. 90 capsule 3  . zolpidem (AMBIEN) 5 MG tablet Take 1 tablet (5 mg total) by mouth at bedtime as needed. for sleep 30 tablet 5   No facility-administered medications prior to visit.     ROS Review of Systems  Constitutional: Positive for fatigue. Negative for chills and fever.  Eyes: Negative for visual disturbance.  Respiratory: Negative for shortness of breath.    Cardiovascular: Positive for leg swelling (L leg after prolonged standing ). Negative for chest pain.  Gastrointestinal: Negative for abdominal pain and blood in stool.  Musculoskeletal: Negative for arthralgias and back pain.       Swelling of middle finger in L hand comes and goes   Skin: Negative for rash.  Allergic/Immunologic: Negative for immunocompromised state.  Neurological: Positive for numbness (in arms and hands ) and headaches.  Hematological: Negative for adenopathy. Does not bruise/bleed easily.  Psychiatric/Behavioral: Positive for sleep disturbance. Negative for dysphoric mood and suicidal ideas.    Objective:  BP 133/81   Pulse 79   Temp 98 F (36.7 C) (Oral)   Wt 230 lb 12.8 oz (104.7 kg)   SpO2 96%   BMI 40.88 kg/m   BP/Weight 06/13/2016 05/02/2016 03/08/4707  Systolic BP 628 366 294  Diastolic BP 81 82 70  Wt. (Lbs) 230.8 231.4 226  BMI 40.88 40.99 40.03   Wt Readings from Last 3 Encounters:  06/13/16 230 lb 12.8 oz (104.7 kg)  05/02/16 231 lb 6.4 oz (105 kg)  03/14/16 226 lb (102.5 kg)    Physical Exam  Constitutional: She is oriented to person, place, and time. She appears well-developed and well-nourished. No distress.  obese  HENT:  Head: Normocephalic and atraumatic.  Cardiovascular: Normal rate, regular rhythm, normal heart sounds  and intact distal pulses.   Pulmonary/Chest: Effort normal and breath sounds normal.  Musculoskeletal: She exhibits no edema.  Neurological: She is alert and oriented to person, place, and time.  Skin: Skin is warm and dry. No rash noted.  Psychiatric: She has a normal mood and affect.   Lab Results  Component Value Date   HGBA1C 4.9 03/14/2016   Assessment & Plan:   There are no diagnoses linked to this encounter. Kaidance was seen today for weight check.  Diagnoses and all orders for this visit:  Obesity, morbid, BMI 40.0-49.9 ([facility]) -     Naltrexone-Bupropion HCl ER 8-90 MG TB12; Week one take by mouth 1 tab in  the morning, week two take 1 tab twice daily in morning and evening, week three take 2 tabs in morning and 1 in evening, week four take 2 tabs twice daily  Extensor tenosynovitis of left wrist  Ganglion cyst of wrist, left   No orders of the defined types were placed in this encounter.   Follow-up: Return in about 4 weeks (around 07/11/2016) for weight management .   Boykin Nearing MD

## 2016-06-13 NOTE — Patient Instructions (Addendum)
Andrea Wilkins was seen today for weight check.  Diagnoses and all orders for this visit:  Obesity, morbid, BMI 40.0-49.9 ([facility]) -     Naltrexone-Bupropion HCl ER 8-90 MG TB12; Week one take by mouth 1 tab in the morning, week two take 1 tab twice daily in morning and evening, week three take 2 tabs in morning and 1 in evening, week four take 2 tabs twice daily  Extensor tenosynovitis of left wrist  Ganglion cyst of wrist, left   Work on sleep hygiene: Bedroom is for  sleep and sex only. If you cannot sleep within 20 minutes do the following: Get out of bed and go to dimly lit room Read a book You may drink water or chamomile unsweetened tea-avoid food and any other beverage Avoid the phone and TV  Goals: Upper body exercise routine 3 times per week 30-45 minute walk or gym time after work   Call if contrave not covered and I will prescribe Wellbutrin  F/u in 4 weeks  Dr. Adrian Blackwater

## 2016-06-16 MED ORDER — BUPROPION HCL ER (XL) 150 MG PO TB24: 150.0000 mg | ORAL_TABLET | Freq: Every day | ORAL | 0 refills | Status: DC

## 2016-06-16 NOTE — Addendum Note (Signed)
Addended by: Boykin Nearing on: 06/16/2016 05:03 PM   Modules accepted: Orders

## 2016-06-30 ENCOUNTER — Encounter: Payer: Self-pay | Admitting: Family Medicine

## 2016-06-30 NOTE — Telephone Encounter (Signed)
Patient concern

## 2016-07-01 NOTE — Telephone Encounter (Signed)
Patient response regarding medication.

## 2016-07-16 ENCOUNTER — Other Ambulatory Visit: Payer: Self-pay | Admitting: Family Medicine

## 2016-07-16 DIAGNOSIS — I10 Essential (primary) hypertension: Secondary | ICD-10-CM

## 2016-07-25 ENCOUNTER — Ambulatory Visit: Payer: 59 | Attending: Family Medicine | Admitting: Family Medicine

## 2016-07-25 ENCOUNTER — Encounter: Payer: Self-pay | Admitting: Family Medicine

## 2016-07-25 ENCOUNTER — Encounter: Payer: 59 | Attending: Family Medicine | Admitting: Registered"

## 2016-07-25 DIAGNOSIS — Z79899 Other long term (current) drug therapy: Secondary | ICD-10-CM | POA: Insufficient documentation

## 2016-07-25 DIAGNOSIS — E669 Obesity, unspecified: Secondary | ICD-10-CM

## 2016-07-25 DIAGNOSIS — Z6841 Body Mass Index (BMI) 40.0 and over, adult: Secondary | ICD-10-CM | POA: Insufficient documentation

## 2016-07-25 DIAGNOSIS — Z713 Dietary counseling and surveillance: Secondary | ICD-10-CM | POA: Diagnosis not present

## 2016-07-25 MED ORDER — PHENTERMINE HCL 37.5 MG PO TABS: ORAL_TABLET | ORAL | 0 refills | Status: DC

## 2016-07-25 MED ORDER — TOPIRAMATE 25 MG PO CPSP: ORAL_CAPSULE | ORAL | 0 refills | Status: DC

## 2016-07-25 NOTE — Assessment & Plan Note (Signed)
A: obesity with overeating triggered by anxiety  P: Patient to continue changes to diet, she has reduced sugar intake Continue exercise, she is walking 30 minutes 3 days a week and planning to start Saturday morning exercise with her cousin who is a physical therapist  Start phentermine 37.5 mg daily for next 14 days then increase to 75 mg daily along with Topamax 25 mg daily for next 14 days then increase to 50 mg daily  Continue with plan to see nutritionist to help with diet planing and strategies for weight loss  Close follow up in 3 weeks

## 2016-07-25 NOTE — Patient Instructions (Signed)
Andrea Wilkins was seen today for follow-up.  Diagnoses and all orders for this visit:  Obesity, morbid, BMI 40.0-49.9 ([facility]) -     phentermine (ADIPEX-P) 37.5 MG tablet; Take one tablet daily for 14 days, then two tablets daily -     topiramate (TOPAMAX) 25 MG capsule; Take one tablet by mouth daily for 14 days, then two tablets daily    Keep nutritionist appointment Work on low carb eating- avoid sweets and sugar sweetened drinks  F/u in 3 weeks for medication management/tolerance check  Dr. Adrian Blackwater

## 2016-07-25 NOTE — Progress Notes (Signed)
Subjective:  Patient ID: Andrea Wilkins, female    DOB: 1965/06/11  Age: 51 y.o. MRN: [id number]  CC: Follow-up (weight )   HPI Andrea Wilkins has hx of Crohn's disease, RA, HTN, chronic occipital HA, presents for    1. Weight check: she is morbidly obese. She has made the following changes: Going to bed: 6:30 PM, she falls asleep by 11:30 to 12 PM. Gets 3 hrs of sleep during work week.  She only takes the Ambien on the weekend.  Wake up: 2:30 AM Eating lunch at 1:30/2 PM Eating last meal by 6 PM   Exercise: at work only. Walking at work. 2 hrs of walking. Her building is about the size of 4 football fields. No exercise on the weekend.  She sees a nutritionist at work. She works for [employer].  She finds herself overeating when not hungry. She wakes up at night and eats.  She has little energy.  She is stressed with planning her wedding on October.  She is stressed due to illness in her mother, Andrea Wilkins who is being evaluated for possible bladder cancer.   Pharmacotherapy: Phentermine in 2004-lost 40 lbs for breast reduction. Kept weight down for 10 years. She would like to restart. She reports she tolerated the medication well.  Contrave prescribed- not covered by insurance Wellbutrin prescribed- not started    Social History  Substance Use Topics  . Smoking status: Never Smoker  . Smokeless tobacco: Never Used  . Alcohol use No   Outpatient Medications Prior to Visit  Medication Sig Dispense Refill  . buPROPion (WELLBUTRIN XL) 150 MG 24 hr tablet Take 1 tablet (150 mg total) by mouth daily. For one week then increase to 300 mg daily 60 tablet 0  . hydrochlorothiazide (HYDRODIURIL) 25 MG tablet TAKE 1 TABLET BY MOUTH EVERY DAY 30 tablet 5  . Topiramate ER 25 MG CS24 Take 25 mg by mouth daily.    . verapamil (VERELAN PM) 180 MG 24 hr capsule Take 1 capsule (180 mg total) by mouth at bedtime. 90 capsule 3  . zolpidem (AMBIEN) 5 MG tablet Take 1 tablet (5 mg total) by  mouth at bedtime as needed. for sleep 30 tablet 5   No facility-administered medications prior to visit.     ROS Review of Systems  Constitutional: Positive for fatigue. Negative for chills and fever.  Eyes: Negative for visual disturbance.  Respiratory: Negative for shortness of breath.   Cardiovascular: Positive for leg swelling (L leg after prolonged standing ). Negative for chest pain.  Gastrointestinal: Negative for abdominal pain and blood in stool.  Musculoskeletal: Negative for arthralgias and back pain.       Swelling of middle finger in L hand comes and goes   Skin: Negative for rash.  Allergic/Immunologic: Negative for immunocompromised state.  Neurological: Positive for numbness (in arms and hands ) and headaches.  Hematological: Negative for adenopathy. Does not bruise/bleed easily.  Psychiatric/Behavioral: Positive for sleep disturbance. Negative for dysphoric mood and suicidal ideas.    Objective:  BP 131/83   Pulse 76   Temp 98.3 F (36.8 C) (Oral)   Ht 5' 3"  (1.6 m)   Wt 234 lb 12.8 oz (106.5 kg)   SpO2 95%   BMI 41.59 kg/m  Waist circumference 43.5 inches   BP/Weight 07/25/2016 6/83/4196 02/28/2977  Systolic BP 892 119 417  Diastolic BP 83 81 82  Wt. (Lbs) 234.8 230.8 231.4  BMI 41.59 40.88 40.99   Wt Readings from  Last 3 Encounters:  07/25/16 234 lb 12.8 oz (106.5 kg)  06/13/16 230 lb 12.8 oz (104.7 kg)  05/02/16 231 lb 6.4 oz (105 kg)    Physical Exam  Constitutional: She is oriented to person, place, and time. She appears well-developed and well-nourished. No distress.  obese  HENT:  Head: Normocephalic and atraumatic.  Cardiovascular: Normal rate, regular rhythm, normal heart sounds and intact distal pulses.   Pulmonary/Chest: Effort normal and breath sounds normal.  Musculoskeletal: She exhibits no edema.  Neurological: She is alert and oriented to person, place, and time.  Skin: Skin is warm and dry. No rash noted.  Psychiatric: She has a  normal mood and affect.   Lab Results  Component Value Date   HGBA1C 4.9 03/14/2016   Assessment & Plan:   There are no diagnoses linked to this encounter. Andrea Wilkins was seen today for follow-up.  Diagnoses and all orders for this visit:  Obesity, morbid, BMI 40.0-49.9 ([facility]) -     phentermine (ADIPEX-P) 37.5 MG tablet; Take one tablet daily for 14 days, then two tablets daily -     topiramate (TOPAMAX) 25 MG capsule; Take one tablet by mouth daily for 14 days, then two tablets daily   No orders of the defined types were placed in this encounter.   Follow-up: Return in about 3 weeks (around 08/15/2016) for weight check .   Boykin Nearing MD

## 2016-07-25 NOTE — Patient Instructions (Signed)
Make sure to get in three meals per day to provide adequate energy for your body and promote healthy metabolism. Try to eat balanced meals (see handout). Try to plan out something easy to prepare for dinner to help reduce the temptation to skip dinner when you get home.   For breakfast-try to include some carbohydrates, fiber, and protein at breakfast.   Try to drink more water and less sugar sweetened beverages. Maybe try [facility] or a similar product if you have a soda craving.   Try to do walks most days of the week.

## 2016-07-25 NOTE — Progress Notes (Signed)
Medical Nutrition Therapy:  Appt start time: 1030 end time:  1130.   Assessment:  Primary concerns today: Pt referred for obesity. Pt says she is trying to lose weight and her MD recommended she schedule an appointment with a nutritionist. Pt says she was prescribed Topamax today by her MD for weight loss. Pt has Crohn's Disease but says she has not been having any complications/symptoms from it lately.   Preferred Learning Style:   No preference indicated   Learning Readiness:  Ready  MEDICATIONS: See list.    DIETARY INTAKE:  Usual eating pattern includes 1 meal per day and snacking off and on throughout the day.  Pt says she only eats dinner on the weekends, but not during the week because she often does not feel like cooking when she gets home. Pt says she used to meal prep and have foods prepped for dinner, but she has not been doing so recently. Pt says she knows she needs to get back in the routine of planning/prepping dinner meals.   Pt says she will often eat Special [employer] chips while she is working if she becomes hungry and is unable to take her lunch. Pt says she used to pack Activia yogurt as a snack for work and she is going to start doing that again instead of eating the chips.   Pt says she sometimes eats at the kitchen table but eats in the living room other times. Pt says she sometimes craves sodas. Pt says she is trying to stop drinking sodas and she often will add a lot of ice or water to her soda when she does drink one.   Work schedule:  5 am-3:30 pm typically. Sometimes she may not get off work until an hour or so later.   Sleep Pattern: Pt says she typically falls asleep around 10-11 pm and wakes to get up for work at 2:30 am. Pt says she typically gets 4-5 hours sleep per night. She says taking Ambien does help her sleep, but she tries to only take it when she feels it is absolutely necessary. Pt says she notices she sleeps better the nights after she has taken a walk  that day.   Everyday foods include coffee.  Avoided foods include liver, peas, beans, onions.    24-hr recall:  B ( AM): cup of coffee, honey oats granola bar  Snk ( AM): Jolly Rancher candy  L ( PM): Fried chicken breast, sliced potatoes with cheese, homemade kiwi lime pie Snk ( PM): 6 ritz crackers D ( PM): None reported  Snk ( PM): None reported.  Beverages: sweet tea, water   Usual physical activity: walks a lot at work per pt; walks 30 minutes at least 3 days per week.   Progress Towards Goal(s):  In progress.   Nutritional Diagnosis:  NI-5.11.1 Predicted suboptimal nutrient intake As related to skipping meals .  As evidenced by pt's reported diet recall.    Intervention:  Nutrition counseling provided. Dietitian provided education on how to eat balanced meals. Dietitian educated pt on the importance of getting in 3 meals per day and how skipping meals can slow down metabolism and lead to weight gain, etc. Dietitian discussed strategies to make eating dinner easier for pt. Dietitian encouraged pt to plan/prep meals for dinner ahead of time so it is easier for her to get in a balanced dinner when she gets off work. Dietitian discussed the importance of mindful eating. Dietitian discussed the importance of getting  in adequate sleep. Dietitian encouraged pt to try to walk most days of the week to get in more activity, as well as to help with sleep. Dietitian encouraged pt to drink less sugar sweetened beverages and more water. Dietitian discussed the benefits of probiotics and encouraged pt to eat more yogurt with probiotics. Dietitian recommended pt start taking a multivitamin. Pt appeared agreeable to information/goals discussed.   Goals:  Make sure to get in three meals per day to provide adequate energy for your body and promote healthy metabolism. Try to eat balanced meals (see handout). Try to plan out something easy to prepare for dinner to help reduce the temptation to skip dinner  when you get home.   For breakfast-try to include some carbohydrates, fiber, and protein at breakfast.   Try to drink more water and less sugar sweetened beverages. Maybe try [facility] or a similar product if you have a soda craving.   Try to do walks most days of the week.   Take a multivitamin.   Teaching Method Utilized: Visual Auditory  Handouts given during visit include:  Balanced Plate  List of food groups on balanced plate   Barriers to learning/adherence to lifestyle change: None indicated.   Demonstrated degree of understanding via:  Teach Back   Monitoring/Evaluation:  Dietary intake, exercise, and body weight in 1 month(s).

## 2016-08-18 ENCOUNTER — Ambulatory Visit: Payer: 59 | Admitting: Registered"

## 2016-08-22 ENCOUNTER — Ambulatory Visit: Payer: 59 | Attending: Family Medicine | Admitting: Family Medicine

## 2016-08-22 ENCOUNTER — Encounter: Payer: Self-pay | Admitting: Family Medicine

## 2016-08-22 DIAGNOSIS — Z6838 Body mass index (BMI) 38.0-38.9, adult: Secondary | ICD-10-CM | POA: Insufficient documentation

## 2016-08-22 DIAGNOSIS — K5090 Crohn's disease, unspecified, without complications: Secondary | ICD-10-CM | POA: Insufficient documentation

## 2016-08-22 DIAGNOSIS — Z79899 Other long term (current) drug therapy: Secondary | ICD-10-CM | POA: Insufficient documentation

## 2016-08-22 DIAGNOSIS — I10 Essential (primary) hypertension: Secondary | ICD-10-CM | POA: Diagnosis present

## 2016-08-22 MED ORDER — ZOLPIDEM TARTRATE 5 MG PO TABS: 5.0000 mg | ORAL_TABLET | Freq: Every evening | ORAL | 5 refills | Status: DC | PRN

## 2016-08-22 MED ORDER — PHENTERMINE HCL 37.5 MG PO TABS: 75.0000 mg | ORAL_TABLET | Freq: Every day | ORAL | 1 refills | Status: DC

## 2016-08-22 MED ORDER — TOPIRAMATE 25 MG PO CPSP: 50.0000 mg | ORAL_CAPSULE | Freq: Every day | ORAL | 1 refills | Status: DC

## 2016-08-22 MED ORDER — VERAPAMIL HCL ER 180 MG PO CP24: 180.0000 mg | ORAL_CAPSULE | Freq: Every day | ORAL | 3 refills | Status: DC

## 2016-08-22 MED ORDER — TOPIRAMATE 25 MG PO CPSP: ORAL_CAPSULE | ORAL | 1 refills | Status: DC

## 2016-08-22 NOTE — Patient Instructions (Addendum)
Arynn was seen today for medication management.  Diagnoses and all orders for this visit:  Obesity, morbid, BMI 40.0-49.9 (HCC) -     phentermine (ADIPEX-P) 37.5 MG tablet; Take 2 tablets (75 mg total) by mouth daily before breakfast. Take one tablet daily for 14 days, then two tablets daily -     topiramate (TOPAMAX) 25 MG capsule; Take one tablet by mouth daily for 14 days, then two tablets daily  Essential hypertension -     verapamil (VERELAN PM) 180 MG 24 hr capsule; Take 1 capsule (180 mg total) by mouth at bedtime.  Other orders -     zolpidem (AMBIEN) 5 MG tablet; Take 1 tablet (5 mg total) by mouth at bedtime as needed. for sleep   F/u in 6-8 weeks for weight loss   Dr. [facility]  [facility]  [address] [place], [place], [place] [address] [phone]   [facility] [address], [place], [place] [address]  [phone]

## 2016-08-22 NOTE — Progress Notes (Signed)
Subjective:  Patient ID: Andrea Wilkins, female    DOB: 1965-03-18  Age: 51 y.o. MRN: [id number]  CC: Medication Management   HPI Baileigh Modisette has hx of Crohn's disease, RA, HTN, chronic occipital HA, presents for    1. Weight check: she is morbidly obese. She reports the following stressors contribute to over eating and weight gain.  She is stressed with planning her wedding in October, 2018.  She is stressed due to illness in her mother, Mick Sell who is being evaluated for possible bladder cancer.   Eating lunch at 1:30/2 PM Eating last meal by 6 PM  She has started phentermine and topamax. She report her appetite has declined. She has cut out fried foods and sweets.   Previous pharmacotherapy: Phentermine in 2004-lost 50-60  lbs for breast reduction. She took it for 8 months, then tapered off over 4 months.  Kept weight down for 10 years. Contrave prescribed- not covered by insurance  Social History  Substance Use Topics  . Smoking status: Never Smoker  . Smokeless tobacco: Never Used  . Alcohol use No   Outpatient Medications Prior to Visit  Medication Sig Dispense Refill  . baclofen (LIORESAL) 10 MG tablet Take 10 mg by mouth 3 (three) times daily.    . hydrochlorothiazide (HYDRODIURIL) 25 MG tablet TAKE 1 TABLET BY MOUTH EVERY DAY 30 tablet 5  . phentermine (ADIPEX-P) 37.5 MG tablet Take one tablet daily for 14 days, then two tablets daily 42 tablet 0  . topiramate (TOPAMAX) 25 MG capsule Take one tablet by mouth daily for 14 days, then two tablets daily 42 capsule 0  . verapamil (VERELAN PM) 180 MG 24 hr capsule Take 1 capsule (180 mg total) by mouth at bedtime. 90 capsule 3  . zolpidem (AMBIEN) 5 MG tablet Take 1 tablet (5 mg total) by mouth at bedtime as needed. for sleep 30 tablet 5   No facility-administered medications prior to visit.     ROS Review of Systems  Constitutional: Positive for fatigue. Negative for chills and fever.  Eyes: Negative for  visual disturbance.  Respiratory: Negative for shortness of breath.   Cardiovascular: Positive for leg swelling (L leg after prolonged standing ). Negative for chest pain.  Gastrointestinal: Negative for abdominal pain and blood in stool.  Musculoskeletal: Negative for arthralgias and back pain.       Swelling of middle finger in L hand comes and goes   Skin: Negative for rash.  Allergic/Immunologic: Negative for immunocompromised state.  Neurological: Positive for numbness (in arms and hands ) and headaches.  Hematological: Negative for adenopathy. Does not bruise/bleed easily.  Psychiatric/Behavioral: Positive for sleep disturbance. Negative for dysphoric mood and suicidal ideas.    Objective:  BP 121/81   Pulse (!) 101   Temp 98.6 F (37 C) (Oral)   Ht 5' 3"  (1.6 m)   Wt 220 lb (99.8 kg)   SpO2 100%   BMI 38.97 kg/m  Waist circumference 41.5 inches   BP/Weight 08/22/2016 07/25/2016 3/71/6967  Systolic BP 893 - 810  Diastolic BP 81 - 83  Wt. (Lbs) 220 233.25 234.8  BMI 38.97 41.32 41.59   Wt Readings from Last 3 Encounters:  08/22/16 220 lb (99.8 kg)  07/25/16 233 lb 4 oz (105.8 kg)  07/25/16 234 lb 12.8 oz (106.5 kg)    Physical Exam  Constitutional: She is oriented to person, place, and time. She appears well-developed and well-nourished. No distress.  obese  HENT:  Head: Normocephalic  and atraumatic.  Cardiovascular: Normal rate, regular rhythm, normal heart sounds and intact distal pulses.   Pulmonary/Chest: Effort normal and breath sounds normal.  Musculoskeletal: She exhibits no edema.  Neurological: She is alert and oriented to person, place, and time.  Skin: Skin is warm and dry. No rash noted.  Psychiatric: She has a normal mood and affect.   Lab Results  Component Value Date   HGBA1C 4.9 03/14/2016   Assessment & Plan:   There are no diagnoses linked to this encounter. Latanja was seen today for medication management.  Diagnoses and all orders for this  visit:  Obesity, morbid, BMI 40.0-49.9 ([place]) -     Discontinue: phentermine (ADIPEX-P) 37.5 MG tablet; Take 2 tablets (75 mg total) by mouth daily before breakfast. Take one tablet daily for 14 days, then two tablets daily -     Discontinue: topiramate (TOPAMAX) 25 MG capsule; Take one tablet by mouth daily for 14 days, then two tablets daily -     phentermine (ADIPEX-P) 37.5 MG tablet; Take 2 tablets (75 mg total) by mouth daily before breakfast. -     topiramate (TOPAMAX) 25 MG capsule; Take 2 capsules (50 mg total) by mouth daily.  Essential hypertension -     verapamil (VERELAN PM) 180 MG 24 hr capsule; Take 1 capsule (180 mg total) by mouth at bedtime.  Other orders -     zolpidem (AMBIEN) 5 MG tablet; Take 1 tablet (5 mg total) by mouth at bedtime as needed. for sleep   No orders of the defined types were placed in this encounter.   Follow-up: Return in about 8 weeks (around 10/17/2016) for weight loss .   Boykin Nearing MD

## 2016-08-24 NOTE — Assessment & Plan Note (Signed)
Obesity on weight loss medication Improved and tolerating medication Continue phentermine 75 mg daily and Topamax 50 mg daily

## 2016-10-03 ENCOUNTER — Encounter: Payer: Self-pay | Admitting: Family Medicine

## 2016-10-03 ENCOUNTER — Ambulatory Visit: Payer: 59 | Attending: Family Medicine | Admitting: Family Medicine

## 2016-10-03 DIAGNOSIS — Z760 Encounter for issue of repeat prescription: Secondary | ICD-10-CM | POA: Diagnosis present

## 2016-10-03 DIAGNOSIS — Z79899 Other long term (current) drug therapy: Secondary | ICD-10-CM | POA: Insufficient documentation

## 2016-10-03 DIAGNOSIS — Z6837 Body mass index (BMI) 37.0-37.9, adult: Secondary | ICD-10-CM | POA: Insufficient documentation

## 2016-10-03 DIAGNOSIS — I10 Essential (primary) hypertension: Secondary | ICD-10-CM

## 2016-10-03 MED ORDER — HYDROCHLOROTHIAZIDE 25 MG PO TABS: 25.0000 mg | ORAL_TABLET | Freq: Every day | ORAL | 1 refills | Status: DC

## 2016-10-03 MED ORDER — PHENTERMINE HCL 37.5 MG PO TABS: 75.0000 mg | ORAL_TABLET | Freq: Every day | ORAL | 1 refills | Status: DC

## 2016-10-03 MED ORDER — TOPIRAMATE 25 MG PO CPSP: 50.0000 mg | ORAL_CAPSULE | Freq: Every day | ORAL | 1 refills | Status: DC

## 2016-10-03 NOTE — Patient Instructions (Addendum)
DASH Eating Plan DASH stands for "Dietary Approaches to Stop Hypertension." The DASH eating plan is a healthy eating plan that has been shown to reduce high blood pressure (hypertension). It may also reduce your risk for type 2 diabetes, heart disease, and stroke. The DASH eating plan may also help with weight loss. What are tips for following this plan? General guidelines  Avoid eating more than 2,300 mg (milligrams) of salt (sodium) a day. If you have hypertension, you may need to reduce your sodium intake to 1,500 mg a day.  Limit alcohol intake to no more than 1 drink a day for nonpregnant women and 2 drinks a day for men. One drink equals 12 oz of beer, 5 oz of wine, or 1 oz of hard liquor.  Work with your health care provider to maintain a healthy body weight or to lose weight. Ask what an ideal weight is for you.  Get at least 30 minutes of exercise that causes your heart to beat faster (aerobic exercise) most days of the week. Activities may include walking, swimming, or biking.  Work with your health care provider or diet and nutrition specialist (dietitian) to adjust your eating plan to your individual calorie needs. Reading food labels  Check food labels for the amount of sodium per serving. Choose foods with less than 5 percent of the Daily Value of sodium. Generally, foods with less than 300 mg of sodium per serving fit into this eating plan.  To find whole grains, look for the word "whole" as the first word in the ingredient list. Shopping  Buy products labeled as "low-sodium" or "no salt added."  Buy fresh foods. Avoid canned foods and premade or frozen meals. Cooking  Avoid adding salt when cooking. Use salt-free seasonings or herbs instead of table salt or sea salt. Check with your health care provider or pharmacist before using salt substitutes.  Do not fry foods. Cook foods using healthy methods such as baking, boiling, grilling, and broiling instead.  Cook with  heart-healthy oils, such as olive, canola, soybean, or sunflower oil. Meal planning   Eat a balanced diet that includes: ? 5 or more servings of fruits and vegetables each day. At each meal, try to fill half of your plate with fruits and vegetables. ? Up to 6-8 servings of whole grains each day. ? Less than 6 oz of lean meat, poultry, or fish each day. A 3-oz serving of meat is about the same size as a deck of cards. One egg equals 1 oz. ? 2 servings of low-fat dairy each day. ? A serving of nuts, seeds, or beans 5 times each week. ? Heart-healthy fats. Healthy fats called Omega-3 fatty acids are found in foods such as flaxseeds and coldwater fish, like sardines, salmon, and mackerel.  Limit how much you eat of the following: ? Canned or prepackaged foods. ? Food that is high in trans fat, such as fried foods. ? Food that is high in saturated fat, such as fatty meat. ? Sweets, desserts, sugary drinks, and other foods with added sugar. ? Full-fat dairy products.  Do not salt foods before eating.  Try to eat at least 2 vegetarian meals each week.  Eat more home-cooked food and less restaurant, buffet, and fast food.  When eating at a restaurant, ask that your food be prepared with less salt or no salt, if possible. What foods are recommended? The items listed may not be a complete list. Talk with your dietitian about what  dietary choices are best for you. Grains Whole-grain or whole-wheat bread. Whole-grain or whole-wheat pasta. Brown rice. Modena Morrow. Bulgur. Whole-grain and low-sodium cereals. Pita bread. Low-fat, low-sodium crackers. Whole-wheat flour tortillas. Vegetables Fresh or frozen vegetables (raw, steamed, roasted, or grilled). Low-sodium or reduced-sodium tomato and vegetable juice. Low-sodium or reduced-sodium tomato sauce and tomato paste. Low-sodium or reduced-sodium canned vegetables. Fruits All fresh, dried, or frozen fruit. Canned fruit in natural juice (without  added sugar). Meat and other protein foods Skinless chicken or [place]. Ground chicken or [place]. Pork with fat trimmed off. Fish and seafood. Egg whites. Dried beans, peas, or lentils. Unsalted nuts, nut butters, and seeds. Unsalted canned beans. Lean cuts of beef with fat trimmed off. Low-sodium, lean deli meat. Dairy Low-fat (1%) or fat-free (skim) milk. Fat-free, low-fat, or reduced-fat cheeses. Nonfat, low-sodium ricotta or cottage cheese. Low-fat or nonfat yogurt. Low-fat, low-sodium cheese. Fats and oils Soft margarine without trans fats. Vegetable oil. Low-fat, reduced-fat, or light mayonnaise and salad dressings (reduced-sodium). Canola, safflower, olive, soybean, and sunflower oils. Avocado. Seasoning and other foods Herbs. Spices. Seasoning mixes without salt. Unsalted popcorn and pretzels. Fat-free sweets. What foods are not recommended? The items listed may not be a complete list. Talk with your dietitian about what dietary choices are best for you. Grains Baked goods made with fat, such as croissants, muffins, or some breads. Dry pasta or rice meal packs. Vegetables Creamed or fried vegetables. Vegetables in a cheese sauce. Regular canned vegetables (not low-sodium or reduced-sodium). Regular canned tomato sauce and paste (not low-sodium or reduced-sodium). Regular tomato and vegetable juice (not low-sodium or reduced-sodium). Angie Fava. Olives. Fruits Canned fruit in a light or heavy syrup. Fried fruit. Fruit in cream or butter sauce. Meat and other protein foods Fatty cuts of meat. Ribs. Fried meat. Berniece Salines. Sausage. Bologna and other processed lunch meats. Salami. Fatback. Hotdogs. Bratwurst. Salted nuts and seeds. Canned beans with added salt. Canned or smoked fish. Whole eggs or egg yolks. Chicken or [place] with skin. Dairy Whole or 2% milk, cream, and half-and-half. Whole or full-fat cream cheese. Whole-fat or sweetened yogurt. Full-fat cheese. Nondairy creamers. Whipped toppings.  Processed cheese and cheese spreads. Fats and oils Butter. Stick margarine. Lard. Shortening. Ghee. Bacon fat. Tropical oils, such as coconut, palm kernel, or palm oil. Seasoning and other foods Salted popcorn and pretzels. Onion salt, garlic salt, seasoned salt, table salt, and sea salt. Worcestershire sauce. Tartar sauce. Barbecue sauce. Teriyaki sauce. Soy sauce, including reduced-sodium. Steak sauce. Canned and packaged gravies. Fish sauce. Oyster sauce. Cocktail sauce. Horseradish that you find on the shelf. Ketchup. Mustard. Meat flavorings and tenderizers. Bouillon cubes. Hot sauce and Tabasco sauce. Premade or packaged marinades. Premade or packaged taco seasonings. Relishes. Regular salad dressings. Where to find more information:  National Heart, Lung, and [place]: https://wilson-eaton.com/  American Heart Association: www.heart.org Summary  The DASH eating plan is a healthy eating plan that has been shown to reduce high blood pressure (hypertension). It may also reduce your risk for type 2 diabetes, heart disease, and stroke.  With the DASH eating plan, you should limit salt (sodium) intake to 2,300 mg a day. If you have hypertension, you may need to reduce your sodium intake to 1,500 mg a day.  When on the DASH eating plan, aim to eat more fresh fruits and vegetables, whole grains, lean proteins, low-fat dairy, and heart-healthy fats.  Work with your health care provider or diet and nutrition specialist (dietitian) to adjust your eating plan to your individual  calorie needs. This information is not intended to replace advice given to you by your health care provider. Make sure you discuss any questions you have with your health care provider. Document Released: 01/02/2011 Document Revised: 01/07/2016 Document Reviewed: 01/07/2016 Elsevier Interactive Patient Education  2017 Elsevier Inc.     Obesity, Adult Obesity is having too much body fat. If you have a BMI of 30 or more,  you are obese. BMI is a number that explains how much body fat you have. Obesity is often caused by taking in (consuming) more calories than your body uses. Obesity can cause serious health problems. Changing your lifestyle can help to treat obesity. Follow these instructions at home: Eating and drinking   Follow advice from your doctor about what to eat and drink. Your doctor may tell you to: ? Cut down on (limit) fast foods, sweets, and processed snack foods. ? Choose low-fat options. For example, choose low-fat milk instead of whole milk. ? Eat 5 or more servings of fruits or vegetables every day. ? Eat at home more often. This gives you more control over what you eat. ? Choose healthy foods when you eat out. ? Learn what a healthy portion size is. A portion size is the amount of a certain food that is healthy for you to eat at one time. This is different for each person. ? Keep low-fat snacks available. ? Avoid sugary drinks. These include soda, fruit juice, iced tea that is sweetened with sugar, and flavored milk. ? Eat a healthy breakfast.  Drink enough water to keep your pee (urine) clear or pale yellow.  Do not go without eating for long periods of time (do not fast).  Do not go on popular or trendy diets (fad diets). Physical Activity  Exercise often, as told by your doctor. Ask your doctor: ? What types of exercise are safe for you. ? How often you should exercise.  Warm up and stretch before being active.  Do slow stretching after being active (cool down).  Rest between times of being active. Lifestyle  Limit how much time you spend in front of your TV, computer, or video game system (be less sedentary).  Find ways to reward yourself that do not involve food.  Limit alcohol intake to no more than 1 drink a day for nonpregnant women and 2 drinks a day for men. One drink equals 12 oz of beer, 5 oz of wine, or 1 oz of hard liquor. General instructions  Keep a weight  loss journal. This can help you keep track of: ? The food that you eat. ? The exercise that you do.  Take over-the-counter and prescription medicines only as told by your doctor.  Take vitamins and supplements only as told by your doctor.  Think about joining a support group. Your doctor may be able to help with this.  Keep all follow-up visits as told by your doctor. This is important. Contact a doctor if:  You cannot meet your weight loss goal after you have changed your diet and lifestyle for 6 weeks. This information is not intended to replace advice given to you by your health care provider. Make sure you discuss any questions you have with your health care provider. Document Released: 04/07/2011 Document Revised: 06/21/2015 Document Reviewed: 11/01/2014 Elsevier Interactive Patient Education  2018 [employer].

## 2016-10-03 NOTE — Progress Notes (Signed)
Patient is here for weight loss f/up

## 2016-10-03 NOTE — Progress Notes (Signed)
Subjective:  Patient ID: Andrea Wilkins, female    DOB: October 10, 1965  Age: 51 y.o. MRN: [id number]  CC: Follow-up   HPI Andrea Wilkins presents for medication refill. History of hypertension and obesity. She is exercising and is not adherent to low salt diet.  Blood pressure is well controlled at home. Cardiac symptoms none. Patient denies chest pain, chest pressure/discomfort, claudication, dyspnea, fatigue, lower extremity edema, near-syncope, palpitations and syncope.  Cardiovascular risk factors: hypertension and obesity (BMI >= 30 kg/m2). Use of agents associated with hypertension: none. History of target organ damage: none.  She is requesting medication refills on phentermine. She reports weight loss from 236 pounds to 212 pounds on current medication. Dietary interventions have included discontinuing carbs and red meat intake. Exercise interventions include walking 30 minutes per day.   Outpatient Medications Prior to Visit  Medication Sig Dispense Refill  . hydrochlorothiazide (HYDRODIURIL) 25 MG tablet TAKE 1 TABLET BY MOUTH EVERY DAY 30 tablet 5  . phentermine (ADIPEX-P) 37.5 MG tablet Take 2 tablets (75 mg total) by mouth daily before breakfast. 60 tablet 1  . topiramate (TOPAMAX) 25 MG capsule Take 2 capsules (50 mg total) by mouth daily. 60 capsule 1  . baclofen (LIORESAL) 10 MG tablet Take 10 mg by mouth 3 (three) times daily.    . verapamil (VERELAN PM) 180 MG 24 hr capsule Take 1 capsule (180 mg total) by mouth at bedtime. 90 capsule 3  . zolpidem (AMBIEN) 5 MG tablet Take 1 tablet (5 mg total) by mouth at bedtime as needed. for sleep 30 tablet 5   No facility-administered medications prior to visit.     ROS Review of Systems  Constitutional:       Obese  HENT: Negative.   Respiratory: Negative.   Cardiovascular: Negative.   Gastrointestinal: Negative.   Skin: Negative.   Psychiatric/Behavioral: Negative.     Objective:  BP 132/83 (BP Location: Left Arm, Patient  Position: Sitting, Cuff Size: Normal)   Pulse 99   Temp 98.9 F (37.2 C) (Oral)   Resp 18   Ht 5' 3"  (1.6 m)   Wt 212 lb (96.2 kg)   SpO2 90%   BMI 37.55 kg/m   BP/Weight 10/03/2016 08/22/2016 2/69/4854  Systolic BP 627 035 -  Diastolic BP 83 81 -  Wt. (Lbs) 212 220 233.25  BMI 37.55 38.97 41.32     Physical Exam  Constitutional: She appears well-developed and well-nourished.  HENT:  Head: Normocephalic and atraumatic.  Right Ear: External ear normal.  Left Ear: External ear normal.  Nose: Nose normal.  Mouth/Throat: Oropharynx is clear and moist.  Eyes: Pupils are equal, round, and reactive to light. Conjunctivae are normal.  Neck: Normal range of motion. Neck supple. No thyromegaly present.  Cardiovascular: Normal rate, regular rhythm, normal heart sounds and intact distal pulses.   Pulmonary/Chest: Effort normal and breath sounds normal.  Abdominal: Soft. Bowel sounds are normal. There is no tenderness.  Lymphadenopathy:    She has no cervical adenopathy.  Skin: Skin is warm and dry.  Psychiatric: She has a normal mood and affect.  Nursing note and vitals reviewed.  Assessment & Plan:   Problem List Items Addressed This Visit      Cardiovascular and Mediastinum   Hypertension (Chronic)   Relevant Medications   hydrochlorothiazide (HYDRODIURIL) 25 MG tablet     Other   Obesity, morbid, BMI 40.0-49.9 (HCC) - Primary (Chronic)   Relevant Medications   phentermine (ADIPEX-P) 37.5 MG tablet  topiramate (TOPAMAX) 25 MG capsule      Meds ordered this encounter  Medications  . hydrochlorothiazide (HYDRODIURIL) 25 MG tablet    Sig: Take 1 tablet (25 mg total) by mouth daily.    Dispense:  90 tablet    Refill:  1    Order Specific Question:   Supervising Provider    Answer:   Tresa Garter [id number]  . phentermine (ADIPEX-P) 37.5 MG tablet    Sig: Take 2 tablets (75 mg total) by mouth daily before breakfast.    Dispense:  60 tablet    Refill:  1     Order Specific Question:   Supervising Provider    Answer:   Tresa Garter [id number]  . topiramate (TOPAMAX) 25 MG capsule    Sig: Take 2 capsules (50 mg total) by mouth daily.    Dispense:  60 capsule    Refill:  1    Order Specific Question:   Supervising Provider    Answer:   Tresa Garter [[id number]]    Follow-up: Return in about 3 months (around 01/02/2017) for Obesity/ HTN.   Alfonse Spruce FNP

## 2016-10-17 ENCOUNTER — Other Ambulatory Visit: Payer: Self-pay | Admitting: Family Medicine

## 2016-10-20 NOTE — Telephone Encounter (Signed)
CMA call regarding prescription is ready for pick @ front desk   Patient did not answer but left a detailed message

## 2016-11-04 ENCOUNTER — Encounter: Payer: Self-pay | Admitting: Physician Assistant

## 2017-01-02 ENCOUNTER — Ambulatory Visit: Payer: 59 | Admitting: Family Medicine

## 2017-08-28 ENCOUNTER — Ambulatory Visit: Payer: 59 | Admitting: Nurse Practitioner

## 2017-10-23 ENCOUNTER — Ambulatory Visit: Payer: BLUE CROSS/BLUE SHIELD | Attending: Family Medicine | Admitting: Family Medicine

## 2017-10-23 ENCOUNTER — Encounter: Payer: Self-pay | Admitting: Family Medicine

## 2017-10-23 VITALS — BP 151/95 | HR 78 | Temp 98.4°F | Resp 12 | Ht 63.0 in | Wt 221.4 lb

## 2017-10-23 DIAGNOSIS — Z79899 Other long term (current) drug therapy: Secondary | ICD-10-CM

## 2017-10-23 DIAGNOSIS — M064 Inflammatory polyarthropathy: Secondary | ICD-10-CM | POA: Insufficient documentation

## 2017-10-23 DIAGNOSIS — K5090 Crohn's disease, unspecified, without complications: Secondary | ICD-10-CM | POA: Insufficient documentation

## 2017-10-23 DIAGNOSIS — I10 Essential (primary) hypertension: Secondary | ICD-10-CM | POA: Diagnosis not present

## 2017-10-23 DIAGNOSIS — Z9889 Other specified postprocedural states: Secondary | ICD-10-CM | POA: Insufficient documentation

## 2017-10-23 DIAGNOSIS — G4700 Insomnia, unspecified: Secondary | ICD-10-CM

## 2017-10-23 DIAGNOSIS — R51 Headache: Secondary | ICD-10-CM | POA: Insufficient documentation

## 2017-10-23 DIAGNOSIS — M1990 Unspecified osteoarthritis, unspecified site: Secondary | ICD-10-CM | POA: Diagnosis not present

## 2017-10-23 DIAGNOSIS — M1380 Other specified arthritis, unspecified site: Secondary | ICD-10-CM

## 2017-10-23 MED ORDER — HYDROCHLOROTHIAZIDE 25 MG PO TABS: 25.0000 mg | ORAL_TABLET | Freq: Every day | ORAL | 1 refills | Status: DC

## 2017-10-23 MED ORDER — ZOLPIDEM TARTRATE 5 MG PO TABS: 5.0000 mg | ORAL_TABLET | Freq: Every evening | ORAL | 1 refills | Status: DC | PRN

## 2017-10-23 MED ORDER — VERAPAMIL HCL ER 180 MG PO CP24: 180.0000 mg | ORAL_CAPSULE | Freq: Every day | ORAL | 1 refills | Status: DC

## 2017-10-23 NOTE — Progress Notes (Signed)
Subjective:    Patient ID: Andrea Wilkins, female    DOB: 07/23/1965, 52 y.o.   MRN: [id number]  HPI 52 year old female last seen in the office on 10/03/16 who presents for continued follow-up of her Hypertension and Insomnia.  Patient reports that she is seeing a rheumatologist, Dr. Lenna Gilford and patient was told that she does not have rheumatoid arthritis but rather inflammatory arthritis.  Patient reports that she has been on methotrexate for the past 2 years and this has been working well for her to decrease joint pain and inflammation.  Patient states that she was also diagnosed with Crohn's disease which is followed by gastroenterologist.  Patient states that she does need refill of her blood pressure medication and patient would like refill of Ambien to take as needed for insomnia.  Patient states that she is out of her blood pressure medication currently and she has had an occasional dull headache.  Patient states that she only takes the Ambien on Friday or Saturday nights when she knows that she does not have to work the next day.      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  Patient does not smoke or drink.  Patient's past surgical history is remarkable for right knee arthroscopy secondary to a torn meniscus and patient states that she also had left wrist surgery for removal of a ganglion cyst.  Patient states that her family history is significant for mother with arthritis and hypertension and patient's father has diabetes.  Patient states that there is no heart disease, strokes or cancer in her family history.     Review of Systems  Constitutional: Positive for fatigue. Negative for chills and fever.  Respiratory: Negative for cough and shortness of breath.   Gastrointestinal: Positive for nausea. Negative for abdominal pain.  Genitourinary: Negative for dysuria and flank pain.  Musculoskeletal: Positive for arthralgias (Occasional right knee pain, patient with history of meniscus tear  requiring surgery). Negative for gait problem and joint swelling.  Neurological: Positive for headaches. Negative for dizziness, weakness, light-headedness and numbness.  Psychiatric/Behavioral: Positive for sleep disturbance. Negative for suicidal ideas. The patient is not nervous/anxious.        Objective:   Physical Exam BP (!) 151/95   Pulse 78   Temp 98.4 F (36.9 C) (Oral)   Resp 12   Ht 5' 3"  (1.6 m)   Wt 221 lb 6.4 oz (100.4 kg)   SpO2 99%   BMI 39.22 kg/m Nurse's notes and vital signs reviewed General-well-nourished, well-developed overweight older female in no acute distress ENT-TMs gray, nares with mild edema, normal oropharynx Neck-supple, no lymphadenopathy, no thyromegaly, no carotid bruit Cardiovascular-regular rate and rhythm Lungs-clear to auscultation bilaterally Abdomen-soft, nontender Back-no CVA tenderness Extremities-no edema      Assessment & Plan:  1. Essential hypertension Patient's blood pressure is elevated at today's visit and she has been out of her blood pressure medication.  Patient provided with refill of hydrochlorothiazide and verapamil.  Patient is encouraged to have a blood pressure rechecked to make sure that blood pressure normalizes after restart of her medications.  Patient will have lipid panel at today's visit. - hydrochlorothiazide (HYDRODIURIL) 25 MG tablet; Take 1 tablet (25 mg total) by mouth daily.  Dispense: 90 tablet; Refill: 1 - verapamil (VERELAN PM) 180 MG 24 hr capsule; Take 1 capsule (180 mg total) by mouth at bedtime.  Dispense: 90 capsule; Refill: 1  2. Insomnia, unspecified type Patient with complaint of difficulty with sleep  and patient has taken Ambien in the past with good success.  Patient is aware that she should not drive or operate machinery after taking the Ambien and she should only take the medication when she has at least 8 hours to devote to sleep.  Patient is provided with refill of Ambien at today's visit -  zolpidem (AMBIEN) 5 MG tablet; Take 1 tablet (5 mg total) by mouth at bedtime as needed. for sleep  Dispense: 30 tablet; Refill: 1  3. Encounter for long-term current use of medication Patient reports that she has inflammatory arthritis as well as Crohn's disease.  Patient has been on methotrexate for the past 2 years.  Patient will have CMP and CBC done in follow-up of her long-term use of high-risk medication - Comprehensive metabolic panel - Lipid panel - CBC with Differential - TSH  4. Inflammatory arthritis Patient reports that she has been treated by rheumatology and her diagnosis has been changed from rheumatoid arthritis to inflammatory arthritis and patient is currently taking methotrexate which has been controlling her symptoms  .  Patient was offered influenza immunization at today's visit  An After Visit Summary was printed and given to the patient.  Return in about 6 months (around 04/23/2018) for htn.

## 2017-10-24 LAB — COMPREHENSIVE METABOLIC PANEL WITH GFR
ALT: 16 IU/L (ref 0–32)
AST: 15 IU/L (ref 0–40)
Albumin/Globulin Ratio: 1.7 (ref 1.2–2.2)
Albumin: 4.4 g/dL (ref 3.5–5.5)
Alkaline Phosphatase: 100 IU/L (ref 39–117)
BUN/Creatinine Ratio: 14 (ref 9–23)
BUN: 10 mg/dL (ref 6–24)
Bilirubin Total: 0.4 mg/dL (ref 0.0–1.2)
CO2: 25 mmol/L (ref 20–29)
Calcium: 9.4 mg/dL (ref 8.7–10.2)
Chloride: 98 mmol/L (ref 96–106)
Creatinine, Ser: 0.73 mg/dL (ref 0.57–1.00)
GFR calc Af Amer: 111 mL/min/1.73
GFR calc non Af Amer: 96 mL/min/1.73
Globulin, Total: 2.6 g/dL (ref 1.5–4.5)
Glucose: 81 mg/dL (ref 65–99)
Potassium: 4.1 mmol/L (ref 3.5–5.2)
Sodium: 137 mmol/L (ref 134–144)
Total Protein: 7 g/dL (ref 6.0–8.5)

## 2017-10-24 LAB — CBC WITH DIFFERENTIAL/PLATELET
Basophils Absolute: 0 x10E3/uL (ref 0.0–0.2)
Basos: 0 %
EOS (ABSOLUTE): 0.2 x10E3/uL (ref 0.0–0.4)
Eos: 4 %
Hematocrit: 36.4 % (ref 34.0–46.6)
Hemoglobin: 11.6 g/dL (ref 11.1–15.9)
Immature Grans (Abs): 0 x10E3/uL (ref 0.0–0.1)
Immature Granulocytes: 0 %
Lymphocytes Absolute: 1.7 x10E3/uL (ref 0.7–3.1)
Lymphs: 38 %
MCH: 27.8 pg (ref 26.6–33.0)
MCHC: 31.9 g/dL (ref 31.5–35.7)
MCV: 87 fL (ref 79–97)
Monocytes Absolute: 0.4 x10E3/uL (ref 0.1–0.9)
Monocytes: 10 %
Neutrophils Absolute: 2.1 x10E3/uL (ref 1.4–7.0)
Neutrophils: 48 %
Platelets: 247 x10E3/uL (ref 150–450)
RBC: 4.17 x10E6/uL (ref 3.77–5.28)
RDW: 13.3 % (ref 12.3–15.4)
WBC: 4.5 x10E3/uL (ref 3.4–10.8)

## 2017-10-24 LAB — LIPID PANEL
Chol/HDL Ratio: 3.3 ratio (ref 0.0–4.4)
Cholesterol, Total: 290 mg/dL — ABNORMAL HIGH (ref 100–199)
HDL: 87 mg/dL
LDL Calculated: 184 mg/dL — ABNORMAL HIGH (ref 0–99)
Triglycerides: 96 mg/dL (ref 0–149)
VLDL Cholesterol Cal: 19 mg/dL (ref 5–40)

## 2017-10-24 LAB — TSH: TSH: 1.36 u[IU]/mL (ref 0.450–4.500)

## 2017-10-27 ENCOUNTER — Telehealth: Payer: Self-pay

## 2017-10-27 ENCOUNTER — Other Ambulatory Visit: Payer: Self-pay | Admitting: Family Medicine

## 2017-10-27 DIAGNOSIS — E785 Hyperlipidemia, unspecified: Secondary | ICD-10-CM

## 2017-10-27 DIAGNOSIS — Z79899 Other long term (current) drug therapy: Secondary | ICD-10-CM

## 2017-10-27 MED ORDER — ROSUVASTATIN CALCIUM 10 MG PO TABS: 10.0000 mg | ORAL_TABLET | Freq: Every day | ORAL | 3 refills | Status: DC

## 2017-10-27 NOTE — Telephone Encounter (Signed)
Patient was called, verified dob, and was given most recent lab results. The patient stated she would like to try Crestor and she uses [facility] off of [address]. Patient also stated she wanted to ask pcp was she going to refill her diet pills? Please fu at your earliest convenience.

## 2017-10-27 NOTE — Progress Notes (Signed)
See phone encounter. Patient agrees to take Crestor 10 mg daily for her hyperlipidemia and a RX was sent to her pharmacy and she will be notified of the need to have CMP done in 4-6 weeks after starting the use of Crestor.

## 2017-10-27 NOTE — Telephone Encounter (Signed)
With her age and blood pressure, I will not be filling the medication for weight loss as it could increase her blood pressure and increase her risk for stroke but also increase her risk for an abnormal heart rhythm. I will send in the RX for Crestor and she should come in for a lab visit to have a CMET about 4-6 weeks after starting the Crestor

## 2017-10-27 NOTE — Telephone Encounter (Signed)
-----   Message from Antony Blackbird, MD sent at 10/26/2017  9:41 PM EDT ----- Please notify patient that her bad cholesterol/LDL is very high at 184, goal is 100 or less. I would like to place her on generic Crestor. Please let me know if she is in agreement and if so then which pharmacy would she like the medication sent to. Her CMP was normal.

## 2017-10-27 NOTE — Telephone Encounter (Signed)
Patient was called and informed of most recent note put in from pcp. Patient verbalized understanding and had no further questions.

## 2018-01-29 ENCOUNTER — Ambulatory Visit: Payer: Self-pay | Admitting: *Deleted

## 2018-05-06 ENCOUNTER — Other Ambulatory Visit: Payer: Self-pay | Admitting: Family Medicine

## 2018-05-06 DIAGNOSIS — G4700 Insomnia, unspecified: Secondary | ICD-10-CM

## 2018-05-06 DIAGNOSIS — I10 Essential (primary) hypertension: Secondary | ICD-10-CM

## 2018-05-06 NOTE — Telephone Encounter (Signed)
1) Medication(s) Requested (by name): Verapamil Hydrochlorothiazide Zolpidem     2) [facility]acy [facility] Choice: [facility] on [address] 3) Special Requests:   Approved medications will be sent to the pharmacy, we will reach out if there is an issue.  Requests made after 3pm may not be addressed until the following business day!  If a patient is unsure of the name of the medication(s) please note and ask patient to call back when they are able to provide all info, do not send to responsible party until all information is available!

## 2018-05-07 ENCOUNTER — Other Ambulatory Visit: Payer: Self-pay | Admitting: Family Medicine

## 2018-05-07 DIAGNOSIS — I10 Essential (primary) hypertension: Secondary | ICD-10-CM

## 2018-05-07 MED ORDER — VERAPAMIL HCL ER 180 MG PO CP24: 180.0000 mg | ORAL_CAPSULE | Freq: Every day | ORAL | 0 refills | Status: DC

## 2018-05-07 MED ORDER — HYDROCHLOROTHIAZIDE 25 MG PO TABS: 25.0000 mg | ORAL_TABLET | Freq: Every day | ORAL | 0 refills | Status: DC

## 2018-05-07 NOTE — Progress Notes (Signed)
Patient ID: Andrea Wilkins, female   DOB: 06/07/1965, 53 y.o.   MRN: [id number]    Refill request received for zolpidem, HCTZ and verapamil. Last seen in office on 11/16/17 and does not have scheduled follow-up. I will send in 30 day supplies of BP medications with note to pharmacy that office visit/appointment needed prior to future refills.

## 2018-05-26 ENCOUNTER — Ambulatory Visit: Payer: BLUE CROSS/BLUE SHIELD | Attending: Family Medicine | Admitting: Family Medicine

## 2018-05-26 ENCOUNTER — Encounter: Payer: Self-pay | Admitting: Family Medicine

## 2018-05-26 ENCOUNTER — Other Ambulatory Visit: Payer: Self-pay

## 2018-05-26 DIAGNOSIS — G4700 Insomnia, unspecified: Secondary | ICD-10-CM

## 2018-05-26 DIAGNOSIS — I10 Essential (primary) hypertension: Secondary | ICD-10-CM | POA: Diagnosis not present

## 2018-05-26 DIAGNOSIS — E785 Hyperlipidemia, unspecified: Secondary | ICD-10-CM

## 2018-05-26 DIAGNOSIS — Z79899 Other long term (current) drug therapy: Secondary | ICD-10-CM | POA: Diagnosis not present

## 2018-05-26 MED ORDER — VERAPAMIL HCL ER 180 MG PO CP24: 180.0000 mg | ORAL_CAPSULE | Freq: Every day | ORAL | 4 refills | Status: DC

## 2018-05-26 MED ORDER — TRAZODONE HCL 50 MG PO TABS: 25.0000 mg | ORAL_TABLET | Freq: Every evening | ORAL | 4 refills | Status: DC | PRN

## 2018-05-26 MED ORDER — HYDROCHLOROTHIAZIDE 25 MG PO TABS: 25.0000 mg | ORAL_TABLET | Freq: Every day | ORAL | 4 refills | Status: DC

## 2018-05-26 MED ORDER — ZOLPIDEM TARTRATE 5 MG PO TABS: 5.0000 mg | ORAL_TABLET | Freq: Every evening | ORAL | 4 refills | Status: DC | PRN

## 2018-05-26 MED ORDER — ROSUVASTATIN CALCIUM 10 MG PO TABS: 10.0000 mg | ORAL_TABLET | Freq: Every day | ORAL | 4 refills | Status: DC

## 2018-05-26 NOTE — Progress Notes (Signed)
Virtual Visit via Telephone Note  I connected with Andrea Wilkins on 05/26/18 at 10:50 AM EDT by telephone and verified that I am speaking with the correct person using two identifiers.   I discussed the limitations, risks, security and privacy concerns of performing an evaluation and management service by telephone and the availability of in person appointments. I also discussed with the patient that there may be a patient responsible charge related to this service. The patient expressed understanding and agreed to proceed.  Patient Location: Car (patient reports that she has pulled into a parking lot) Provider Location: Office Others participating in call: Emilio Aspen, CMA   History of Present Illness:      53 yo female "seen" in follow-up of chronic medical issues including essential hypertension, hyperlipidemia and chronic issues with anxiety. Patient is compliant with her blood pressure medications and monitors her home blood pressure which has been in the 120's/80's. No headaches or dizziness related to her BP. She ran out of her cholesterol medication, Crestor, about 3 months ago and therefore has not been on the medication since that time. She did not have any issues while on the medication. She continues to have difficulty with sleep so she takes an otc sleep aid on most nights of the week and will then take Ambien on the weekends when she has more time to devote to sleep. She did try melatonin which did not work for her.   Past Medical History:  Diagnosis Date  . Arthritis 2015  . Crohn's disease ([place])   . Hypertension Dx 2013    Past Surgical History:  Procedure Laterality Date  . ABDOMINAL HYSTERECTOMY  2008  . BREAST SURGERY  2007  . CESAREAN SECTION  1988  . SMALL INTESTINE SURGERY  2008    Family History  Problem Relation Age of Onset  . Hypertension Mother   . Diabetes Sister   . Hypertension Sister   . Diabetes Father     Social History   Tobacco Use  .  Smoking status: Never Smoker  . Smokeless tobacco: Never Used  Substance Use Topics  . Alcohol use: No  . Drug use: No     Allergies  Allergen Reactions  . Shellfish Allergy   . Benadryl Allergy  [Diphenhydramine Hcl (Sleep)] Cough    Review of Systems  Constitutional: Positive for malaise/fatigue. Negative for chills, fever and weight loss.  HENT: Negative for congestion and sore throat.   Eyes: Negative for blurred vision and double vision.  Respiratory: Negative for cough, shortness of breath and wheezing.   Cardiovascular: Negative for chest pain and palpitations.  Gastrointestinal: Negative for abdominal pain, constipation, diarrhea, heartburn, nausea and vomiting.  Genitourinary: Negative for dysuria and frequency.  Musculoskeletal: Positive for joint pain (has inflammatory arthritis). Negative for myalgias.  Skin: Negative for itching and rash.  Neurological: Negative for dizziness and headaches.  Endo/Heme/Allergies: Negative for polydipsia. Does not bruise/bleed easily.  Psychiatric/Behavioral: Negative for depression and suicidal ideas. The patient is nervous/anxious and has insomnia.      Observations/Objective: No vital signs or physical exam conducted as visit was done via telephone  Assessment and Plan: 1. Essential hypertension Patient reports that her BP is controlled on her current medications which will be refilled and DASH diet encouraged. - hydrochlorothiazide (HYDRODIURIL) 25 MG tablet; Take 1 tablet (25 mg total) by mouth daily. To lower blood pressure  Dispense: 30 tablet; Refill: 4 - verapamil (VERELAN PM) 180 MG 24 hr capsule; Take 1  capsule (180 mg total) by mouth at bedtime. To lower blood pressure  Dispense: 30 capsule; Refill: 4  2. Insomnia, unspecified type Patient reports that she thinks that anxiety may be contributing to her sleep issues. She is willing to try the use of trazodone at bedtime on work nights to help with sleep and will take Ambien  on the weekends. Encouraged good sleep hygiene- dark quite bedroom and regular bedtime - zolpidem (AMBIEN) 5 MG tablet; Take 1 tablet (5 mg total) by mouth at bedtime as needed. for sleep. Do not take on the nights that you take other medications for sleep  Dispense: 15 tablet; Refill: 4 - traZODone (DESYREL) 50 MG tablet; Take 0.5-1 tablets (25-50 mg total) by mouth at bedtime as needed for sleep. Do not take on the nights that you take Ambien  Dispense: 30 tablet; Refill: 4  3. Hyperlipidemia, unspecified hyperlipidemia type Return for lab visit for lipid panel. Low fat diet and exercise encouraged. Refill of Crestor - rosuvastatin (CRESTOR) 10 MG tablet; Take 1 tablet (10 mg total) by mouth daily. To lower cholesterol  Dispense: 30 tablet; Refill: 4  4. Encounter for long-term (current) use of medications Return for labs visit for CMET in follow-up of long term use of medication  Follow Up Instructions:Return in about 4 months (around 09/25/2018) for HTN/lipids- labs in 4 weeks.    I discussed the assessment and treatment plan with the patient. The patient was provided an opportunity to ask questions and all were answered. The patient agreed with the plan and demonstrated an understanding of the instructions.   The patient was advised to call back or seek an in-person evaluation if the symptoms worsen or if the condition fails to improve as anticipated.  I provided 11 minutes of non-face-to-face time during this encounter.   Antony Blackbird, MD

## 2018-05-26 NOTE — Progress Notes (Signed)
Med refills  Trouble sleeping

## 2018-08-03 ENCOUNTER — Other Ambulatory Visit: Payer: Self-pay | Admitting: Family Medicine

## 2018-08-03 DIAGNOSIS — G4700 Insomnia, unspecified: Secondary | ICD-10-CM

## 2018-09-01 ENCOUNTER — Other Ambulatory Visit: Payer: Self-pay | Admitting: Family Medicine

## 2018-09-01 DIAGNOSIS — I10 Essential (primary) hypertension: Secondary | ICD-10-CM

## 2018-09-01 DIAGNOSIS — E785 Hyperlipidemia, unspecified: Secondary | ICD-10-CM

## 2018-09-25 ENCOUNTER — Other Ambulatory Visit: Payer: Self-pay | Admitting: Family Medicine

## 2018-09-25 DIAGNOSIS — I10 Essential (primary) hypertension: Secondary | ICD-10-CM

## 2018-09-25 DIAGNOSIS — E785 Hyperlipidemia, unspecified: Secondary | ICD-10-CM

## 2018-10-15 ENCOUNTER — Other Ambulatory Visit: Payer: Self-pay

## 2018-10-15 ENCOUNTER — Encounter: Payer: Self-pay | Admitting: Nurse Practitioner

## 2018-10-15 ENCOUNTER — Ambulatory Visit: Payer: BC Managed Care – PPO | Attending: Nurse Practitioner | Admitting: Nurse Practitioner

## 2018-10-15 DIAGNOSIS — G4700 Insomnia, unspecified: Secondary | ICD-10-CM | POA: Diagnosis not present

## 2018-10-15 DIAGNOSIS — E785 Hyperlipidemia, unspecified: Secondary | ICD-10-CM | POA: Diagnosis not present

## 2018-10-15 DIAGNOSIS — I10 Essential (primary) hypertension: Secondary | ICD-10-CM

## 2018-10-15 DIAGNOSIS — Z1211 Encounter for screening for malignant neoplasm of colon: Secondary | ICD-10-CM | POA: Diagnosis not present

## 2018-10-15 MED ORDER — HYDROCHLOROTHIAZIDE 25 MG PO TABS: 25.0000 mg | ORAL_TABLET | Freq: Every day | ORAL | 0 refills | Status: DC

## 2018-10-15 MED ORDER — ZOLPIDEM TARTRATE 5 MG PO TABS: 5.0000 mg | ORAL_TABLET | Freq: Every evening | ORAL | 0 refills | Status: DC | PRN

## 2018-10-15 MED ORDER — VERAPAMIL HCL ER 180 MG PO CP24: 180.0000 mg | ORAL_CAPSULE | Freq: Every day | ORAL | 0 refills | Status: DC

## 2018-10-15 MED ORDER — ROSUVASTATIN CALCIUM 10 MG PO TABS: 10.0000 mg | ORAL_TABLET | Freq: Every day | ORAL | 0 refills | Status: DC

## 2018-10-15 NOTE — Progress Notes (Signed)
Virtual Visit via Telephone Note Due to national recommendations of social distancing due to [place] 19, telehealth visit is felt to be most appropriate for this patient at this time.  I discussed the limitations, risks, security and privacy concerns of performing an evaluation and management service by telephone and the availability of in person appointments. I also discussed with the patient that there may be a patient responsible charge related to this service. The patient expressed understanding and agreed to proceed.    I connected with Andrea Wilkins on 10/15/18  at   9:50 AM EDT  EDT by telephone and verified that I am speaking with the correct person using two identifiers.   Consent I discussed the limitations, risks, security and privacy concerns of performing an evaluation and management service by telephone and the availability of in person appointments. I also discussed with the patient that there may be a patient responsible charge related to this service. The patient expressed understanding and agreed to proceed.   Location of Patient: Private  Residence    Location of Provider: [place] and [place] participating in Telemedicine visit: [name] FNP-BC [place]    History of Present Illness: Telemedicine visit for: Essential Hypertension  Mammogram scheduled for 12-15-2018 She was referred to GI today for colonoscopy.   She is requesting [place] today. I explained to her I will fill a one time courtesy refill however she will need to speak with her PCP regarding any additional refills thereafter. She verbalized understanding.   Monitoring blood pressure at home with average readings: 120/70-80s.  Denies chest pain, shortness of breath, palpitations, lightheadedness, dizziness, headaches or BLE edema. Taking HCTZ 25 mg daily as prescribed.  BP Readings from Last 3 Encounters:  10/23/17 (!) 151/95  10/03/16 132/83   08/22/16 121/81     Past Medical History:  Diagnosis Date  . Arthritis 2015  . Crohn's disease ([place])   . Hypertension Dx 2013    Past Surgical History:  Procedure Laterality Date  . ABDOMINAL HYSTERECTOMY  2008  . BREAST SURGERY  2007  . CESAREAN SECTION  1988  . SMALL INTESTINE SURGERY  2008    Family History  Problem Relation Age of Onset  . Hypertension Mother   . Diabetes Sister   . Hypertension Sister   . Diabetes Father     Social History   Socioeconomic History  . Marital status: Single    Spouse name: Not on file  . Number of children: Not on file  . Years of education: Not on file  . Highest education level: Not on file  Occupational History  . Not on file  Social Needs  . Financial resource strain: Not on file  . Food insecurity    Worry: Not on file    Inability: Not on file  . Transportation needs    Medical: Not on file    Non-medical: Not on file  Tobacco Use  . Smoking status: Never Smoker  . Smokeless tobacco: Never Used  Substance and Sexual Activity  . Alcohol use: No  . Drug use: No  . Sexual activity: Never    Birth control/protection: Surgical  Lifestyle  . Physical activity    Days per week: Not on file    Minutes per session: Not on file  . Stress: Not on file  Relationships  . Social Herbalist on phone: Not on file    Gets together: Not  on file    Attends religious service: Not on file    Active member of club or organization: Not on file    Attends meetings of clubs or organizations: Not on file    Relationship status: Not on file  Other Topics Concern  . Not on file  Social History Narrative  . Not on file     Observations/Objective: Awake, alert and oriented x 3   Review of Systems  Constitutional: Negative for fever, malaise/fatigue and weight loss.  HENT: Negative.  Negative for nosebleeds.   Eyes: Negative.  Negative for blurred vision, double vision and photophobia.  Respiratory: Negative.   Negative for cough and shortness of breath.   Cardiovascular: Negative.  Negative for chest pain, palpitations and leg swelling.  Gastrointestinal: Negative.  Negative for heartburn, nausea and vomiting.  Musculoskeletal: Positive for joint pain (OA). Negative for myalgias.  Neurological: Negative.  Negative for dizziness, focal weakness, seizures and headaches.  Psychiatric/Behavioral: Negative.  Negative for suicidal ideas.    Assessment and Plan: Andrea Wilkins was seen today for medication refill.  Diagnoses and all orders for this visit:  Essential hypertension -     hydrochlorothiazide (HYDRODIURIL) 25 MG tablet; Take 1 tablet (25 mg total) by mouth daily. Must have office visit for additional refills. -     verapamil (VERELAN PM) 180 MG 24 hr capsule; Take 1 capsule (180 mg total) by mouth at bedtime. Must have office visit for additional refills. Continue all antihypertensives as prescribed.  Remember to bring in your blood pressure log with you for your follow up appointment.  DASH/Mediterranean Diets are healthier choices for HTN.   Hyperlipidemia, unspecified hyperlipidemia type -     rosuvastatin (CRESTOR) 10 MG tablet; Take 1 tablet (10 mg total) by mouth daily. Must have office visit for additional refills. INSTRUCTIONS: Work on a low fat, heart healthy diet and participate in regular aerobic exercise program by working out at least 150 minutes per week; 5 days a week-30 minutes per day. Avoid red meat, fried foods. junk foods, sodas, sugary drinks, unhealthy snacking, alcohol and smoking.  Drink at least 48oz of water per day and monitor your carbohydrate intake daily.  Lab Results  Component Value Date   LDLCALC 184 (H) 10/23/2017      Follow Up Instructions Return for PCP for labs and ambien refill.     I discussed the assessment and treatment plan with the patient. The patient was provided an opportunity to ask questions and all were answered. The patient agreed with the plan  and demonstrated an understanding of the instructions.   The patient was advised to call back or seek an in-person evaluation if the symptoms worsen or if the condition fails to improve as anticipated.  I provided 16 minutes of non-face-to-face time during this encounter including median intraservice time, reviewing previous notes, labs, imaging, medications and explaining diagnosis and management.  Gildardo Pounds, FNP-BC

## 2018-10-25 ENCOUNTER — Other Ambulatory Visit: Payer: Self-pay | Admitting: Family Medicine

## 2018-10-25 DIAGNOSIS — G4700 Insomnia, unspecified: Secondary | ICD-10-CM

## 2018-10-25 NOTE — Telephone Encounter (Signed)
Please forward to PCP. Thanks

## 2018-11-05 ENCOUNTER — Ambulatory Visit: Payer: BC Managed Care – PPO | Attending: Family Medicine | Admitting: Family Medicine

## 2018-11-05 ENCOUNTER — Other Ambulatory Visit: Payer: Self-pay

## 2018-11-05 ENCOUNTER — Encounter: Payer: Self-pay | Admitting: Family Medicine

## 2018-11-05 DIAGNOSIS — I10 Essential (primary) hypertension: Secondary | ICD-10-CM

## 2018-11-05 DIAGNOSIS — E785 Hyperlipidemia, unspecified: Secondary | ICD-10-CM

## 2018-11-05 DIAGNOSIS — G4700 Insomnia, unspecified: Secondary | ICD-10-CM | POA: Diagnosis not present

## 2018-11-05 MED ORDER — ZOLPIDEM TARTRATE 5 MG PO TABS: 5.0000 mg | ORAL_TABLET | Freq: Every evening | ORAL | 5 refills | Status: DC | PRN

## 2018-11-05 MED ORDER — BACLOFEN 10 MG PO TABS: 10.0000 mg | ORAL_TABLET | Freq: Three times a day (TID) | ORAL | 4 refills | Status: DC

## 2018-11-05 MED ORDER — VERAPAMIL HCL ER 180 MG PO CP24: 180.0000 mg | ORAL_CAPSULE | Freq: Every day | ORAL | 1 refills | Status: DC

## 2018-11-05 MED ORDER — ROSUVASTATIN CALCIUM 10 MG PO TABS: 10.0000 mg | ORAL_TABLET | Freq: Every day | ORAL | 1 refills | Status: DC

## 2018-11-05 MED ORDER — HYDROCHLOROTHIAZIDE 25 MG PO TABS: 25.0000 mg | ORAL_TABLET | Freq: Every day | ORAL | 1 refills | Status: DC

## 2018-11-05 NOTE — Progress Notes (Signed)
Virtual Visit via Telephone Note  I connected with Andrea Wilkins on 11/05/18 at  3:30 PM EDT by telephone and verified that I am speaking with the correct person using two identifiers.   I discussed the limitations, risks, security and privacy concerns of performing an evaluation and management service by telephone and the availability of in person appointments. I also discussed with the patient that there may be a patient responsible charge related to this service. The patient expressed understanding and agreed to proceed.  Patient Location: Home Provider Location: Home Office Others participating in call: None    History of Present Illness:     53 year old female, who was last visit was 05/26/2018, who has seen in follow-up of chronic medical issues including hypertension, hyperlipidemia and issues with chronic insomnia.  Patient would like to have refill of Ambien to take as needed for her insomnia as she has been on this medication in the past.  She feels that her blood pressure is currently controlled on her current medications-verapamil and hydrochlorothiazide.  She denies any headaches or dizziness related to her blood pressure she denies any issues with chest pain or palpitations, no shortness of breath or cough and no swelling in her lower extremities.  She also needs refill of rosuvastatin for continued treatment of cholesterol.  She denies any increased muscle aches with the use of rosuvastatin.  She does have issues with chronic joint pain secondary to arthritis.  She feels that her Crohn's disease is stable at this time.  She denies any current issues with abdominal pain, constipation or diarrhea.   Past Medical History:  Diagnosis Date  . Arthritis 2015  . Crohn's disease ([place])   . Hypertension Dx 2013    Past Surgical History:  Procedure Laterality Date  . ABDOMINAL HYSTERECTOMY  2008  . BREAST SURGERY  2007  . CESAREAN SECTION  1988  . SMALL INTESTINE SURGERY  2008     Family History  Problem Relation Age of Onset  . Hypertension Mother   . Diabetes Sister   . Hypertension Sister   . Diabetes Father     Social History   Tobacco Use  . Smoking status: Never Smoker  . Smokeless tobacco: Never Used  Substance Use Topics  . Alcohol use: No  . Drug use: No     Allergies  Allergen Reactions  . Shellfish Allergy   . Benadryl Allergy  [Diphenhydramine Hcl (Sleep)] Cough       Observations/Objective: No vital signs or physical exam conducted as visit was done via telephone  Assessment and Plan: 1. Essential hypertension She reports that she feels her blood pressure is currently controlled on hydrochlorothiazide and verapamil and patient is provided with refills at today's visit.  She is to continue low-sodium diet and to continue to periodically monitor her blood pressure with goal blood pressure of 130/80 or less. - hydrochlorothiazide (HYDRODIURIL) 25 MG tablet; Take 1 tablet (25 mg total) by mouth daily.  Dispense: 90 tablet; Refill: 1 - verapamil (VERELAN PM) 180 MG 24 hr capsule; Take 1 capsule (180 mg total) by mouth at bedtime.  Dispense: 90 capsule; Refill: 1  2. Hyperlipidemia, unspecified hyperlipidemia type Continue use of rosuvastatin and patient will have fasting lipid panel and fasting comprehensive metabolic panel in follow-up of long-term use of medications at her next visit which will hopefully be in person. - rosuvastatin (CRESTOR) 10 MG tablet; Take 1 tablet (10 mg total) by mouth daily.  Dispense: 90 tablet; Refill: 1  3. Insomnia, unspecified type She has had long-term issues with insomnia.  She denies any snoring, morning headaches or daytime somnolence suggestive of sleep apnea per her report.  New prescription provided for Ambien 5 mg.  She is aware that she should not drive/operate machinery after taking this medication and that she should only take the medication when she has at least 8 hours to devote to sleep after taking  the medicine.  She is aware that she should not combine the use of Ambien with alcohol or other medications which may cause somnolence. - zolpidem (AMBIEN) 5 MG tablet; Take 1 tablet (5 mg total) by mouth at bedtime as needed. for sleep. Do not take on the nights that you take other medications for sleep  Dispense: 15 tablet; Refill: 5  -At the end of visit, patient also requested refill of baclofen to help with muscle stiffness related to her arthritis and medication was sent to patient's pharmacy.  Follow Up Instructions:Return in about 5 months (around 04/05/2019).    I discussed the assessment and treatment plan with the patient. The patient was provided an opportunity to ask questions and all were answered. The patient agreed with the plan and demonstrated an understanding of the instructions.   The patient was advised to call back or seek an in-person evaluation if the symptoms worsen or if the condition fails to improve as anticipated.  I provided 15  minutes of non-face-to-face time during this encounter.   Antony Blackbird, MD

## 2018-12-28 ENCOUNTER — Other Ambulatory Visit: Payer: Self-pay | Admitting: Family Medicine

## 2018-12-28 DIAGNOSIS — G4700 Insomnia, unspecified: Secondary | ICD-10-CM

## 2019-01-13 ENCOUNTER — Other Ambulatory Visit: Payer: Self-pay | Admitting: Family Medicine

## 2019-01-13 DIAGNOSIS — G4700 Insomnia, unspecified: Secondary | ICD-10-CM

## 2019-01-13 NOTE — Telephone Encounter (Signed)
Patient is requesting this medication. Please advise on refill or OV

## 2019-04-15 ENCOUNTER — Encounter: Payer: Self-pay | Admitting: Family Medicine

## 2019-04-18 ENCOUNTER — Telehealth: Payer: Self-pay | Admitting: Pharmacist

## 2019-04-18 ENCOUNTER — Other Ambulatory Visit: Payer: Self-pay

## 2019-04-18 ENCOUNTER — Ambulatory Visit: Payer: BC Managed Care – PPO | Attending: Family Medicine | Admitting: Family Medicine

## 2019-04-18 ENCOUNTER — Encounter: Payer: Self-pay | Admitting: Family Medicine

## 2019-04-18 DIAGNOSIS — I10 Essential (primary) hypertension: Secondary | ICD-10-CM

## 2019-04-18 DIAGNOSIS — Z79899 Other long term (current) drug therapy: Secondary | ICD-10-CM

## 2019-04-18 MED ORDER — VALSARTAN 160 MG PO TABS: 160.0000 mg | ORAL_TABLET | Freq: Every day | ORAL | 3 refills | Status: DC

## 2019-04-18 NOTE — Progress Notes (Signed)
bp follow up,  Last night was 165/101   160/95 March 10th  163/90 Mach 11 in the morning  153/92 March 11 1:44pm  156/95 Mach 11th at night

## 2019-04-18 NOTE — Progress Notes (Signed)
Virtual Visit via Telephone Note  I connected with Andrea Wilkins on 04/18/19 at 11:10 AM EDT by telephone and verified that I am speaking with the correct person using two identifiers.   I discussed the limitations, risks, security and privacy concerns of performing an evaluation and management service by telephone and the availability of in person appointments. I also discussed with the patient that there may be a patient responsible charge related to this service. The patient expressed understanding and agreed to proceed.  Patient Location: Home Provider Location: [facility] Office Others participating in call: none   History of Present Illness:         54 year old female with hypertension who is currently on verapamil and hydrochlorothiazide who reports that her blood pressure has been elevated.  She reports that she checked her blood pressure last night and it was 165/101 and when she checked her blood pressure a few minutes ago it was 154/93.  She also reports that she has been [facility] and her blood pressure last Thursday was 155/101.  She denies headaches or dizziness related to her blood pressure.  She is still taking both blood pressure medications daily.  She has had no chest pain or palpitations, no shortness of breath or cough and no lower extremity edema.  She denies any changes in her diet, no dietary supplements and she currently follows a low-sodium diet.  She has not started any new medications.   Past Medical History:  Diagnosis Date  . Arthritis 2015  . Crohn's disease ([place])   . Hypertension Dx 2013    Past Surgical History:  Procedure Laterality Date  . ABDOMINAL HYSTERECTOMY  2008  . BREAST SURGERY  2007  . CESAREAN SECTION  1988  . SMALL INTESTINE SURGERY  2008    Family History  Problem Relation Age of Onset  . Hypertension Mother   . Diabetes Sister   . Hypertension Sister   . Diabetes Father     Social History   Tobacco Use  .  Smoking status: Never Smoker  . Smokeless tobacco: Never Used  Substance Use Topics  . Alcohol use: No  . Drug use: No     Allergies  Allergen Reactions  . Shellfish Allergy   . Benadryl Allergy  [Diphenhydramine Hcl (Sleep)] Cough       Observations/Objective: No vital signs or physical exam conducted as visit was done via telephone  Assessment and Plan: 1. Essential hypertension; 2.  Encounter for long-term current use of medication She reports that she has had recent issues with elevated blood pressure without changes in diet or medications/supplements.  She will continue her current verapamil and hydrochlorothiazide and Diovan 160 mg once daily will also be added to her current regimen.  An appointment will be made for patient to follow-up with the clinical pharmacist in the next 1 to 2 weeks for blood pressure recheck and patient will also have BMP at that time in follow-up of change in medication to make sure that renal function and electrolytes are stable. - valsartan (DIOVAN) 160 MG tablet; Take 1 tablet (160 mg total) by mouth daily. To lower blood pressure  Dispense: 30 tablet; Refill: 3 - Basic Metabolic Panel; Future  Follow Up Instructions:Return in about 5 weeks (around 05/23/2019) for Chronic issues;hypertension-2-week follow-up with CPP.    I discussed the assessment and treatment plan with the patient. The patient was provided an opportunity to ask questions and all were answered. The patient  agreed with the plan and demonstrated an understanding of the instructions.   The patient was advised to call back or seek an in-person evaluation if the symptoms worsen or if the condition fails to improve as anticipated.  I provided 7 minutes of non-face-to-face time during this encounter.   Antony Blackbird, MD

## 2019-04-18 NOTE — Telephone Encounter (Signed)
Per patient's PCP, patient is to follow-up with me in 2 weeks for blood pressure check. Will forward to scheduling.

## 2019-05-09 ENCOUNTER — Ambulatory Visit: Payer: BC Managed Care – PPO | Admitting: Pharmacist

## 2019-05-16 ENCOUNTER — Ambulatory Visit: Payer: BC Managed Care – PPO | Attending: Family Medicine | Admitting: Pharmacist

## 2019-05-16 ENCOUNTER — Other Ambulatory Visit: Payer: Self-pay

## 2019-05-16 DIAGNOSIS — E785 Hyperlipidemia, unspecified: Secondary | ICD-10-CM

## 2019-05-16 DIAGNOSIS — I10 Essential (primary) hypertension: Secondary | ICD-10-CM | POA: Diagnosis not present

## 2019-05-16 DIAGNOSIS — Z79899 Other long term (current) drug therapy: Secondary | ICD-10-CM

## 2019-05-16 MED ORDER — LOSARTAN POTASSIUM 50 MG PO TABS: 50.0000 mg | ORAL_TABLET | Freq: Every day | ORAL | 0 refills | Status: DC

## 2019-05-16 NOTE — Progress Notes (Signed)
   S:    Patient arrives in good spirits. Presents to the clinic for hypertension evaluation, counseling, and management.  Patient was referred and last seen by Primary Care Provider on 04/18/2019. Valsartan added to regimen.   Of note, pt seen by [facility]) 05/05/2019. She reports that her provider stopped valsartan d/t rash/hives on her stomach and arms. When discussing this today, she tells me that she has experienced this reaction on her arms before starting valsartan. That provider started lisinopril 40 mg in place of valsartan. Today, patient endorses dry cough that started after taking lisinopril.   Patient reports adherence with medications.  Current BP Medications include:  HCTZ 25 mg daily, lisinopril 40 mg daily, verapamil 180 mg XR  Dietary habits include: reports compliance with salt restriction; drinks decaf coffee Exercise habits include: walks with her husband 3x/week Family / Social history:  - FHx: DM, HTN  - Never smoker - Denies drinking alcohol  O:  Vitals:   05/16/19 1651  BP: (!) 132/93     Home BP readings: 150s/80s  Last 3 Office BP readings: BP Readings from Last 3 Encounters:  05/16/19 (!) 132/93  10/23/17 (!) 151/95  10/03/16 132/83    BMET    Component Value Date/Time   NA 137 10/23/2017 1133   K 4.1 10/23/2017 1133   CL 98 10/23/2017 1133   CO2 25 10/23/2017 1133   GLUCOSE 81 10/23/2017 1133   GLUCOSE 83 03/14/2016 0925   BUN 10 10/23/2017 1133   CREATININE 0.73 10/23/2017 1133   CREATININE 0.87 03/14/2016 0925   CALCIUM 9.4 10/23/2017 1133   GFRNONAA 96 10/23/2017 1133   GFRNONAA 79 03/14/2016 0925   GFRAA 111 10/23/2017 1133   GFRAA >89 03/14/2016 0925    Renal function: CrCl cannot be calculated (Patient's most recent lab result is older than the maximum 21 days allowed.).  Clinical ASCVD: No  The 10-year ASCVD risk score Mikey Bussing DC Jr., et al., 2013) is: 2.1%   Values used to calculate the score:     Age: 54 years     Sex:  Female     Is Non-Hispanic African American: Yes     Diabetic: No     Tobacco smoker: No     Systolic Blood Pressure: 086 mmHg     Is BP treated: Yes     HDL Cholesterol: 87 mg/dL     Total Cholesterol: 179 mg/dL   A/P: Hypertension longstanding currently above goal on current medications. BP Goal = <130/80 mmHg. Patient endorses adherence to medications.   Will stop lisinopril d/t cough. Patient is unsure if her hives were related to valsartan. She reports having them on her arms previously before starting valsartan. She denies shortness of breath, tachycardia, throat itching after starting valsartan. After having a shared decision-making discussion with the patient, she wishes to try losartan. I have advised her to stop if she develops any symptoms of an allergic reaction.   -Stop lisinopril.  -Start losartan 50 mg daily.  -Continue verapamil, HCTZ -F/u labs ordered - CMP, lipid -Counseled on lifestyle modifications for blood pressure control including reduced dietary sodium, increased exercise, adequate sleep  Results reviewed and written information provided. Total time in face-to-face counseling 15 minutes.   F/U Clinic Visit in 1 week.    Benard Halsted, PharmD, [place] [phone]

## 2019-05-17 ENCOUNTER — Encounter: Payer: Self-pay | Admitting: Pharmacist

## 2019-05-17 LAB — LIPID PANEL
Chol/HDL Ratio: 2.4 ratio (ref 0.0–4.4)
Cholesterol, Total: 185 mg/dL (ref 100–199)
HDL: 77 mg/dL
LDL Chol Calc (NIH): 95 mg/dL (ref 0–99)
Triglycerides: 67 mg/dL (ref 0–149)
VLDL Cholesterol Cal: 13 mg/dL (ref 5–40)

## 2019-05-17 LAB — COMPREHENSIVE METABOLIC PANEL WITH GFR
ALT: 15 IU/L (ref 0–32)
AST: 21 IU/L (ref 0–40)
Albumin/Globulin Ratio: 2 (ref 1.2–2.2)
Albumin: 4.7 g/dL (ref 3.8–4.9)
Alkaline Phosphatase: 99 IU/L (ref 39–117)
BUN/Creatinine Ratio: 13 (ref 9–23)
BUN: 9 mg/dL (ref 6–24)
Bilirubin Total: 0.4 mg/dL (ref 0.0–1.2)
CO2: 21 mmol/L (ref 20–29)
Calcium: 9.5 mg/dL (ref 8.7–10.2)
Chloride: 100 mmol/L (ref 96–106)
Creatinine, Ser: 0.68 mg/dL (ref 0.57–1.00)
GFR calc Af Amer: 117 mL/min/1.73
GFR calc non Af Amer: 102 mL/min/1.73
Globulin, Total: 2.4 g/dL (ref 1.5–4.5)
Glucose: 81 mg/dL (ref 65–99)
Potassium: 4 mmol/L (ref 3.5–5.2)
Sodium: 138 mmol/L (ref 134–144)
Total Protein: 7.1 g/dL (ref 6.0–8.5)

## 2019-05-17 MED ORDER — VERAPAMIL HCL ER 180 MG PO CP24: 180.0000 mg | ORAL_CAPSULE | Freq: Every day | ORAL | 1 refills | Status: DC

## 2019-05-23 ENCOUNTER — Telehealth: Payer: Self-pay | Admitting: *Deleted

## 2019-05-23 ENCOUNTER — Other Ambulatory Visit: Payer: Self-pay

## 2019-05-23 ENCOUNTER — Ambulatory Visit: Payer: BC Managed Care – PPO | Attending: Family Medicine | Admitting: Pharmacist

## 2019-05-23 DIAGNOSIS — E785 Hyperlipidemia, unspecified: Secondary | ICD-10-CM | POA: Diagnosis not present

## 2019-05-23 MED ORDER — ROSUVASTATIN CALCIUM 10 MG PO TABS: 10.0000 mg | ORAL_TABLET | Freq: Every day | ORAL | 1 refills | Status: DC

## 2019-05-23 NOTE — Telephone Encounter (Signed)
Please ask patient if she intends to follow-up with this practice or [facility] or is she only seeing [facility] for weight management.

## 2019-05-23 NOTE — Progress Notes (Signed)
   S:    Patient arrives in good spirits. Presents to the clinic for hypertension evaluation, counseling, and management.  Patient was referred and last seen by Primary Care Provider on 04/18/2019. I saw her on 05/16/2019. Discontinued lisinopril due to cough. Losartan started instead.   Patient reports adherence with medications. No side effects with losartan.   Denies chest pain, dyspnea, HA or blurred vision. Denies LE edema.   Current BP Medications include:  HCTZ 25 mg daily, losartan 50 mg daily, verapamil 180 mg XR  Dietary habits include: reports compliance with salt restriction; drinks decaf coffee Exercise habits include: walks with her husband 3x/week Family / Social history:  - FHx: DM, HTN  - Never smoker - Denies drinking alcohol  O:  Vitals:   05/23/19 1545  BP: (!) 148/78     Home BP readings: 150s/80s  Last 3 Office BP readings: BP Readings from Last 3 Encounters:  05/23/19 (!) 148/78  05/16/19 (!) 132/93  10/23/17 (!) 151/95    BMET    Component Value Date/Time   NA 138 05/16/2019 1637   K 4.0 05/16/2019 1637   CL 100 05/16/2019 1637   CO2 21 05/16/2019 1637   GLUCOSE 81 05/16/2019 1637   GLUCOSE 83 03/14/2016 0925   BUN 9 05/16/2019 1637   CREATININE 0.68 05/16/2019 1637   CREATININE 0.87 03/14/2016 0925   CALCIUM 9.5 05/16/2019 1637   GFRNONAA 102 05/16/2019 1637   GFRNONAA 79 03/14/2016 0925   GFRAA 117 05/16/2019 1637   GFRAA >89 03/14/2016 0925    Renal function: CrCl cannot be calculated (Unknown ideal weight.).  Clinical ASCVD: No  The 10-year ASCVD risk score Mikey Bussing DC Jr., et al., 2013) is: 3.9%   Values used to calculate the score:     Age: 54 years     Sex: Female     Is Non-Hispanic African American: Yes     Diabetic: No     Tobacco smoker: No     Systolic Blood Pressure: 280 mmHg     Is BP treated: Yes     HDL Cholesterol: 77 mg/dL     Total Cholesterol: 185 mg/dL   A/P: Hypertension longstanding currently above goal on  current medications. BP Goal = <130/80 mmHg. Patient endorses adherence to medications. Wil plan for 1 month follow-up.  -Continue current medications.  -Counseled on lifestyle modifications for blood pressure control including reduced dietary sodium, increased exercise, adequate sleep  Results reviewed and written information provided. Total time in face-to-face counseling 15 minutes.   F/U Clinic Visit in 1 week.    Benard Halsted, PharmD, [place] [phone]

## 2019-05-23 NOTE — Telephone Encounter (Signed)
Patient calling asking if her crestor could get refilled. Per pt during her last visit she did not get it refills.

## 2019-05-23 NOTE — Telephone Encounter (Signed)
Per pt she is only seeing [facility] for weight management and would like to see if provider here can fill her crestor?

## 2019-05-25 ENCOUNTER — Other Ambulatory Visit: Payer: Self-pay | Admitting: Family Medicine

## 2019-05-25 DIAGNOSIS — E785 Hyperlipidemia, unspecified: Secondary | ICD-10-CM

## 2019-05-25 MED ORDER — ROSUVASTATIN CALCIUM 10 MG PO TABS: 10.0000 mg | ORAL_TABLET | Freq: Every day | ORAL | 1 refills | Status: DC

## 2019-05-25 NOTE — Telephone Encounter (Signed)
Informed patient with what provider stated and she verbalized understanding.

## 2019-05-25 NOTE — Progress Notes (Signed)
Patient ID: Andrea Wilkins, female   DOB: Sep 01, 1965, 54 y.o.   MRN: [id number]   Patient left phone message requesting refill of her cholesterol medication which will be sent to her pharmacy.

## 2019-05-25 NOTE — Telephone Encounter (Signed)
Please let patient know that her cholesterol medication was refilled to [facility]

## 2019-06-22 ENCOUNTER — Ambulatory Visit: Payer: BC Managed Care – PPO | Attending: Family Medicine | Admitting: Pharmacist

## 2019-06-22 ENCOUNTER — Other Ambulatory Visit: Payer: Self-pay

## 2019-06-22 VITALS — BP 137/84 | HR 81

## 2019-06-22 DIAGNOSIS — I10 Essential (primary) hypertension: Secondary | ICD-10-CM | POA: Diagnosis not present

## 2019-06-22 NOTE — Progress Notes (Signed)
   S:    Patient arrives in good spirits. Presents to the clinic for hypertension evaluation, counseling, and management.  Patient was referred and last seen by Primary Care Provider on 04/18/2019. I saw her on 05/23/2019. Patient reports adherence with medications. No side effects with losartan.   Denies chest pain, dyspnea, HA or blurred vision. Denies LE edema.   Current BP Medications include:  HCTZ 25 mg daily, losartan 50 mg daily, verapamil 180 mg XR  Dietary habits include: reports compliance with salt restriction; drinks decaf coffee Exercise habits include: walks with her husband 3x/week Family / Social history:  - FHx: DM, HTN  - Never smoker - Denies drinking alcohol  O:  Vitals:   06/22/19 1610  BP: 137/84  Pulse: 81     Home BP readings:  - Reports 130s/80s and sometimes lower   Last 3 Office BP readings: BP Readings from Last 3 Encounters:  06/22/19 137/84  05/23/19 (!) 148/78  05/16/19 (!) 132/93    BMET    Component Value Date/Time   NA 138 05/16/2019 1637   K 4.0 05/16/2019 1637   CL 100 05/16/2019 1637   CO2 21 05/16/2019 1637   GLUCOSE 81 05/16/2019 1637   GLUCOSE 83 03/14/2016 0925   BUN 9 05/16/2019 1637   CREATININE 0.68 05/16/2019 1637   CREATININE 0.87 03/14/2016 0925   CALCIUM 9.5 05/16/2019 1637   GFRNONAA 102 05/16/2019 1637   GFRNONAA 79 03/14/2016 0925   GFRAA 117 05/16/2019 1637   GFRAA >89 03/14/2016 0925    Renal function: CrCl cannot be calculated (Patient's most recent lab result is older than the maximum 21 days allowed.).  Clinical ASCVD: No  The 10-year ASCVD risk score Mikey Bussing DC Jr., et al., 2013) is: 2.9%   Values used to calculate the score:     Age: 50 years     Sex: Female     Is Non-Hispanic African American: Yes     Diabetic: No     Tobacco smoker: No     Systolic Blood Pressure: 116 mmHg     Is BP treated: Yes     HDL Cholesterol: 77 mg/dL     Total Cholesterol: 185 mg/dL   A/P: Hypertension longstanding  currently above goal on current medications but improved. BP Goal = <130/80 mmHg. Patient endorses adherence to medications. -Continue current medications.  -Counseled on lifestyle modifications for blood pressure control including reduced dietary sodium, increased exercise, adequate sleep  Results reviewed and written information provided. Total time in face-to-face counseling 15 minutes.   F/U Clinic Visit in 1 month with PCP.    Benard Halsted, PharmD, [place] [phone]

## 2019-06-23 ENCOUNTER — Encounter: Payer: Self-pay | Admitting: Pharmacist

## 2019-07-24 NOTE — Progress Notes (Signed)
Patient ID: Andrea Wilkins, female    DOB: 28-Sep-1965  MRN: [id number]  CC: Hypertension Follow-Up  Subjective: Andrea Wilkins is a 54 y.o. female with history of hypertension, asthma, Crohn disease, Chiari malformation type I, rheumatoid arthritis, tinea corporis, intertrigo, scaly patch rash, macular erythematous rash, iron deficiency anemia, occipital headache, and insomnia who presents for hypertension follow-up.  1. HYPERTENSION FOLLOW-UP: Currently taking: see medication list Have you taken your blood pressure medication today: [x]  Yes []  No  Med Adherence: [x]  Yes    []  No Medication side effects: []  Yes    [x]  No Adherence with salt restriction: [x]  Yes    []  No Exercise: Yes [x]  No []  Home Monitoring?: [x]  Yes    []  No Monitoring Frequency: []  Yes    [x]  No, usually check blood pressure when feeling light headed  Home BP results range: []  Yes    [x]  No  Smoking []  Yes [x]  No SOB? []  Yes    [x]  No Chest Pain?: []  Yes    [x]  No Leg swelling?: []  Yes    [x]  No Headaches?: []  Yes    [x]  No Dizziness? []  Yes    [x]  No Comments: Last visit 04/18/2019 with Dr. Chapman Fitch. During that encounter patient with elevated blood pressures and without changes in diet or medications/supplements. Continued on Verapamil and Hydrochlorothiazide. Diovan 160 mg once daily also added to regimen. Patient scheduled to follow-up with clinical pharmacist in 1 to 2 weeks for blood pressure recheck and repeat BMP at the same time.  Last visit 06/22/2019 with Dr. Daisy Blossom. During that encounter patient continued on current regimen and scheduled to follow-up with primary physician in 1 month.  2. RASH: Duration:  months, 4  Location: chest and arms, back and stomach sometimes Itching: yes Burning: yes Redness: yes Oozing: no Scaling: no Blisters: no Painful: no Fevers: no Change in detergents/soaps/personal care products: no Recent illness: no Recent travel:no History of same: no  Context: stable Alleviating factors: none Treatments attempted:Calamine lotion, benadryl and lotion/moisturizer, helped some Shortness of breath: no  Throat/tongue swelling: no Myalgias/arthralgias: no   Patient Active Problem List   Diagnosis Date Noted  . Extensor tenosynovitis of left wrist 06/13/2016  . Ganglion cyst of wrist, left 06/13/2016  . Obesity, morbid, BMI 40.0-49.9 ([place]) 03/14/2016  . Left leg pain 06/08/2015  . Intertrigo 04/02/2015  . Scaly patch rash 04/02/2015  . Macular erythematous rash 04/02/2015  . Tinea corporis 03/05/2015  . Tinea pedis 03/05/2015  . Insomnia 03/05/2015  . Abnormal CT scan, head 12/15/2014  . Chiari malformation type I ([place]) 12/15/2014  . Occipital headache 12/15/2014  . Radicular pain in left arm 12/15/2014  . IDA (iron deficiency anemia) 10/27/2013  . Rheumatoid arthritis ([place]) 10/27/2013  . Crohn disease ([place]) 10/23/2012  . Asthma 10/23/2012  . Tension headache 06/14/2012  . Hypertension 06/14/2012     Current Outpatient Medications on File Prior to Visit  Medication Sig Dispense Refill  . baclofen (LIORESAL) 10 MG tablet Take 1 tablet (10 mg total) by mouth 3 (three) times daily. (Patient taking differently: Take 10 mg by mouth 3 (three) times daily as needed. ) 30 each 4  . hydrochlorothiazide (HYDRODIURIL) 25 MG tablet Take 1 tablet (25 mg total) by mouth daily. 90 tablet 1  . losartan (COZAAR) 50 MG tablet Take 1 tablet (50 mg total) by mouth daily. 90 tablet 0  . rosuvastatin (CRESTOR) 10 MG tablet Take 1 tablet (10 mg total) by mouth daily.  90 tablet 1  . verapamil (VERELAN PM) 180 MG 24 hr capsule Take 1 capsule (180 mg total) by mouth at bedtime. 90 capsule 1  . zolpidem (AMBIEN) 5 MG tablet Take 1 tablet (5 mg total) by mouth at bedtime as needed. for sleep. Do not take on the nights that you take other medications for sleep 15 tablet 5   No current facility-administered medications on file prior to visit.    Allergies   Allergen Reactions  . Lisinopril Cough  . Shellfish Allergy   . Valsartan Hives  . Benadryl Allergy  [Diphenhydramine Hcl (Sleep)] Cough    Social History   Socioeconomic History  . Marital status: Single    Spouse name: Not on file  . Number of children: Not on file  . Years of education: Not on file  . Highest education level: Not on file  Occupational History  . Not on file  Tobacco Use  . Smoking status: Never Smoker  . Smokeless tobacco: Never Used  Vaping Use  . Vaping Use: Never used  Substance and Sexual Activity  . Alcohol use: No  . Drug use: No  . Sexual activity: Never    Birth control/protection: Surgical  Other Topics Concern  . Not on file  Social History Narrative  . Not on file   Social Determinants of Health   Financial Resource Strain:   . Difficulty of Paying Living Expenses:   Food Insecurity:   . Worried About Charity fundraiser in the Last Year:   . Arboriculturist in the Last Year:   Transportation Needs:   . Film/video editor (Medical):   Marland Kitchen Lack of Transportation (Non-Medical):   Physical Activity:   . Days of Exercise per Week:   . Minutes of Exercise per Session:   Stress:   . Feeling of Stress :   Social Connections:   . Frequency of Communication with Friends and Family:   . Frequency of Social Gatherings with Friends and Family:   . Attends Religious Services:   . Active Member of Clubs or Organizations:   . Attends Archivist Meetings:   Marland Kitchen Marital Status:   Intimate Partner Violence:   . Fear of Current or Ex-Partner:   . Emotionally Abused:   Marland Kitchen Physically Abused:   . Sexually Abused:     Family History  Problem Relation Age of Onset  . Hypertension Mother   . Diabetes Sister   . Hypertension Sister   . Diabetes Father     Past Surgical History:  Procedure Laterality Date  . ABDOMINAL HYSTERECTOMY  2008  . BREAST SURGERY  2007  . CESAREAN SECTION  1988  . SMALL INTESTINE SURGERY  2008    ROS:  Review of Systems Negative except as stated above  PHYSICAL EXAM: Vitals with BMI 07/25/2019 06/22/2019 05/23/2019  Height 5' 3"  - -  Weight 237 lbs 13 oz - -  BMI 46.50 - -  Systolic 354 656 812  Diastolic 83 84 78  Pulse 87 81 -  SpO2- 97%, room air  Physical Exam  General appearance - alert, well appearing, and in no distress and oriented to person, place, and time Mental status - alert, oriented to person, place, and time, normal mood, behavior, speech, dress, motor activity, and thought processes Neck - supple, no significant adenopathy Lymphatics - no palpable lymphadenopathy, no hepatosplenomegaly Chest - clear to auscultation, no wheezes, rales or rhonchi, symmetric air entry, no tachypnea, retractions  or cyanosis Heart - normal rate, regular rhythm, normal S1, S2, no murmurs, rubs, clicks or gallops Neurological - alert, oriented, normal speech, no focal findings or movement disorder noted, neck supple without rigidity, cranial nerves II through XII intact, motor and sensory grossly normal bilaterally, normal muscle tone, no tremors, strength 5/5, Romberg sign negative, normal gait and station Musculoskeletal - no joint tenderness, deformity or swelling, no muscular tenderness noted, full range of motion without pain Extremities - peripheral pulses normal, no pedal edema, no clubbing or cyanosis Skin - normal coloration and turgor,no suspicious skin lesions noted, erythematous macular rash on anterior/posterior left forearm and anterior/posterior upper arm without break in skin, without drainage, and without edema  ASSESSMENT AND PLAN: 1. Essential hypertension: -Blood pressure controlled at today's visit.  -Continue Hydrochlorothiazide, Losartan, and Verapamil as prescribed. Refills are available on file.  -Counseled on blood pressure goal of less than 130/80, low-sodium, DASH diet, medication compliance, 150 minutes of moderate intensity exercise per week as tolerated.  Discussed medication compliance, adverse effects. -Last CMP and lipid panel obtained 05/16/2019 and unremarkable.  -Follow-up with primary physician in 3 months or sooner if needed.  2. Rash and nonspecific skin eruption: -Patient with pruritic erythematous macular rash on left anterior/posterior forearm and left anterior/posterior upper arm. No evidence of breaks in skin, drainage, and edema. Patient reports rash was worse last night. Patient has pictures on her mobile device of several pictures of rashes on different areas of her body which ranges from arms, chest, back and stomach. Reports at one time rashes became so intense that she was treated at the urgent care and received injections for them which helped. -Reports rashes have been occurring over the course of the last 4 months stating they come and go. -Reports she has taken Benadryl and used Calamine lotion without much relief. -Will try course of Triamcinolone cream to be used twice daily to see if this helps with rash. -[facility]. Patient does have history of allergies to Lisinopril, shellfish, Valsartan, and Benadryl. Patient would like additional testing. - triamcinolone cream (KENALOG) 0.1 %; Apply 1 application topically 2 (two) times daily.  Dispense: 100 g; Refill: 0 - Ambulatory referral to Allergy    Patient was given the opportunity to ask questions.  Patient verbalized understanding of the plan and was able to repeat key elements of the plan. Patient was given clear instructions to go to Emergency Department or return to medical center if symptoms don't improve, worsen, or new problems develop.The patient verbalized understanding.   No orders of the defined types were placed in this encounter.    Requested Prescriptions    No prescriptions requested or ordered in this encounter    No follow-ups on file.  Camillia Herter, NP

## 2019-07-25 ENCOUNTER — Ambulatory Visit: Payer: BC Managed Care – PPO | Attending: Family Medicine | Admitting: Family

## 2019-07-25 ENCOUNTER — Ambulatory Visit: Payer: BC Managed Care – PPO | Admitting: Family Medicine

## 2019-07-25 ENCOUNTER — Other Ambulatory Visit: Payer: Self-pay

## 2019-07-25 VITALS — BP 133/83 | HR 87 | Ht 63.0 in | Wt 237.8 lb

## 2019-07-25 DIAGNOSIS — R21 Rash and other nonspecific skin eruption: Secondary | ICD-10-CM | POA: Diagnosis not present

## 2019-07-25 DIAGNOSIS — I10 Essential (primary) hypertension: Secondary | ICD-10-CM

## 2019-07-25 MED ORDER — TRIAMCINOLONE ACETONIDE 0.1 % EX CREA: 1.0000 "application " | TOPICAL_CREAM | Freq: Two times a day (BID) | CUTANEOUS | 0 refills | Status: DC

## 2019-07-25 NOTE — Patient Instructions (Addendum)
Traimcinolone for rash. Referral to [facility] for rash. Continue Verapamil, Hydrochlorothiazide, and Diovan for blood pressure medications as prescribed. Follow-up with primary physician in 4 months or sooner if needed for management of chronic conditions.  Rash, Adult  A rash is a change in the color of your skin. A rash can also change the way your skin feels. There are many different conditions and factors that can cause a rash. Follow these instructions at home: The goal of treatment is to stop the itching and keep the rash from spreading. Watch for any changes in your symptoms. Let your doctor know about them. Follow these instructions to help with your condition: Medicine Take or apply over-the-counter and prescription medicines only as told by your doctor. These may include medicines:  To treat red or swollen skin (corticosteroid creams).  To treat itching.  To treat an allergy (oral antihistamines).  To treat very bad symptoms (oral corticosteroids).  Skin care  Put cool cloths (compresses) on the affected areas.  Do not scratch or rub your skin.  Avoid covering the rash. Make sure that the rash is exposed to air as much as possible. Managing itching and discomfort  Avoid hot showers or baths. These can make itching worse. A cold shower may help.  Try taking a bath with: ? Epsom salts. You can get these at your local pharmacy or grocery store. Follow the instructions on the package. ? Baking soda. Pour a small amount into the bath as told by your doctor. ? Colloidal oatmeal. You can get this at your local pharmacy or grocery store. Follow the instructions on the package.  Try putting baking soda paste onto your skin. Stir water into baking soda until it gets like a paste.  Try putting on a lotion that relieves itchiness (calamine lotion).  Keep cool and out of the sun. Sweating and being hot can make itching worse. General instructions   Rest as  needed.  Drink enough fluid to keep your pee (urine) pale yellow.  Wear loose-fitting clothing.  Avoid scented soaps, detergents, and perfumes. Use gentle soaps, detergents, perfumes, and other cosmetic products.  Avoid anything that causes your rash. Keep a journal to help track what causes your rash. Write down: ? What you eat. ? What cosmetic products you use. ? What you drink. ? What you wear. This includes jewelry.  Keep all follow-up visits as told by your doctor. This is important. Contact a doctor if:  You sweat at night.  You lose weight.  You pee (urinate) more than normal.  You pee less than normal, or you notice that your pee is a darker color than normal.  You feel weak.  You throw up (vomit).  Your skin or the whites of your eyes look yellow (jaundice).  Your skin: ? Tingles. ? Is numb.  Your rash: ? Does not go away after a few days. ? Gets worse.  You are: ? More thirsty than normal. ? More tired than normal.  You have: ? New symptoms. ? Pain in your belly (abdomen). ? A fever. ? Watery poop (diarrhea). Get help right away if:  You have a fever and your symptoms suddenly get worse.  You start to feel mixed up (confused).  You have a very bad headache or a stiff neck.  You have very bad joint pains or stiffness.  You have jerky movements that you cannot control (seizure).  Your rash covers all or most of your body. The rash  may or may not be painful.  You have blisters that: ? Are on top of the rash. ? Grow larger. ? Grow together. ? Are painful. ? Are inside your nose or mouth.  You have a rash that: ? Looks like purple pinprick-sized spots all over your body. ? Has a "bull's eye" or looks like a target. ? Is red and painful, causes your skin to peel, and is not from being in the sun too long. Summary  A rash is a change in the color of your skin. A rash can also change the way your skin feels.  The goal of treatment is to  stop the itching and keep the rash from spreading.  Take or apply over-the-counter and prescription medicines only as told by your doctor.  Contact a doctor if you have new symptoms or symptoms that get worse.  Keep all follow-up visits as told by your doctor. This is important. This information is not intended to replace advice given to you by your health care provider. Make sure you discuss any questions you have with your health care provider. Document Revised: 05/07/2018 Document Reviewed: 08/17/2017 Elsevier Patient Education  [place].

## 2019-08-03 ENCOUNTER — Encounter: Payer: Self-pay | Admitting: Internal Medicine

## 2019-08-03 DIAGNOSIS — L4050 Arthropathic psoriasis, unspecified: Secondary | ICD-10-CM | POA: Insufficient documentation

## 2019-08-06 ENCOUNTER — Other Ambulatory Visit: Payer: Self-pay | Admitting: Family Medicine

## 2019-08-06 NOTE — Telephone Encounter (Signed)
Requested Prescriptions  Pending Prescriptions Disp Refills  . losartan (COZAAR) 50 MG tablet [Pharmacy Med Name: LOSARTAN POTASSIUM 50 MG TAB] 90 tablet 1    Sig: TAKE 1 TABLET BY MOUTH EVERY DAY     Cardiovascular:  Angiotensin Receptor Blockers Passed - 08/06/2019  8:36 AM      Passed - Cr in normal range and within 180 days    Creat  Date Value Ref Range Status  03/14/2016 0.87 0.50 - 1.10 mg/dL Final   Creatinine, Ser  Date Value Ref Range Status  05/16/2019 0.68 0.57 - 1.00 mg/dL Final         Passed - K in normal range and within 180 days    Potassium  Date Value Ref Range Status  05/16/2019 4.0 3.5 - 5.2 mmol/L Final         Passed - Patient is not pregnant      Passed - Last BP in normal range    BP Readings from Last 1 Encounters:  07/25/19 133/83         Passed - Valid encounter within last 6 months    Recent Outpatient Visits          1 week ago Essential hypertension   [place], [place], NP   1 month ago Essential hypertension   [place], Annie Main L, RPH-CPP   2 months ago Hyperlipidemia, unspecified hyperlipidemia type   [place], RPH-CPP   2 months ago Essential hypertension   [place], Jarome [place], RPH-CPP   3 months ago Essential hypertension   [facility] [place], [place], MD

## 2019-09-16 ENCOUNTER — Other Ambulatory Visit: Payer: Self-pay

## 2019-09-16 ENCOUNTER — Ambulatory Visit (INDEPENDENT_AMBULATORY_CARE_PROVIDER_SITE_OTHER): Payer: BC Managed Care – PPO | Admitting: Allergy

## 2019-09-16 ENCOUNTER — Encounter: Payer: Self-pay | Admitting: Allergy

## 2019-09-16 VITALS — BP 132/88 | HR 85 | Temp 97.7°F | Resp 16 | Ht 63.0 in | Wt 239.0 lb

## 2019-09-16 DIAGNOSIS — L508 Other urticaria: Secondary | ICD-10-CM | POA: Diagnosis not present

## 2019-09-16 NOTE — Progress Notes (Signed)
New Patient Note  RE: Andrea Wilkins MRN: [id number] DOB: February 11, 1965 Date of Office Visit: 09/16/2019  Referring provider: Camillia Herter, NP Primary care provider: Antony Blackbird, MD  Chief Complaint: hives  History of present illness: Andrea Wilkins is a 54 y.o. female presenting today for consultation for hives.   She has been having hives daily for the past 5 months.  She states she had a large welt on her abdomen initially and she went to UC and was given a steroid injection which did help.  Later on she started getting smaller welts on her arms, legs, thighs, chest, back, face.  States hives occur daily.  The hives are itchy.  The hives are usually resolved within 24 hours.  No associated swelling. She does have marks/bruising left behind.  No joint aches/pains.  No fevers.  No change in body products/lotions/detergents/soaps.  No preceding illnesses.  No change in medications or foods.  No bites or stings. Triamcinolone has been prescribed that she uses when she has an outbreak but doesn't help much.  She takes benadryl at night as she typically breaks out in the evening/night primarily.  Benadryl calms the itching so she can sleep.  She will use calamine lotion as well.  She does eat red meat in the diet.    She does have history of Crohn's.  She does report having eczema and uses vaseline which help.  She states had childhood asthma but outgrew by 6th grade.   She does report having seasonal allergies that started when she moved to [place] area about 10 years ago.  She reports watery, red eyes initially but has not been an issue lately.  She has used OTC allergy medications.   Review of systems: Review of Systems  Constitutional: Negative.   HENT: Negative.   Eyes: Negative.   Respiratory: Negative.   Cardiovascular: Negative.   Gastrointestinal: Negative.   Musculoskeletal: Negative.   Skin: Positive for itching and rash.  Neurological: Negative.     All other  systems negative unless noted above in HPI  Past medical history: Past Medical History:  Diagnosis Date  . Arthritis 2015  . Crohn's disease ([place])   . Hypertension Dx 2013  . Urticaria     Past surgical history: Past Surgical History:  Procedure Laterality Date  . ABDOMINAL HYSTERECTOMY  2008  . BREAST SURGERY  2007  . CESAREAN SECTION  1988  . SMALL INTESTINE SURGERY  2008    Family history:  Family History  Problem Relation Age of Onset  . Hypertension Mother   . Diabetes Sister   . Hypertension Sister   . Diabetes Father     Social history: She lives in a townhome with carpeting with electric heating and central cooling.  No pets in the home.  There is no concern for water damage, mildew or roaches in the home.  She is an Sport and exercise psychologist.  She denies a smoking history.  Medication List: Current Outpatient Medications  Medication Sig Dispense Refill  . hydrochlorothiazide (HYDRODIURIL) 25 MG tablet Take 1 tablet (25 mg total) by mouth daily. 90 tablet 1  . losartan (COZAAR) 50 MG tablet TAKE 1 TABLET BY MOUTH EVERY DAY 90 tablet 1  . rosuvastatin (CRESTOR) 10 MG tablet Take 1 tablet (10 mg total) by mouth daily. 90 tablet 1  . triamcinolone cream (KENALOG) 0.1 % Apply 1 application topically 2 (two) times daily. 100 g 0  . verapamil (VERELAN PM) 180 MG 24 hr  capsule Take 1 capsule (180 mg total) by mouth at bedtime. 90 capsule 1  . zolpidem (AMBIEN) 5 MG tablet Take 1 tablet (5 mg total) by mouth at bedtime as needed. for sleep. Do not take on the nights that you take other medications for sleep 15 tablet 5  . baclofen (LIORESAL) 10 MG tablet Take 1 tablet (10 mg total) by mouth 3 (three) times daily. (Patient not taking: Reported on 09/16/2019) 30 each 4   No current facility-administered medications for this visit.    Known medication allergies: Allergies  Allergen Reactions  . Lisinopril Cough  . Shellfish Allergy   . Valsartan Hives  . Benadryl  Allergy  [Diphenhydramine Hcl (Sleep)] Cough     Physical examination: Blood pressure 132/88, pulse 85, temperature 97.7 F (36.5 C), temperature source Temporal, resp. rate 16, height 5' 3"  (1.6 m), weight 239 lb (108.4 kg), SpO2 97 %.  General: Alert, interactive, in no acute distress. HEENT: PERRLA, TMs pearly gray, turbinates non-edematous without discharge, post-pharynx non erythematous. Neck: Supple without lymphadenopathy. Lungs: Clear to auscultation without wheezing, rhonchi or rales. {no increased work of breathing. CV: Normal S1, S2 without murmurs. Abdomen: Nondistended, nontender. Skin: Warm and dry, without lesions or rashes. Extremities:  No clubbing, cyanosis or edema. Neuro:   Grossly intact.  Diagnositics/Labs: None today  Assessment and plan:   Chronic urticaria  - at this time etiology of hives and swelling is unknown.  Hives can be caused by a variety of different triggers including illness/infection, foods, medications, stings, exercise, pressure, vibrations, extremes of temperature to name a few however majority of the time there is no identifiable trigger.  Your symptoms have been ongoing for >6 weeks making this chronic thus will obtain labwork to evaluate: CBC w diff, CMP, tryptase, hive panel, environmental panel, alpha-gal panel, inflammatory markers  - for management of hives recommend the following high-dose antihistamine regimen: Zyrtec 64m 1 tab twice a day, Pepcid 212m1 tab twice a day and Singulair 1037m tab at bedtime.  If you notice any change in mood/behavior/sleep after starting Singulair then stop this medication and let us [place].  Symptoms resolve after stopping the medication.    - if the high-dose antihistamine regimen is not effective enough in controlling hives then next step would be to initiate Xolair monthly injections.  Xolair is effective in managing chronic spontaneous hives.  We will discuss this further if needed  - should significant  symptoms recur or new symptoms occur, a journal is to be kept recording any foods eaten, beverages consumed, medications taken, activities performed, and environmental conditions within a 6 hour time period prior to the onset of symptoms.   Follow-up in 2-3 months or sooner if needed  I appreciate the opportunity to take part in [facility]. Please do not hesitate to contact me with questions.  Sincerely,   ShaPrudy FeelerD Allergy/Immunology [facility] [place]

## 2019-09-16 NOTE — Patient Instructions (Addendum)
Chronic hives  - at this time etiology of hives and swelling is unknown.  Hives can be caused by a variety of different triggers including illness/infection, foods, medications, stings, exercise, pressure, vibrations, extremes of temperature to name a few however majority of the time there is no identifiable trigger.  Your symptoms have been ongoing for >6 weeks making this chronic thus will obtain labwork to evaluate: CBC w diff, CMP, tryptase, hive panel, environmental panel, alpha-gal panel, inflammatory markers  - for management of hives recommend the following high-dose antihistamine regimen: Zyrtec 88m 1 tab twice a day, Pepcid 266m1 tab twice a day and Singulair 1037m tab at bedtime.  If you notice any change in mood/behavior/sleep after starting Singulair then stop this medication and let us [place].  Symptoms resolve after stopping the medication.    - if the high-dose antihistamine regimen is not effective enough in controlling hives then next step would be to initiate Xolair monthly injections.  Xolair is effective in managing chronic spontaneous hives.  We will discuss this further if needed  - should significant symptoms recur or new symptoms occur, a journal is to be kept recording any foods eaten, beverages consumed, medications taken, activities performed, and environmental conditions within a 6 hour time period prior to the onset of symptoms.   Follow-up in 2-3 months or sooner if needed

## 2019-09-22 ENCOUNTER — Telehealth: Payer: Self-pay | Admitting: Allergy

## 2019-09-22 ENCOUNTER — Other Ambulatory Visit: Payer: Self-pay | Admitting: *Deleted

## 2019-09-22 MED ORDER — MONTELUKAST SODIUM 10 MG PO TABS: 10.0000 mg | ORAL_TABLET | Freq: Every day | ORAL | 5 refills | Status: DC

## 2019-09-22 NOTE — Telephone Encounter (Signed)
Called patient and confirmed which pharmacy she wanted the Singulair to be sent to. Prescription has been sent in to requested pharmacy. Advised patient and also advised to start taking all medications Dr. Nelva Bush has recommended to help with her symptoms and we will give her a call when her lab results are in. Patient verbalized understanding.

## 2019-09-22 NOTE — Telephone Encounter (Signed)
Patient called and states she was directed to take three medications, she has picked up Zyrtec and Pepcid, but Singulair was not called in to the pharmacy. Patient has not started taking any medications, because she did not know if she should wait until her test results come back.   Please advise.

## 2019-09-24 LAB — COMPREHENSIVE METABOLIC PANEL
ALT: 15 IU/L (ref 0–32)
AST: 22 IU/L (ref 0–40)
Albumin/Globulin Ratio: 1.9 (ref 1.2–2.2)
Albumin: 4.7 g/dL (ref 3.8–4.9)
Alkaline Phosphatase: 105 IU/L (ref 48–121)
BUN/Creatinine Ratio: 18 (ref 9–23)
BUN: 12 mg/dL (ref 6–24)
Bilirubin Total: 0.3 mg/dL (ref 0.0–1.2)
CO2: 25 mmol/L (ref 20–29)
Calcium: 9.5 mg/dL (ref 8.7–10.2)
Chloride: 102 mmol/L (ref 96–106)
Creatinine, Ser: 0.68 mg/dL (ref 0.57–1.00)
GFR calc Af Amer: 116 mL/min/{1.73_m2} (ref >59)
GFR calc non Af Amer: 101 mL/min/{1.73_m2} (ref >59)
Globulin, Total: 2.5 g/dL (ref 1.5–4.5)
Glucose: 90 mg/dL (ref 65–99)
Potassium: 4.7 mmol/L (ref 3.5–5.2)
Sodium: 140 mmol/L (ref 134–144)
Total Protein: 7.2 g/dL (ref 6.0–8.5)

## 2019-09-24 LAB — CBC WITH DIFFERENTIAL
Basophils Absolute: 0 10*3/uL (ref 0.0–0.2)
Basos: 1 %
EOS (ABSOLUTE): 0.2 10*3/uL (ref 0.0–0.4)
Eos: 4 %
Hematocrit: 36.9 % (ref 34.0–46.6)
Hemoglobin: 12.2 g/dL (ref 11.1–15.9)
Immature Grans (Abs): 0 10*3/uL (ref 0.0–0.1)
Immature Granulocytes: 0 %
Lymphocytes Absolute: 1.5 10*3/uL (ref 0.7–3.1)
Lymphs: 38 %
MCH: 29.3 pg (ref 26.6–33.0)
MCHC: 33.1 g/dL (ref 31.5–35.7)
MCV: 89 fL (ref 79–97)
Monocytes Absolute: 0.3 10*3/uL (ref 0.1–0.9)
Monocytes: 9 %
Neutrophils Absolute: 1.9 10*3/uL (ref 1.4–7.0)
Neutrophils: 48 %
RBC: 4.16 x10E6/uL (ref 3.77–5.28)
RDW: 12.9 % (ref 11.7–15.4)
WBC: 3.9 10*3/uL (ref 3.4–10.8)

## 2019-09-24 LAB — ALLERGENS W/TOTAL IGE AREA 2

## 2019-09-24 LAB — ALPHA-GAL PANEL
Alpha Gal IgE*: <0.1 kU/L (ref <0.10)
Beef (Bos spp) IgE: <0.1 kU/L (ref <0.35)
Class Interpretation: 0
Class Interpretation: 0
Class Interpretation: 0
Lamb/Mutton (Ovis spp) IgE: <0.1 kU/L (ref <0.35)
Pork (Sus spp) IgE: <0.1 kU/L (ref <0.35)

## 2019-09-24 LAB — THYROID ANTIBODIES
Thyroglobulin Antibody: <1 IU/mL (ref 0.0–0.9)
Thyroperoxidase Ab SerPl-aCnc: <8 IU/mL (ref 0–34)

## 2019-09-24 LAB — TRYPTASE: Tryptase: 5.4 ug/L (ref 2.2–13.2)

## 2019-09-24 LAB — SEDIMENTATION RATE: Sed Rate: 43 mm/hr — ABNORMAL HIGH (ref 0–40)

## 2019-09-24 LAB — ANA W/REFLEX: Anti Nuclear Antibody (ANA): NEGATIVE

## 2019-09-24 LAB — CHRONIC URTICARIA: cu index: 34.2 — ABNORMAL HIGH (ref <10)

## 2019-10-23 ENCOUNTER — Other Ambulatory Visit: Payer: Self-pay | Admitting: Family Medicine

## 2019-10-23 DIAGNOSIS — G4700 Insomnia, unspecified: Secondary | ICD-10-CM

## 2019-10-23 NOTE — Telephone Encounter (Signed)
Requested medication (s) are due for refill today: yes  Requested medication (s) are on the active medication list: yes  Last refill:  11/05/18  Future visit scheduled: no  Notes to clinic:  med not delegated to NT to RF   Requested Prescriptions  Pending Prescriptions Disp Refills   zolpidem (AMBIEN) 5 MG tablet [Pharmacy Med Name: ZOLPIDEM TARTRATE 5 MG TABLET] 15 tablet     Sig: TAKE 1 TABLET AT BEDTIME AS NEEDED FOR SLEEP DO NOT TAKE ON THE NIGHTS YOU TAKE OTHER MEDS FOR SLEEP      Not Delegated - Psychiatry:  Anxiolytics/Hypnotics Failed - 10/23/2019  4:14 PM      Failed - This refill cannot be delegated      Failed - Urine Drug Screen completed in last 360 days.      Passed - Valid encounter within last 6 months    Recent Outpatient Visits           3 months ago Essential hypertension   [place], [place], NP   4 months ago Essential hypertension   [place], Jarome Matin, RPH-CPP   5 months ago Hyperlipidemia, unspecified hyperlipidemia type   [place], Jarome Matin, RPH-CPP   5 months ago Essential hypertension   [place], Stephen L, RPH-CPP   6 months ago Essential hypertension   [place], MD       Future Appointments             In 1 month Padgett, Rae Halsted, MD [facility] and [place]

## 2019-11-14 ENCOUNTER — Other Ambulatory Visit: Payer: Self-pay | Admitting: Family Medicine

## 2019-11-14 DIAGNOSIS — I10 Essential (primary) hypertension: Secondary | ICD-10-CM

## 2019-11-30 ENCOUNTER — Ambulatory Visit: Payer: BC Managed Care – PPO | Admitting: Allergy

## 2019-11-30 ENCOUNTER — Encounter: Payer: Self-pay | Admitting: Allergy

## 2019-11-30 ENCOUNTER — Other Ambulatory Visit: Payer: Self-pay

## 2019-11-30 VITALS — BP 130/78 | HR 70 | Resp 16

## 2019-11-30 DIAGNOSIS — L508 Other urticaria: Secondary | ICD-10-CM | POA: Diagnosis not present

## 2019-11-30 NOTE — Progress Notes (Signed)
Follow-up Note  RE: Chantell Kunkler MRN: [id number] DOB: 04/21/1965 Date of Office Visit: 11/30/2019   History of present illness: Andrea Wilkins is a 54 y.o. female presenting today for follow-up of chronic urticaria.  She was last seen in the office on 09/16/2018 myself.  She has not had any major health changes, surgeries or hospitalizations since her last visit.  She states her hives have been better.  She states she is less itchy and has hives less frequently.  However she states she had some hives last night.  She states over the past 3 months she has had about 20 days where she has noted hives.  This is an improvement.  She is taking Zyrtec twice a day and Pepcid twice a day.  She did try Singulair but states she felt like she was having shortness of breath, cough and increased itch.  She stopped Singulair and these issues resolved.  She states she did look into Xolair after the last visit and is interested.  Review of systems: Review of Systems  Constitutional: Negative.   HENT: Negative.   Eyes: Negative.   Respiratory: Negative.   Cardiovascular: Negative.   Gastrointestinal: Negative.   Musculoskeletal: Negative.   Skin: Positive for itching and rash.  Neurological: Negative.     All other systems negative unless noted above in HPI  Past medical/social/surgical/family history have been reviewed and are unchanged unless specifically indicated below.  No changes  Medication List: Current Outpatient Medications  Medication Sig Dispense Refill  . hydrochlorothiazide (HYDRODIURIL) 25 MG tablet Take 1 tablet (25 mg total) by mouth daily. 90 tablet 1  . losartan (COZAAR) 50 MG tablet TAKE 1 TABLET BY MOUTH EVERY DAY 90 tablet 1  . meloxicam (MOBIC) 15 MG tablet Take 15 mg by mouth daily.    . rosuvastatin (CRESTOR) 10 MG tablet Take by mouth.    . triamcinolone cream (KENALOG) 0.1 % Apply 1 application topically 2 (two) times daily. 100 g 0  . verapamil (VERELAN  PM) 180 MG 24 hr capsule TAKE 1 CAPSULE BY MOUTH AT BEDTIME 90 capsule 1  . zolpidem (AMBIEN) 5 MG tablet TAKE 1 TABLET AT BEDTIME AS NEEDED FOR SLEEP DO NOT TAKE ON THE NIGHTS YOU TAKE OTHER MEDS FOR SLEEP 15 tablet 1   No current facility-administered medications for this visit.     Known medication allergies: Allergies  Allergen Reactions  . Lisinopril Cough  . Shellfish Allergy   . Valsartan Hives  . Benadryl Allergy  [Diphenhydramine Hcl (Sleep)] Cough     Physical examination: Blood pressure 130/78, pulse 70, resp. rate 16, SpO2 96 %.  General: Alert, interactive, in no acute distress. HEENT: PERRLA, TMs pearly gray, turbinates non-edematous without discharge, post-pharynx non erythematous. Neck: Supple without lymphadenopathy. Lungs: Clear to auscultation without wheezing, rhonchi or rales. {no increased work of breathing. CV: Normal S1, S2 without murmurs. Abdomen: Nondistended, nontender. Skin: Warm and dry, without lesions or rashes. Extremities:  No clubbing, cyanosis or edema. Neuro:   Grossly intact.  Diagnositics/Labs: Labs:  Component     Latest Ref Rng & Units 09/16/2019  IgE (Immunoglobulin E), Serum     6 - 495 IU/mL 9  D Pteronyssinus IgE     Class 0 kU/L <0.10  D Farinae IgE     Class 0 kU/L <0.10  Cat Dander IgE     Class 0 kU/L <0.10  Dog Dander IgE     Class 0 kU/L <0.10  [place] Grass IgE  Class 0 kU/L <0.10  Timothy Grass IgE     Class 0 kU/L <0.10  Johnson Grass IgE     Class 0 kU/L <0.10  Cockroach, German IgE     Class 0 kU/L <0.10  Penicillium Chrysogen IgE     Class 0 kU/L <0.10  Cladosporium Herbarum IgE     Class 0 kU/L <0.10  Aspergillus Fumigatus IgE     Class 0 kU/L <0.10  Alternaria Alternata IgE     Class 0 kU/L <0.10  Maple/Box Elder IgE     Class 0 kU/L <0.10  Common Silver Wendee Copp IgE     Class 0 kU/L <0.10  Cedar, [place] IgE     Class 0 kU/L <0.10  Oak, White IgE     Class 0 kU/L <0.10  Elm, American IgE      Class 0 kU/L <0.10  [place] IgE     Class 0 kU/L <0.10  Pecan, Hickory IgE     Class 0 kU/L <0.10  White Mulberry IgE     Class 0 kU/L <0.10  Ragweed, Short IgE     Class 0 kU/L <0.10  Pigweed, Rough IgE     Class 0 kU/L <0.10  Sheep Sorrel IgE Qn     Class 0 kU/L <0.10  Mouse Urine IgE     Class 0 kU/L <0.10  WBC     3.4 - 10.8 x10E3/uL 3.9  RBC     3.77 - 5.28 x10E6/uL 4.16  Hemoglobin     11.1 - 15.9 g/dL 12.2  HCT     34.0 - 46.6 % 36.9  MCV     79 - 97 fL 89  MCH     26.6 - 33.0 pg 29.3  MCHC     31 - 35 g/dL 33.1  RDW     11.7 - 15.4 % 12.9  Neutrophils     Not Estab. % 48  Lymphs     Not Estab. % 38  Monocytes     Not Estab. % 9  Eos     Not Estab. % 4  Basos     Not Estab. % 1  NEUT#     1.40 - 7.00 x10E3/uL 1.9  Lymphocyte #     0 - 3 x10E3/uL 1.5  Monocytes Absolute     0 - 0 x10E3/uL 0.3  EOS (ABSOLUTE)     0.0 - 0.4 x10E3/uL 0.2  Basophils Absolute     0 - 0 x10E3/uL 0.0  Immature Granulocytes     Not Estab. % 0  Immature Grans (Abs)     0.0 - 0.1 x10E3/uL 0.0  Glucose     65 - 99 mg/dL 90  BUN     6 - 24 mg/dL 12  Creatinine     0.57 - 1.00 mg/dL 0.68  GFR, Est Non African American     >59 mL/min/1.73 101  GFR, Est African American     >59 mL/min/1.73 116  BUN/Creatinine Ratio     9 - 23 18  Sodium     134 - 144 mmol/L 140  Potassium     3.5 - 5.2 mmol/L 4.7  Chloride     96 - 106 mmol/L 102  CO2     20 - 29 mmol/L 25  Calcium     8.7 - 10.2 mg/dL 9.5  Total Protein     6.0 - 8.5 g/dL 7.2  Albumin     3.8 - 4.9  g/dL 4.7  Globulin, Total     1.5 - 4.5 g/dL 2.5  Albumin/Globulin Ratio     1.2 - 2.2 1.9  Total Bilirubin     0.0 - 1.2 mg/dL 0.3  Alkaline Phosphatase     48 - 121 IU/L 105  AST     0 - 40 IU/L 22  ALT     0 - 32 IU/L 15  Beef (Bos spp) IgE     <0.35 kU/L <0.10  Class Interpretation      0  Lamb/Mutton (Ovis spp) IgE     <0.35 kU/L <0.10  Class Interpretation      0  Pork (Sus spp) IgE      <0.35 kU/L <0.10  Class Interpretation      0  Alpha Gal IgE*     <0.10 kU/L <0.10  Thyroperoxidase Ab SerPl-aCnc     0 - 34 IU/mL <8  Thyroglobulin Antibody     0.0 - 0.9 IU/mL <1.0  cu index     <10 34.2 (H)  Tryptase     2.2 - 13.2 ug/L 5.4  Sed Rate     0 - 40 mm/hr 43 (H)  Anti Nuclear Antibody (ANA)     Negative Negative     Assessment and plan: chronic urticaria   - at this time etiology of hives and swelling is autoimmune in nature with a positive chronic urticaria index.  The chronic urticaria index indicates that she does make it a self antibody that can target her allergy cells leading to recurrent and chronic hives.  -Continue the following high-dose antihistamine regimen: Zyrtec 87m 1 tab twice a day, Pepcid 268m1 tab twice a day daily advised that the max dosing of Zyrtec is 4 tablets a day.  -Since she is still having hives despite antihistamine regimen we did discuss Xolair injections including benefits, risk and protocol.  We will have Tammy, our biologic nurse coordinator, discuss Xolair with her and thus initiate therapy.  She will need access to an epinephrine device upon starting.   - should significant symptoms recur or new symptoms occur, a journal is to be kept recording any foods eaten, beverages consumed, medications taken, activities performed, and environmental conditions within a 6 hour time period prior to the onset of symptoms.   Follow-up in 3 months or sooner if needed  I appreciate the opportunity [facility]. Please do not hesitate to contact me with questions.  Sincerely,   ShPrudy FeelerMD Allergy/Immunology [facility] [place]

## 2019-11-30 NOTE — Patient Instructions (Addendum)
Chronic hives  - at this time etiology of hives and swelling is autoimmune in nature with a positive chronic urticaria index.  The chronic urticaria index indicates that she does make it a self antibody that can target her allergy cells leading to recurrent and chronic hives.  -Continue the following high-dose antihistamine regimen: Zyrtec 52m 1 tab twice a day, Pepcid 228m1 tab twice a day daily advised that the max dosing of Zyrtec is 4 tablets a day.  -Since she is still having hives despite antihistamine regimen we did discuss Xolair injections including benefits, risk and protocol.  We will have Tammy, our biologic nurse coordinator, discuss Xolair with her and thus initiate therapy.  She will need access to an epinephrine device upon starting.   - should significant symptoms recur or new symptoms occur, a journal is to be kept recording any foods eaten, beverages consumed, medications taken, activities performed, and environmental conditions within a 6 hour time period prior to the onset of symptoms.   Follow-up in 3 months or sooner if needed

## 2019-12-01 ENCOUNTER — Telehealth: Payer: Self-pay | Admitting: *Deleted

## 2019-12-01 NOTE — Telephone Encounter (Signed)
Called patient and advised will get approval, copay card and submit to [facility]

## 2019-12-01 NOTE — Telephone Encounter (Signed)
-----   Message from [place], MD sent at 11/30/2019  4:34 PM EDT ----- Patient is interested in starting Xolair for hives.

## 2019-12-18 ENCOUNTER — Other Ambulatory Visit: Payer: Self-pay | Admitting: Family Medicine

## 2019-12-18 DIAGNOSIS — E785 Hyperlipidemia, unspecified: Secondary | ICD-10-CM

## 2019-12-18 NOTE — Telephone Encounter (Signed)
Notes to clinic: medication filled by a historical provider  Review for refill   Requested Prescriptions  Pending Prescriptions Disp Refills   rosuvastatin (CRESTOR) 10 MG tablet [Pharmacy Med Name: ROSUVASTATIN CALCIUM 10 MG TAB] 90 tablet 1    Sig: TAKE 1 TABLET BY MOUTH EVERY DAY      Cardiovascular:  Antilipid - Statins Failed - 12/18/2019  9:02 AM      Failed - LDL in normal range and within 360 days    LDL Chol Calc (NIH)  Date Value Ref Range Status  05/16/2019 95 0 - 99 mg/dL Final          Passed - Total Cholesterol in normal range and within 360 days    Cholesterol, Total  Date Value Ref Range Status  05/16/2019 185 100 - 199 mg/dL Final          Passed - HDL in normal range and within 360 days    HDL  Date Value Ref Range Status  05/16/2019 77 >39 mg/dL Final          Passed - Triglycerides in normal range and within 360 days    Triglycerides  Date Value Ref Range Status  05/16/2019 67 0 - 149 mg/dL Final          Passed - Patient is not pregnant      Passed - Valid encounter within last 12 months    Recent Outpatient Visits           4 months ago Essential hypertension   [place], Amy J, NP   5 months ago Essential hypertension   [place], Jarome Matin, RPH-CPP   6 months ago Hyperlipidemia, unspecified hyperlipidemia type   [place], Stephen L, RPH-CPP   7 months ago Essential hypertension   [place], Jarome Matin, RPH-CPP   8 months ago Essential hypertension   [place], MD       Future Appointments             In 3 months Padgett, Rae Halsted, MD [facility]             Signed Prescriptions Disp Refills   losartan (COZAAR) 50 MG tablet 90 tablet 1    Sig: TAKE 1 TABLET BY MOUTH EVERY DAY       Cardiovascular:  Angiotensin Receptor Blockers Passed - 12/18/2019  9:02 AM      Passed - Cr in normal range and within 180 days    Creat  Date Value Ref Range Status  03/14/2016 0.87 0.50 - 1.10 mg/dL Final   Creatinine, Ser  Date Value Ref Range Status  09/16/2019 0.68 0.57 - 1.00 mg/dL Final          Passed - K in normal range and within 180 days    Potassium  Date Value Ref Range Status  09/16/2019 4.7 3.5 - 5.2 mmol/L Final          Passed - Patient is not pregnant      Passed - Last BP in normal range    BP Readings from Last 1 Encounters:  11/30/19 130/78          Passed - Valid encounter within last 6 months    Recent Outpatient Visits  4 months ago Essential hypertension   [place], [place], NP   5 months ago Essential hypertension   [place], Jarome Matin, RPH-CPP   6 months ago Hyperlipidemia, unspecified hyperlipidemia type   [place], Jarome Matin, RPH-CPP   7 months ago Essential hypertension   [place], Stephen L, RPH-CPP   8 months ago Essential hypertension   [place], MD       Future Appointments             In 3 months Padgett, Rae Halsted, MD Allergy and [place]

## 2020-03-16 ENCOUNTER — Other Ambulatory Visit: Payer: Self-pay | Admitting: Allergy

## 2020-03-29 ENCOUNTER — Other Ambulatory Visit: Payer: Self-pay | Admitting: Allergy

## 2020-04-04 ENCOUNTER — Ambulatory Visit: Payer: BC Managed Care – PPO | Admitting: Allergy

## 2020-04-04 ENCOUNTER — Other Ambulatory Visit: Payer: Self-pay

## 2020-04-04 VITALS — BP 130/90 | HR 72 | Temp 97.9°F | Resp 14 | Ht 63.0 in | Wt 238.4 lb

## 2020-04-04 DIAGNOSIS — L508 Other urticaria: Secondary | ICD-10-CM

## 2020-04-04 MED ORDER — CETIRIZINE HCL 10 MG PO TABS: 10.0000 mg | ORAL_TABLET | Freq: Two times a day (BID) | ORAL | 5 refills | Status: DC

## 2020-04-04 MED ORDER — FAMOTIDINE 20 MG PO TABS: 20.0000 mg | ORAL_TABLET | Freq: Two times a day (BID) | ORAL | 5 refills | Status: DC

## 2020-04-04 NOTE — Progress Notes (Signed)
Follow-up Note  RE: Andrea Wilkins MRN: [id number] DOB: 08-02-1965 Date of Office Visit: 04/04/2020   History of present illness: Andrea Wilkins is a 55 y.o. female presenting today for follow-up of chronic urticaria.  She was last seen in the office on 11/30/2019 by myself.  She states she has been doing relatively well without any major health changes, surgeries or hospitalizations.  She states her hives are quiet for about 4 to 5 months in between last visit and today.  She states over the past 2 weeks she has had return of her hives.  She was able to come off of her high-dose antihistamine regimen.  She has not resumed use at this time yet.  At last visit we did discuss starting Xolair however she states there was a a lot of confusion back-and-forth between the pharmacy to the point that she decided not to proceed with Xolair at that time.  Review of systems: Review of Systems  Constitutional: Negative.   HENT: Negative.   Eyes: Negative.   Respiratory: Negative.   Cardiovascular: Negative.   Gastrointestinal: Negative.   Musculoskeletal: Negative.   Skin: Positive for itching and rash.  Neurological: Negative.     All other systems negative unless noted above in HPI  Past medical/social/surgical/family history have been reviewed and are unchanged unless specifically indicated below.  No changes  Medication List: Current Outpatient Medications  Medication Sig Dispense Refill  . cetirizine (ZYRTEC ALLERGY) 10 MG tablet Take 1 tablet (10 mg total) by mouth 2 (two) times daily. 60 tablet 5  . famotidine (PEPCID) 20 MG tablet Take 1 tablet (20 mg total) by mouth 2 (two) times daily. 60 tablet 5  . hydrochlorothiazide (HYDRODIURIL) 25 MG tablet Take 1 tablet (25 mg total) by mouth daily. 90 tablet 1  . meloxicam (MOBIC) 15 MG tablet Take 15 mg by mouth daily.    . rosuvastatin (CRESTOR) 10 MG tablet TAKE 1 TABLET BY MOUTH EVERY DAY 30 tablet 0  . verapamil (VERELAN PM)  180 MG 24 hr capsule TAKE 1 CAPSULE BY MOUTH AT BEDTIME 90 capsule 1  . zolpidem (AMBIEN) 5 MG tablet TAKE 1 TABLET AT BEDTIME AS NEEDED FOR SLEEP DO NOT TAKE ON THE NIGHTS YOU TAKE OTHER MEDS FOR SLEEP 15 tablet 1  . losartan (COZAAR) 50 MG tablet TAKE 1 TABLET BY MOUTH EVERY DAY (Patient not taking: Reported on 04/04/2020) 90 tablet 1  . montelukast (SINGULAIR) 10 MG tablet TAKE 1 TABLET BY MOUTH EVERYDAY AT BEDTIME (Patient not taking: Reported on 04/04/2020) 30 tablet 0  . triamcinolone cream (KENALOG) 0.1 % Apply 1 application topically 2 (two) times daily. (Patient not taking: Reported on 04/04/2020) 100 g 0   No current facility-administered medications for this visit.     Known medication allergies: Allergies  Allergen Reactions  . Lisinopril Cough  . Shellfish Allergy   . Valsartan Hives  . Benadryl Allergy  [Diphenhydramine Hcl (Sleep)] Cough     Physical examination: Blood pressure 130/90, pulse 72, temperature 97.9 F (36.6 C), resp. rate 14, height 5' 3"  (1.6 m), weight 238 lb 6.4 oz (108.1 kg), SpO2 97 %.  General: Alert, interactive, in no acute distress. HEENT: PERRLA, TMs pearly gray, turbinates non-edematous without discharge, post-pharynx non erythematous. Neck: Supple without lymphadenopathy. Lungs: Clear to auscultation without wheezing, rhonchi or rales. {no increased work of breathing. CV: Normal S1, S2 without murmurs. Abdomen: Nondistended, nontender. Skin: Warm and dry, without lesions or rashes. Extremities:  No clubbing, cyanosis  or edema. Neuro:   Grossly intact.  Diagnositics/Labs: None today  Assessment and plan:   Chronic urticaria  - etiology of hives and swelling is autoimmune in nature with a positive chronic urticaria index.  The chronic urticaria index indicates that she does make it a self antibody that can target her allergy cells leading to recurrent and chronic hives.  -Resume the following high-dose antihistamine regimen: Zyrtec 5m 1 tab  twice a day, Pepcid 284m1 tab twice a day   (max daily dosing of Zyrtec is 4 tablets a day)  -if high dose regimen above is not effective in managing hives let usKoreanow and will try again with Xolair with different pharmacy   - should significant symptoms recur or new symptoms occur, a journal is to be kept recording any foods eaten, beverages consumed, medications taken, activities performed, and environmental conditions within a 6 hour time period prior to the onset of symptoms.   Follow-up in 4-6 months or sooner if needed   No follow-ups on file.  I appreciate the opportunity to take part in [facility]. Please do not hesitate to contact me with questions.  Sincerely,   ShPrudy FeelerMD Allergy/Immunology [facility] [place]

## 2020-04-04 NOTE — Patient Instructions (Signed)
Chronic hives  - etiology of hives and swelling is autoimmune in nature with a positive chronic urticaria index.  The chronic urticaria index indicates that she does make it a self antibody that can target her allergy cells leading to recurrent and chronic hives.  -Resume the following high-dose antihistamine regimen: Zyrtec 9m 1 tab twice a day, Pepcid 287m1 tab twice a day   (max daily dosing of Zyrtec is 4 tablets a day)  -if high dose regimen above is not effective in managing hives let usKoreanow and will try again with Xolair with different pharmacy   - should significant symptoms recur or new symptoms occur, a journal is to be kept recording any foods eaten, beverages consumed, medications taken, activities performed, and environmental conditions within a 6 hour time period prior to the onset of symptoms.   Follow-up in 4-6 months or sooner if needed

## 2020-04-05 ENCOUNTER — Encounter: Payer: Self-pay | Admitting: Allergy

## 2020-04-26 ENCOUNTER — Other Ambulatory Visit: Payer: Self-pay | Admitting: Family

## 2020-05-22 ENCOUNTER — Other Ambulatory Visit: Payer: Self-pay | Admitting: Family

## 2020-05-22 ENCOUNTER — Other Ambulatory Visit: Payer: Self-pay | Admitting: Internal Medicine

## 2020-05-22 DIAGNOSIS — I10 Essential (primary) hypertension: Secondary | ICD-10-CM

## 2020-05-22 DIAGNOSIS — G4700 Insomnia, unspecified: Secondary | ICD-10-CM

## 2020-05-22 NOTE — Telephone Encounter (Signed)
Notes to clinic: Patient has upcoming appointment on 07/19/2020 Review for refill until appointment date    Requested Prescriptions  Pending Prescriptions Disp Refills   losartan (COZAAR) 50 MG tablet 90 tablet 1    Sig: Take 1 tablet (50 mg total) by mouth daily.      Cardiovascular:  Angiotensin Receptor Blockers Failed - 05/22/2020 12:03 PM      Failed - Cr in normal range and within 180 days    Creat  Date Value Ref Range Status  03/14/2016 0.87 0.50 - 1.10 mg/dL Final   Creatinine, Ser  Date Value Ref Range Status  09/16/2019 0.68 0.57 - 1.00 mg/dL Final          Failed - K in normal range and within 180 days    Potassium  Date Value Ref Range Status  09/16/2019 4.7 3.5 - 5.2 mmol/L Final          Failed - Last BP in normal range    BP Readings from Last 1 Encounters:  04/04/20 130/90          Failed - Valid encounter within last 6 months    Recent Outpatient Visits           10 months ago Essential hypertension   [place], [place], NP   11 months ago Essential hypertension   [place], Stephen L, RPH-CPP   1 year ago Hyperlipidemia, unspecified hyperlipidemia type   [place], Jarome Matin, RPH-CPP   1 year ago Essential hypertension   [place], Jarome Matin, RPH-CPP   1 year ago Essential hypertension   [place], MD       Future Appointments             In 1 month Ladell Pier, MD [place]   In 4 months Padgett, Rae Halsted, MD Allergy and [place] - Patient is not pregnant        verapamil (VERELAN PM) 180 MG 24 hr capsule 90 capsule 1    Sig: Take 1 capsule (180 mg total) by mouth at bedtime.      Cardiovascular:  Calcium Channel Blockers Failed -  05/22/2020 12:03 PM      Failed - Last BP in normal range    BP Readings from Last 1 Encounters:  04/04/20 130/90          Failed - Valid encounter within last 6 months    Recent Outpatient Visits           10 months ago Essential hypertension   [place], [place], NP   11 months ago Essential hypertension   [place], Jarome Matin, RPH-CPP   1 year ago Hyperlipidemia, unspecified hyperlipidemia type   [place], Jarome Matin, RPH-CPP   1 year ago Essential hypertension   [place], RPH-CPP   1 year ago Essential hypertension   [place], MD       Future Appointments             In 1 month [place],  Dalbert Batman, MD [place]   In 4 months Padgett, Rae Halsted, MD Allergy and [place]               zolpidem [facility]) 5 MG tablet 15 tablet 1      Not Delegated - Psychiatry:  Anxiolytics/Hypnotics Failed - 05/22/2020 12:03 PM      Failed - This refill cannot be delegated      Failed - Urine Drug Screen completed in last 360 days      Failed - Valid encounter within last 6 months    Recent Outpatient Visits           10 months ago Essential hypertension   [place], [place], NP   11 months ago Essential hypertension   [place], RPH-CPP   1 year ago Hyperlipidemia, unspecified hyperlipidemia type   [place], Jarome Matin, RPH-CPP   1 year ago Essential hypertension   [place], Jarome Matin, RPH-CPP   1 year ago Essential hypertension   [place], MD       Future Appointments              In 1 month Wynetta Emery Dalbert Batman, MD [place]   In 4 months Padgett, Rae Halsted, MD Allergy and [place]

## 2020-05-22 NOTE — Telephone Encounter (Signed)
Copied from [place] [id number]. Topic: Quick Communication - Rx Refill/Question >> May 22, 2020 11:55 AM Mcneil, Jacinto Reap wrote: Medication: losartan (COZAAR) 50 MG tablet, verapamil (VERELAN PM) 180 MG 24 hr capsule, and zolpidem (AMBIEN) 5 MG tablet  Has the patient contacted their pharmacy? yes - Pt advised to contact provider  Preferred Pharmacy (with phone number or street name): [id number] [address]: [phone] Fax: [phone] Agent: Please be advised that RX refills may take up to 3 business days. We ask that you follow-up with your pharmacy.

## 2020-05-24 ENCOUNTER — Observation Stay (HOSPITAL_COMMUNITY)
Admission: EM | Admit: 2020-05-24 | Discharge: 2020-05-25 | Disposition: A | Discharge status: Home / Self Care | Payer: BC Managed Care – PPO | Attending: Internal Medicine | Admitting: Internal Medicine

## 2020-05-24 ENCOUNTER — Ambulatory Visit: Payer: Self-pay | Admitting: *Deleted

## 2020-05-24 ENCOUNTER — Other Ambulatory Visit: Payer: Self-pay | Admitting: Family

## 2020-05-24 ENCOUNTER — Emergency Department (HOSPITAL_COMMUNITY): Payer: BC Managed Care – PPO

## 2020-05-24 DIAGNOSIS — Z20822 Contact with and (suspected) exposure to covid-19: Secondary | ICD-10-CM | POA: Insufficient documentation

## 2020-05-24 DIAGNOSIS — Z79899 Other long term (current) drug therapy: Secondary | ICD-10-CM | POA: Insufficient documentation

## 2020-05-24 DIAGNOSIS — I160 Hypertensive urgency: Secondary | ICD-10-CM | POA: Diagnosis not present

## 2020-05-24 DIAGNOSIS — R7989 Other specified abnormal findings of blood chemistry: Secondary | ICD-10-CM | POA: Insufficient documentation

## 2020-05-24 DIAGNOSIS — J45909 Unspecified asthma, uncomplicated: Secondary | ICD-10-CM | POA: Insufficient documentation

## 2020-05-24 DIAGNOSIS — E785 Hyperlipidemia, unspecified: Secondary | ICD-10-CM

## 2020-05-24 DIAGNOSIS — K5090 Crohn's disease, unspecified, without complications: Secondary | ICD-10-CM | POA: Diagnosis present

## 2020-05-24 DIAGNOSIS — I10 Essential (primary) hypertension: Secondary | ICD-10-CM | POA: Diagnosis not present

## 2020-05-24 DIAGNOSIS — M069 Rheumatoid arthritis, unspecified: Secondary | ICD-10-CM | POA: Diagnosis present

## 2020-05-24 DIAGNOSIS — E782 Mixed hyperlipidemia: Secondary | ICD-10-CM | POA: Diagnosis present

## 2020-05-24 DIAGNOSIS — R0789 Other chest pain: Principal | ICD-10-CM | POA: Insufficient documentation

## 2020-05-24 DIAGNOSIS — R079 Chest pain, unspecified: Secondary | ICD-10-CM | POA: Diagnosis present

## 2020-05-24 LAB — CBC WITH DIFFERENTIAL/PLATELET
Abs Immature Granulocytes: 0.01 10*3/uL (ref 0.00–0.07)
Basophils Absolute: 0 10*3/uL (ref 0.0–0.1)
Basophils Relative: 0 %
Eosinophils Absolute: 0.1 10*3/uL (ref 0.0–0.5)
Eosinophils Relative: 3 %
HCT: 33.8 % — ABNORMAL LOW (ref 36.0–46.0)
Hemoglobin: 10.8 g/dL — ABNORMAL LOW (ref 12.0–15.0)
Immature Granulocytes: 0 %
Lymphocytes Relative: 39 %
Lymphs Abs: 1.5 10*3/uL (ref 0.7–4.0)
MCH: 28.1 pg (ref 26.0–34.0)
MCHC: 32 g/dL (ref 30.0–36.0)
MCV: 88 fL (ref 80.0–100.0)
Monocytes Absolute: 0.3 10*3/uL (ref 0.1–1.0)
Monocytes Relative: 8 %
Neutro Abs: 1.9 10*3/uL (ref 1.7–7.7)
Neutrophils Relative %: 50 %
Platelets: 270 10*3/uL (ref 150–400)
RBC: 3.84 MIL/uL — ABNORMAL LOW (ref 3.87–5.11)
RDW: 13.2 % (ref 11.5–15.5)
WBC: 3.8 10*3/uL — ABNORMAL LOW (ref 4.0–10.5)
nRBC: 0 % (ref 0.0–0.2)

## 2020-05-24 LAB — COMPREHENSIVE METABOLIC PANEL
ALT: 11 U/L (ref 0–44)
AST: 16 U/L (ref 15–41)
Albumin: 3.9 g/dL (ref 3.5–5.0)
Alkaline Phosphatase: 87 U/L (ref 38–126)
Anion gap: 6 (ref 5–15)
BUN: 11 mg/dL (ref 6–20)
CO2: 27 mmol/L (ref 22–32)
Calcium: 9.1 mg/dL (ref 8.9–10.3)
Chloride: 103 mmol/L (ref 98–111)
Creatinine, Ser: 0.7 mg/dL (ref 0.44–1.00)
GFR, Estimated: >60 mL/min (ref >60)
Glucose, Bld: 87 mg/dL (ref 70–99)
Potassium: 3.6 mmol/L (ref 3.5–5.1)
Sodium: 136 mmol/L (ref 135–145)
Total Bilirubin: 0.5 mg/dL (ref 0.3–1.2)
Total Protein: 7.2 g/dL (ref 6.5–8.1)

## 2020-05-24 LAB — BRAIN NATRIURETIC PEPTIDE: B Natriuretic Peptide: 39.2 pg/mL (ref 0.0–100.0)

## 2020-05-24 LAB — TROPONIN I (HIGH SENSITIVITY)
Troponin I (High Sensitivity): 5 ng/L (ref <18)
Troponin I (High Sensitivity): 5 ng/L (ref <18)

## 2020-05-24 MED ORDER — VERAPAMIL HCL ER 180 MG PO CP24: 180.0000 mg | ORAL_CAPSULE | Freq: Every day | ORAL | 0 refills | Status: DC

## 2020-05-24 MED ORDER — HYDROCHLOROTHIAZIDE 25 MG PO TABS
25.0000 mg | ORAL_TABLET | Freq: Every day | ORAL | Status: DC
  Administered 2020-05-24: 25 mg via ORAL
  Filled 2020-05-24: qty 1

## 2020-05-24 MED ORDER — LOSARTAN POTASSIUM 50 MG PO TABS: 1.0000 | ORAL_TABLET | Freq: Every day | ORAL | 0 refills | Status: DC

## 2020-05-24 MED ORDER — ALUM & MAG HYDROXIDE-SIMETH 200-200-20 MG/5ML PO SUSP
30.0000 mL | Freq: Once | ORAL | Status: AC
  Administered 2020-05-25: 30 mL via ORAL
  Filled 2020-05-24: qty 30

## 2020-05-24 MED ORDER — LIDOCAINE VISCOUS HCL 2 % MT SOLN
15.0000 mL | Freq: Once | OROMUCOSAL | Status: AC
  Administered 2020-05-25: 15 mL via ORAL
  Filled 2020-05-24: qty 15

## 2020-05-24 MED ORDER — IBUPROFEN 400 MG PO TABS
600.0000 mg | ORAL_TABLET | Freq: Once | ORAL | Status: AC
  Administered 2020-05-24: 600 mg via ORAL
  Filled 2020-05-24: qty 1

## 2020-05-24 MED ORDER — ONDANSETRON 4 MG PO TBDP
4.0000 mg | ORAL_TABLET | Freq: Once | ORAL | Status: AC
  Administered 2020-05-24: 4 mg via ORAL
  Filled 2020-05-24: qty 1

## 2020-05-24 MED ORDER — HYDROCHLOROTHIAZIDE 25 MG PO TABS: 25.0000 mg | ORAL_TABLET | Freq: Every day | ORAL | Status: DC

## 2020-05-24 MED ORDER — LOSARTAN POTASSIUM 50 MG PO TABS
50.0000 mg | ORAL_TABLET | Freq: Every day | ORAL | Status: DC
  Administered 2020-05-24: 50 mg via ORAL
  Filled 2020-05-24: qty 1

## 2020-05-24 MED ORDER — LOSARTAN POTASSIUM 50 MG PO TABS: 50.0000 mg | ORAL_TABLET | Freq: Every day | ORAL | Status: DC

## 2020-05-24 NOTE — Telephone Encounter (Signed)
[place],   No past or future encounters scheduled with this patient. She has an appointment scheduled with Dr. Wynetta Emery on 07/19/2020. Please send refill request to her to see if she would like to refill.   Thank you

## 2020-05-24 NOTE — ED Provider Notes (Signed)
[facility] [facility] EMERGENCY DEPARTMENT Provider Note   CSN: [id number] Arrival date & time: 05/24/20  1831     History Chief Complaint  Patient presents with  . Chest Pain  . Shortness of Breath    Andrea Wilkins is a 55 y.o. female with a history of hypertension, hyperlipidemia, urticaria, Crohn's disease.    Patient presents with chief complaint of chest pain.  Patient reports that she first noticed chest pain yesterday.  Chest pain started yesterday morning and was present throughout the day.  Patient reports that her chest pain improved last night and she was able to go to sleep.  Patient reports that her chest pain began again today at 1130 while sitting down at rest.  Patient describes her chest pain is midsternal with radiation to her left arm.  Patient endorses associated shortness of breath, nausea, vomiting, diaphoresis and lightheadedness.  Patient describes her chest pain as a pressure.  Patient reports initially 6/10 on the pain scale.  Pain at present is 5/10 on the pain scale.  Patient denies any previous history of MI or similar chest pain.  Patient also endorses hypertension.  Reports that she takes hydrochlorothiazide and losartan nightly.  Patient did not take any of her medication today.  Patient endorses a generalized headache.  Patient reports that this headache was gradual in onset and pain not maximal at onset.  Patient reports similar headaches to this in the past.  Patient denies any visual disturbance, facial symmetry, focal neurological deficit.    Patient endorses leg swelling to left lower extremity yesterday.  Denied having any associated tenderness with the swelling.  HPI     Past Medical History:  Diagnosis Date  . Arthritis 2015  . Crohn's disease ([place])   . Hypertension Dx 2013  . Urticaria     Patient Active Problem List   Diagnosis Date Noted  . Psoriatic arthritis ([place]) 08/03/2019  . Extensor tenosynovitis of left wrist  06/13/2016  . Ganglion cyst of wrist, left 06/13/2016  . Obesity, morbid, BMI 40.0-49.9 ([place]) 03/14/2016  . Left leg pain 06/08/2015  . Intertrigo 04/02/2015  . Scaly patch rash 04/02/2015  . Macular erythematous rash 04/02/2015  . Tinea corporis 03/05/2015  . Tinea pedis 03/05/2015  . Insomnia 03/05/2015  . Abnormal CT scan, head 12/15/2014  . 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 Occipital headache 12/15/2014  . Radicular pain in left arm 12/15/2014  . IDA (iron deficiency anemia) 10/27/2013  . Rheumatoid arthritis ([place]) 10/27/2013  . Crohn disease ([place]) 10/23/2012  . Asthma 10/23/2012  . Tension headache 06/14/2012  . Hypertension 06/14/2012    Past Surgical History:  Procedure Laterality Date  . ABDOMINAL HYSTERECTOMY  2008  . BREAST SURGERY  2007  . CESAREAN SECTION  1988  . SMALL INTESTINE SURGERY  2008     OB History   No obstetric history on file.     Family History  Problem Relation Age of Onset  . Hypertension Mother   . Diabetes Sister   . Hypertension Sister   . Diabetes Father     Social History   Tobacco Use  . Smoking status: Never Smoker  . Smokeless tobacco: Never Used  Vaping Use  . Vaping Use: Never used  Substance Use Topics  . Alcohol use: No  . Drug use: No    Home Medications Prior to Admission medications   Medication Sig Start Date End Date Taking? Authorizing Provider  cetirizine (ZYRTEC ALLERGY) 10 MG  tablet Take 1 tablet (10 mg total) by mouth 2 (two) times daily. 04/04/20   Kennith Gain, MD  famotidine (PEPCID) 20 MG tablet Take 1 tablet (20 mg total) by mouth 2 (two) times daily. 04/04/20   Kennith Gain, MD  hydrochlorothiazide (HYDRODIURIL) 25 MG tablet Take 1 tablet (25 mg total) by mouth daily. 11/05/18   Fulp, Cammie, MD  losartan (COZAAR) 50 MG tablet Take 1 tablet (50 mg total) by mouth daily. 05/24/20   Charlott Rakes, MD  meloxicam (MOBIC) 15 MG tablet Take 15 mg by mouth daily. 11/02/19    [provider]  montelukast (SINGULAIR) 10 MG tablet TAKE 1 TABLET BY MOUTH EVERYDAY AT BEDTIME Patient not taking: Reported on 04/04/2020 03/30/20   Kennith Gain, MD  rosuvastatin (CRESTOR) 10 MG tablet TAKE 1 TABLET BY MOUTH EVERY DAY 12/20/19   Fulp, Cammie, MD  triamcinolone cream (KENALOG) 0.1 % Apply 1 application topically 2 (two) times daily. Patient not taking: Reported on 04/04/2020 07/25/19   Camillia Herter, NP  verapamil (VERELAN PM) 180 MG 24 hr capsule Take 1 capsule (180 mg total) by mouth at bedtime. 05/24/20   Charlott Rakes, MD  zolpidem (AMBIEN) 5 MG tablet TAKE 1 TABLET AT BEDTIME AS NEEDED FOR SLEEP DO NOT TAKE ON THE NIGHTS YOU TAKE OTHER MEDS FOR SLEEP 10/25/19   Fulp, Cammie, MD    Allergies    Lisinopril, Shellfish allergy, Valsartan, and Benadryl allergy  [diphenhydramine hcl (sleep)]  Review of Systems   Review of Systems  Constitutional: Positive for diaphoresis. Negative for chills and fever.  Eyes: Negative for visual disturbance.  Respiratory: Positive for shortness of breath. Negative for cough.   Cardiovascular: Positive for chest pain and leg swelling. Negative for palpitations.  Gastrointestinal: Positive for nausea and vomiting. Negative for abdominal pain.  Genitourinary: Negative for difficulty urinating, dysuria, frequency and hematuria.  Musculoskeletal: Negative for back pain and neck pain.  Skin: Negative for color change and rash.  Neurological: Positive for headaches. Negative for dizziness, tremors, seizures, syncope, facial asymmetry, speech difficulty, light-headedness and numbness.  Psychiatric/Behavioral: Negative for confusion.    Physical Exam Updated Vital Signs BP (!) 170/95   Pulse 76   Temp 98.5 F (36.9 C) (Oral)   Resp 16   SpO2 97%   Physical Exam Vitals and nursing note reviewed.  Constitutional:      General: She is not in acute distress.    Appearance: She is obese. She is ill-appearing. She is not  toxic-appearing or diaphoretic.  HENT:     Head: Normocephalic.  Eyes:     General: No scleral icterus.       Right eye: No discharge.        Left eye: No discharge.  Cardiovascular:     Rate and Rhythm: Normal rate.     Heart sounds: Normal heart sounds.  Pulmonary:     Effort: Pulmonary effort is normal. No tachypnea, bradypnea or respiratory distress.     Breath sounds: Normal breath sounds. No stridor.     Comments: Patient able to speak in full complete sentences without difficulty Chest:     Chest wall: No mass, lacerations, deformity, swelling, tenderness, crepitus or edema.  Abdominal:     General: There is no distension. There are no signs of injury.     Palpations: Abdomen is soft. There is no mass or pulsatile mass.     Tenderness: There is no abdominal tenderness. There is no guarding or  rebound.     Hernia: There is no hernia in the umbilical area or ventral area.     Comments: Patient has rash noted to abdomen which she states is psoriasis  Musculoskeletal:     Cervical back: Neck supple.     Right lower leg: No swelling, deformity, lacerations, tenderness or bony tenderness. No edema.     Left lower leg: No swelling, deformity, lacerations, tenderness or bony tenderness. No edema.     Comments: L calf 48.5cm R calf 47cm  Skin:    General: Skin is warm and dry.     Coloration: Skin is not cyanotic or pale.  Neurological:     General: No focal deficit present.     Mental Status: She is alert.     GCS: GCS eye subscore is 4. GCS verbal subscore is 5. GCS motor subscore is 6.     Cranial Nerves: Cranial nerves are intact. No cranial nerve deficit or facial asymmetry.     Sensory: Sensation is intact.     Motor: No weakness, tremor, seizure activity or pronator drift.     Comments: CN II through XII intact.  Equal grip strength bilaterally.  +5 strength to bilateral upper extremities.  Patient able to hold both lower extremities in the air against gravity without  difficulty.  +5 strength to dorsiflexion and plantar flexion bilaterally.  Psychiatric:        Behavior: Behavior is cooperative.     ED Results / Procedures / Treatments   Labs (all labs ordered are listed, but only abnormal results are displayed) Labs Reviewed  CBC WITH DIFFERENTIAL/PLATELET - Abnormal; Notable for the following components:      Result Value   WBC 3.8 (*)    RBC 3.84 (*)    Hemoglobin 10.8 (*)    HCT 33.8 (*)    All other components within normal limits  SARS CORONAVIRUS 2 (TAT 6-24 HRS)  COMPREHENSIVE METABOLIC PANEL  BRAIN NATRIURETIC PEPTIDE  TROPONIN I (HIGH SENSITIVITY)  TROPONIN I (HIGH SENSITIVITY)    EKG EKG Interpretation  Date/Time:  Thursday May 24 2020 18:40:58 EDT Ventricular Rate:  77 PR Interval:  172 QRS Duration: 70 QT Interval:  382 QTC Calculation: 432 R Axis:   23 Text Interpretation: Normal sinus rhythm Cannot rule out Anterior infarct , age undetermined Abnormal ECG No significant change since last tracing Confirmed by Gareth Morgan [phone]) on 05/24/2020 9:21:52 PM   Radiology DG Chest 2 View  Result Date: 05/24/2020 CLINICAL DATA:  Chest pain, shortness of breath EXAM: CHEST - 2 VIEW COMPARISON:  None. FINDINGS: The heart is enlarged. Vascular calcifications are seen in the aortic arch. Both lungs are clear. The visualized skeletal structures are unremarkable. IMPRESSION: Cardiomegaly. No acute cardiopulmonary process. Aortic Atherosclerosis (ICD10-I70.0). Electronically Signed   By: Zerita Boers M.D.   On: 05/24/2020 19:25    Procedures Procedures  Medications Ordered in ED Medications  hydrochlorothiazide (HYDRODIURIL) tablet 25 mg (has no administration in time range)  losartan (COZAAR) tablet 50 mg (has no administration in time range)  ondansetron (ZOFRAN-ODT) disintegrating tablet 4 mg (4 mg Oral Given 05/24/20 1910)    ED Course  I have reviewed the triage vital signs and the nursing notes.  Pertinent labs &  imaging results that were available during my care of the patient were reviewed by me and considered in my medical decision making (see chart for details).    MDM Rules/Calculators/A&P  Alert 55 year old female no acute distress, nontoxic-appearing.  Patient presents with chief complaint of chest pain.  Chest pain began 1130 this morning.  Described as midsternal with radiation to left arm.  Patient endorses associated shortness of breath, nausea, vomiting, diaphoresis, and lightheadedness.  Patient's pain has been constant since then.  Patient denies any change in symptoms with exertion.  Patient endorses left leg swelling yesterday.    Patient noted to be hypertensive.  Patient reports that she takes HCTZ and losartan nightly.  Patient endorses headache.  Headache was gradual in onset, pain not maximal in onset, has had similar headaches in the past.  Patient denies any visual disturbance, slurred speech, facial asymmetry, focal neurological deficit.  On physical exam patient is noted to have no focal neurological deficit.  Low suspicion for subarachnoid hemorrhage at this time.  We will give patient her prescribed hypertensive medication and reevaluate blood pressure as well as headache.  On reevaluation patient had improvement in her blood pressure however headache still remains.  Patient was offered migraine cocktail however requested to start with ibuprofen.  Patient noted to have no swelling to bilateral lower extremities.  Calf measurements as noted above.  CMP is unremarkable. CBC shows slight leukopenia at 3.8 and anemia with hemoglobin at 10.8 and 33.8.  Patient denies any bloody stools, melena, hematuria, or hematemesis.  Has had previous hysterectomy.    EKG shows normal sinus rhythm with no significant change from previous tracing. Chest x-ray shows cardiomegaly with no acute cardiopulmonary process. Troponin 5 and 5 with delta of 0. BNP within normal  limits. Patient has heart score of 5 Due to patient's increased heart score will contact hospitalist for admission.  Spoke to hospitalist Dr. Cyd Silence who agreed to see the patient for admission.   Final Clinical Impression(s) / ED Diagnoses Final diagnoses:  Chest pain, unspecified type  Hypertension, unspecified type    Rx / DC Orders ED Discharge Orders    None       Dyann Ruddle 05/25/20 [id number]    Gareth Morgan, MD 05/25/20 1536

## 2020-05-24 NOTE — ED Provider Notes (Signed)
Emergency Medicine Provider Triage Evaluation Note  Andrea Wilkins, a 55 y.o. female evaluated in triage.  Pt complains of CP, Sob, HTN. Symptoms started earlier today. CP began while sitting down. Did not take BP medications today. SOB worse with exertion. No LE edema.   BP (!) 170/103 (BP Location: Left Arm)   Pulse 75   Temp 98.5 F (36.9 C) (Oral)   Resp (!) 26   SpO2 100%   Patient is alert, no acute distress, normal work of breathing Patient has episode of emesis while evaluating     Medically screening exam initiated at 7:02 PM. Appropriate orders placed.  Andrea Wilkins was informed that the remainder of the evaluation will be completed by another provider, this initial triage assessment does not replace that evaluation, and the importance of remaining in the ED until their evaluation is complete.       Andrea Wilkins, [place] N, PA-C 05/24/20 1919    Blanchie Dessert, MD 05/28/20 [phone]

## 2020-05-24 NOTE — Telephone Encounter (Signed)
Pt called stating her BP was 160/112 at 1000 on 05/24/20; this reading was done at the ENT's office; she normally runs 120s/80s; she ;last took her verapamil 4 days ago; she is having headaches rated 2 out of 10; she is also having chest tightness described as "like a small infant sitting on your chest"; this started 05/22/20; she says the chest pain is constant; she normally takes verapamil, hctz, and losartan; the pt says she is out of hctz, and her last dose of verapamil  was 4 days ago;  recommendations made per nurse triage protocol; she verbalized understanding and will proceed to the ED.will route to office for notification.  Reason for Disposition . [8] Systolic BP  >= 088 OR Diastolic >= 110 AND [3] cardiac or neurologic symptoms (e.g., chest pain, difficulty breathing, unsteady gait, blurred vision)  Answer Assessment - Initial Assessment Questions 1. BLOOD PRESSURE: "What is the blood pressure?" "Did you take at least two measurements 5 minutes apart?"     160/112 2. ONSET: "When did you take your blood pressure?"    05/24/20 at 1000 3. HOW: "How did you obtain the blood pressure?" (e.g., visiting nurse, automatic home BP monitor)   BP taken at ENT office 4. HISTORY: "Do you have a history of high blood pressure?"     yes 5. MEDICATIONS: "Are you taking any medications for blood pressure?" "Have you missed any doses recently?"     Yes verapamil last taken 05/20/20 6. OTHER SYMPTOMS: "Do you have any symptoms?" (e.g., headache, chest pain, blurred vision, difficulty breathing, weakness)     Constant chest pain, and headache 7. PREGNANCY: "Is there any chance you are pregnant?" "When was your last menstrual period?"  Protocols used: BLOOD PRESSURE - HIGH-A-AH

## 2020-05-24 NOTE — ED Triage Notes (Signed)
Pt reports chest pain, sob & HTN & nausea started today. Pt denies any cardiac hx however HTN and on 3 different meds

## 2020-05-24 NOTE — Telephone Encounter (Signed)
Losartan refilled per patient request on 05/24/2020 by Dr. Margarita Rana.

## 2020-05-24 NOTE — Telephone Encounter (Signed)
Apologies. Last OV from 07/25/19 is the last refill I was able to see with you. I made the patient a next available to establish with johnson on the 6/23. Will forward to her for courtesy fill.

## 2020-05-25 ENCOUNTER — Encounter (HOSPITAL_COMMUNITY): Payer: Self-pay | Admitting: Internal Medicine

## 2020-05-25 ENCOUNTER — Observation Stay (HOSPITAL_BASED_OUTPATIENT_CLINIC_OR_DEPARTMENT_OTHER): Payer: BC Managed Care – PPO

## 2020-05-25 ENCOUNTER — Observation Stay (HOSPITAL_COMMUNITY): Payer: BC Managed Care – PPO

## 2020-05-25 DIAGNOSIS — K5010 Crohn's disease of large intestine without complications: Secondary | ICD-10-CM | POA: Diagnosis not present

## 2020-05-25 DIAGNOSIS — I160 Hypertensive urgency: Secondary | ICD-10-CM | POA: Diagnosis not present

## 2020-05-25 DIAGNOSIS — M06061 Rheumatoid arthritis without rheumatoid factor, right knee: Secondary | ICD-10-CM

## 2020-05-25 DIAGNOSIS — R079 Chest pain, unspecified: Secondary | ICD-10-CM

## 2020-05-25 DIAGNOSIS — E782 Mixed hyperlipidemia: Secondary | ICD-10-CM | POA: Diagnosis not present

## 2020-05-25 DIAGNOSIS — R7989 Other specified abnormal findings of blood chemistry: Secondary | ICD-10-CM

## 2020-05-25 DIAGNOSIS — M06062 Rheumatoid arthritis without rheumatoid factor, left knee: Secondary | ICD-10-CM

## 2020-05-25 LAB — HIV ANTIBODY (ROUTINE TESTING W REFLEX): HIV Screen 4th Generation wRfx: NONREACTIVE

## 2020-05-25 LAB — LIPID PANEL
Cholesterol: 235 mg/dL — ABNORMAL HIGH (ref 0–200)
HDL: 67 mg/dL (ref >40)
LDL Cholesterol: 159 mg/dL — ABNORMAL HIGH (ref 0–99)
Total CHOL/HDL Ratio: 3.5 RATIO
Triglycerides: 47 mg/dL (ref <150)
VLDL: 9 mg/dL (ref 0–40)

## 2020-05-25 LAB — ECHOCARDIOGRAM COMPLETE
AR max vel: 3.41 cm2
AV Area VTI: 3.67 cm2
AV Area mean vel: 2.98 cm2
AV Mean grad: 3 mmHg
AV Peak grad: 5.5 mmHg
Ao pk vel: 1.17 m/s
Area-P 1/2: 4.21 cm2
MV VTI: 3.22 cm2
S' Lateral: 2.9 cm

## 2020-05-25 LAB — SARS CORONAVIRUS 2 (TAT 6-24 HRS): SARS Coronavirus 2: NEGATIVE

## 2020-05-25 LAB — SEDIMENTATION RATE: Sed Rate: 30 mm/hr — ABNORMAL HIGH (ref 0–22)

## 2020-05-25 LAB — D-DIMER, QUANTITATIVE: D-Dimer, Quant: 3.36 ug/mL-FEU — ABNORMAL HIGH (ref 0.00–0.50)

## 2020-05-25 LAB — TROPONIN I (HIGH SENSITIVITY): Troponin I (High Sensitivity): 6 ng/L (ref <18)

## 2020-05-25 MED ORDER — VERAPAMIL HCL ER 180 MG PO TBCR
180.0000 mg | EXTENDED_RELEASE_TABLET | Freq: Every day | ORAL | Status: DC
  Administered 2020-05-25: 180 mg via ORAL
  Filled 2020-05-25 (×2): qty 1

## 2020-05-25 MED ORDER — ROSUVASTATIN CALCIUM 10 MG PO TABS: 10.0000 mg | ORAL_TABLET | Freq: Every day | ORAL | 2 refills | Status: DC

## 2020-05-25 MED ORDER — MELOXICAM 7.5 MG PO TABS
15.0000 mg | ORAL_TABLET | Freq: Every day | ORAL | Status: DC
  Administered 2020-05-25: 15 mg via ORAL
  Filled 2020-05-25 (×2): qty 2

## 2020-05-25 MED ORDER — HYDROCHLOROTHIAZIDE 25 MG PO TABS: 25.0000 mg | ORAL_TABLET | Freq: Every day | ORAL | 2 refills | Status: DC

## 2020-05-25 MED ORDER — HYDROCHLOROTHIAZIDE 25 MG PO TABS
25.0000 mg | ORAL_TABLET | Freq: Every day | ORAL | Status: DC
  Administered 2020-05-25: 25 mg via ORAL
  Filled 2020-05-25: qty 1

## 2020-05-25 MED ORDER — LOSARTAN POTASSIUM 50 MG PO TABS: 1.0000 | ORAL_TABLET | Freq: Every day | ORAL | 2 refills | Status: DC

## 2020-05-25 MED ORDER — ZOLPIDEM TARTRATE 5 MG PO TABS: 5.0000 mg | ORAL_TABLET | Freq: Every evening | ORAL | 0 refills | Status: DC | PRN

## 2020-05-25 MED ORDER — NITROGLYCERIN 0.4 MG SL SUBL: 0.4000 mg | SUBLINGUAL_TABLET | SUBLINGUAL | Status: DC | PRN

## 2020-05-25 MED ORDER — HYDRALAZINE HCL 20 MG/ML IJ SOLN: 10.0000 mg | Freq: Four times a day (QID) | INTRAMUSCULAR | Status: DC | PRN

## 2020-05-25 MED ORDER — ONDANSETRON HCL 4 MG/2ML IJ SOLN: 4.0000 mg | Freq: Four times a day (QID) | INTRAMUSCULAR | Status: DC | PRN

## 2020-05-25 MED ORDER — PANTOPRAZOLE SODIUM 40 MG IV SOLR
40.0000 mg | Freq: Once | INTRAVENOUS | Status: AC
  Administered 2020-05-25: 40 mg via INTRAVENOUS
  Filled 2020-05-25: qty 40

## 2020-05-25 MED ORDER — ACETAMINOPHEN 325 MG PO TABS
650.0000 mg | ORAL_TABLET | ORAL | Status: DC | PRN
  Administered 2020-05-25: 650 mg via ORAL
  Filled 2020-05-25: qty 2

## 2020-05-25 MED ORDER — IOHEXOL 350 MG/ML SOLN
62.0000 mL | Freq: Once | INTRAVENOUS | Status: AC | PRN
  Administered 2020-05-25: 62 mL via INTRAVENOUS

## 2020-05-25 MED ORDER — LACTATED RINGERS IV SOLN: INTRAVENOUS | Status: AC

## 2020-05-25 MED ORDER — VERAPAMIL HCL ER 180 MG PO CP24: 180.0000 mg | ORAL_CAPSULE | Freq: Every day | ORAL | 2 refills | Status: DC

## 2020-05-25 MED ORDER — PANTOPRAZOLE SODIUM 40 MG PO TBEC: 40.0000 mg | DELAYED_RELEASE_TABLET | Freq: Every day | ORAL | Status: DC

## 2020-05-25 MED ORDER — ENOXAPARIN SODIUM 40 MG/0.4ML IJ SOSY
40.0000 mg | PREFILLED_SYRINGE | INTRAMUSCULAR | Status: DC
  Administered 2020-05-25: 40 mg via SUBCUTANEOUS
  Filled 2020-05-25: qty 0.4

## 2020-05-25 MED ORDER — LOSARTAN POTASSIUM 50 MG PO TABS
50.0000 mg | ORAL_TABLET | Freq: Every day | ORAL | Status: DC
  Administered 2020-05-25: 50 mg via ORAL
  Filled 2020-05-25: qty 1

## 2020-05-25 MED ORDER — POLYETHYLENE GLYCOL 3350 17 G PO PACK: 17.0000 g | PACK | Freq: Every day | ORAL | Status: DC | PRN

## 2020-05-25 MED ORDER — ROSUVASTATIN CALCIUM 5 MG PO TABS
10.0000 mg | ORAL_TABLET | Freq: Every day | ORAL | Status: DC
  Administered 2020-05-25: 10 mg via ORAL
  Filled 2020-05-25: qty 2

## 2020-05-25 NOTE — Progress Notes (Signed)
RN gave pt discharge instructions and the patient stated understanding. IV has been removed and new medications escribed to home pharmacy.

## 2020-05-25 NOTE — Progress Notes (Signed)
  Echocardiogram 2D Echocardiogram has been performed.  Andrea Wilkins 05/25/2020, 4:55 PM

## 2020-05-25 NOTE — ED Notes (Signed)
Pt ambulatory to restroom, independently Denies feeling short of breath or lightheaded.   Denies CP at this time

## 2020-05-25 NOTE — H&P (Signed)
History and Physical    Carline Dura OZH:[id number] DOB: 17-Aug-1965 DOA: 05/24/2020  PCP: Ladell Pier, MD  Patient coming from: home   Chief Complaint:  Chief Complaint  Patient presents with  . Chest Pain  . Shortness of Breath     HPI:    55 year old female with past medical history of Crohn's disease, hypertension, chronic urticaria, hyperlipidemia, Chiari malformation type I, rheumatoid arthritis who presents to [facility] emergency department complaints of chest pain.  Patient explains that on 4/27 the patient began to experience chest discomfort while sitting at her desk at work.  Patient describes the chest discomfort as midsternal in location, nonradiating, 7 out of 10 in intensity and associated with mild shortness of breath.  Patient denies any associated cough, fevers, diaphoresis or new onset leg swelling.  Patient denies any association with exertion.  Patient symptoms continued to persist throughout the afternoon and overnight.  The following day while the patient was continuing to have symptoms she presented to see her ENT provider.  Upon evaluation at ENT clinic she was told that she had extremely high blood pressure.  Of note, patient reports that she ran out of at least one of her antihypertensives approximately 5 days ago.  After leaving her ENT provider's office the patient presented to [facility] emergency department for evaluation due to ongoing chest discomfort and elevated blood pressures.  Upon evaluation in the emergency department patient was found to have markedly elevated blood pressures as high as 188/122.  Patient also complained of ongoing chest discomfort.  Several sets of cardiac enzymes were obtained.  A chest x-ray was additionally obtained which revealed no evidence of an acute process.  Due to ongoing chest discomfort and markedly elevated blood pressure the hospitalist group was then called to assess the patient for  admission to the hospital.   Review of Systems:   Review of Systems  Respiratory: Positive for shortness of breath.   Cardiovascular: Positive for chest pain.  All other systems reviewed and are negative.   Past Medical History:  Diagnosis Date  . Arthritis 2015  . Childhood asthma 10/23/2012  . Crohn's disease ([place])   . Hypertension Dx 2013  . Urticaria     Past Surgical History:  Procedure Laterality Date  . ABDOMINAL HYSTERECTOMY  2008  . BREAST SURGERY  2007  . CESAREAN SECTION  1988  . SMALL INTESTINE SURGERY  2008     reports that she has never smoked. She has never used smokeless tobacco. She reports that she does not drink alcohol and does not use drugs.  Allergies  Allergen Reactions  . Lisinopril Cough  . Shellfish Allergy   . Valsartan Hives  . Benadryl Allergy  [Diphenhydramine Hcl (Sleep)] Cough    Family History  Problem Relation Age of Onset  . Hypertension Mother   . Diabetes Sister   . Hypertension Sister   . Diabetes Father      Prior to Admission medications   Medication Sig Start Date End Date Taking? Authorizing Provider  albuterol (VENTOLIN HFA) 108 (90 Base) MCG/ACT inhaler Inhale 2 puffs into the lungs every 4 (four) hours as needed for shortness of breath or wheezing. 02/10/20  Yes [provider]  cetirizine (ZYRTEC ALLERGY) 10 MG tablet Take 1 tablet (10 mg total) by mouth 2 (two) times daily. Patient taking differently: Take 10 mg by mouth daily as needed for allergies. 04/04/20  Yes Kennith Gain, MD  hydrochlorothiazide (HYDRODIURIL) 25 MG  tablet Take 1 tablet (25 mg total) by mouth daily. Patient taking differently: Take 25 mg by mouth daily as needed (fluid). 11/05/18  Yes Fulp, Cammie, MD  losartan (COZAAR) 50 MG tablet Take 1 tablet (50 mg total) by mouth daily. 05/24/20  Yes Charlott Rakes, MD  meloxicam (MOBIC) 15 MG tablet Take 15 mg by mouth daily. 11/02/19  Yes [provider]  rosuvastatin  (CRESTOR) 10 MG tablet TAKE 1 TABLET BY MOUTH EVERY DAY Patient taking differently: Take 10 mg by mouth daily. 12/20/19  Yes Fulp, Cammie, MD  verapamil (VERELAN PM) 180 MG 24 hr capsule Take 1 capsule (180 mg total) by mouth at bedtime. 05/24/20  Yes Newlin, Charlane Ferretti, MD  zolpidem (AMBIEN) 5 MG tablet TAKE 1 TABLET AT BEDTIME AS NEEDED FOR SLEEP DO NOT TAKE ON THE NIGHTS YOU TAKE OTHER MEDS FOR SLEEP Patient taking differently: Take 5 mg by mouth at bedtime as needed for sleep. 10/25/19  Yes Fulp, Cammie, MD  famotidine (PEPCID) 20 MG tablet Take 1 tablet (20 mg total) by mouth 2 (two) times daily. Patient not taking: No sig reported 04/04/20   Kennith Gain, MD  montelukast (SINGULAIR) 10 MG tablet TAKE 1 TABLET BY MOUTH EVERYDAY AT BEDTIME Patient not taking: No sig reported 03/30/20   Kennith Gain, MD  triamcinolone cream (KENALOG) 0.1 % Apply 1 application topically 2 (two) times daily. Patient not taking: Reported on 05/25/2020 07/25/19   Camillia Herter, NP    Physical Exam: Vitals:   05/24/20 2230 05/24/20 2300 05/24/20 2315 05/25/20 0230  BP: (!) 170/95 (!) 158/97 (!) 163/96 135/79  Pulse: 76 78 75 80  Resp: _0 Temp:      TempSrc:      SpO2: 97% 100% 98% 97%    Constitutional: Awake alert and oriented x3, no associated distress.   Skin: no rashes, no lesions, good skin turgor noted. Eyes: Pupils are equally reactive to light.  No evidence of scleral icterus or conjunctival pallor.  ENMT: Moist mucous membranes noted.  Posterior pharynx clear of any exudate or lesions.   Neck: normal, supple, no masses, no thyromegaly.  No evidence of jugular venous distension.   Respiratory: clear to auscultation bilaterally, no wheezing, no crackles. Normal respiratory effort. No accessory muscle use.  Cardiovascular: Regular rate and rhythm, no murmurs / rubs / gallops. No extremity edema. 2+ pedal pulses. No carotid bruits.  Chest:   Nontender without crepitus or  deformity.   Back:   Nontender without crepitus or deformity. Abdomen: Abdomen is soft and nontender.  No evidence of intra-abdominal masses.  Positive bowel sounds noted in all quadrants.   Musculoskeletal: No joint deformity upper and lower extremities. Good ROM, no contractures. Normal muscle tone.  Neurologic: CN 2-12 grossly intact. Sensation intact.  Patient moving all 4 extremities spontaneously.  Patient is following all commands.  Patient is responsive to verbal stimuli.   Psychiatric: Patient exhibits normal mood with appropriate affect.  Patient seems to possess insight as to their current situation.     Labs on Admission: I have personally reviewed following labs and imaging studies -   CBC: Recent Labs  Lab 05/24/20 1922  WBC 3.8*  NEUTROABS 1.9  HGB 10.8*  HCT 33.8*  MCV 88.0  PLT 974   Basic Metabolic Panel: Recent Labs  Lab 05/24/20 1922  NA 136  K 3.6  CL 103  CO2 27  GLUCOSE 87  BUN 11  CREATININE 0.70  CALCIUM 9.1   GFR: CrCl cannot be calculated (Unknown ideal weight.). Liver Function Tests: Recent Labs  Lab 05/24/20 1922  AST 16  ALT 11  ALKPHOS 87  BILITOT 0.5  PROT 7.2  ALBUMIN 3.9   No results for input(s): LIPASE, AMYLASE in the last 168 hours. No results for input(s): AMMONIA in the last 168 hours. Coagulation Profile: No results for input(s): INR, PROTIME in the last 168 hours. Cardiac Enzymes: No results for input(s): CKTOTAL, CKMB, CKMBINDEX, TROPONINI in the last 168 hours. BNP (last 3 results) No results for input(s): PROBNP in the last 8760 hours. HbA1C: No results for input(s): HGBA1C in the last 72 hours. CBG: No results for input(s): GLUCAP in the last 168 hours. Lipid Profile: No results for input(s): CHOL, HDL, LDLCALC, TRIG, CHOLHDL, LDLDIRECT in the last 72 hours. Thyroid Function Tests: No results for input(s): TSH, T4TOTAL, FREET4, T3FREE, THYROIDAB in the last 72 hours. Anemia Panel: No results for input(s):  VITAMINB12, FOLATE, FERRITIN, TIBC, IRON, RETICCTPCT in the last 72 hours. Urine analysis:    Component Value Date/Time   COLORURINE YELLOW 10/23/2012 1006   APPEARANCEUR CLOUDY (A) 10/23/2012 1006   LABSPEC 1.023 10/23/2012 1006   PHURINE 8.0 10/23/2012 1006   GLUCOSEU NEGATIVE 10/23/2012 1006   HGBUR NEGATIVE 10/23/2012 1006   BILIRUBINUR small 06/08/2015 1556   KETONESUR NEGATIVE 10/23/2012 1006   PROTEINUR 30 06/08/2015 1556   PROTEINUR 30 (A) 10/23/2012 1006   UROBILINOGEN >=8.0 06/08/2015 1556   UROBILINOGEN 0.2 10/23/2012 1006   NITRITE negative 06/08/2015 1556   NITRITE NEGATIVE 10/23/2012 1006   LEUKOCYTESUR Negative 06/08/2015 1556    Radiological Exams on Admission - Personally Reviewed: DG Chest 2 View  Result Date: 05/24/2020 CLINICAL DATA:  Chest pain, shortness of breath EXAM: CHEST - 2 VIEW COMPARISON:  None. FINDINGS: The heart is enlarged. Vascular calcifications are seen in the aortic arch. Both lungs are clear. The visualized skeletal structures are unremarkable. IMPRESSION: Cardiomegaly. No acute cardiopulmonary process. Aortic Atherosclerosis (ICD10-I70.0). Electronically Signed   By: Zerita Boers M.D.   On: 05/24/2020 19:25    EKG: Personally reviewed.  Rhythm is normal sinus rhythm with heart rate of 77 bpm.  No dynamic ST segment changes appreciated.  Assessment/Plan Principal Problem:   Chest pain   Patient presenting with a 2-day history of chest discomfort with both typical and atypical features  Fact the patient's chest discomfort occurred at rest and has been is persisting for 2 days with initial troponin being unremarkable suggest that this is likely not secondary to acute coronary syndrome  Furthermore I have ordered the patient a trial dose of GI cocktail here in the emergency department which seems to have dramatically improved patient's chest discomfort suggestive of esophageal spasm.   That being said, considering patient's ongoing history  of uncontrolled hypertension we will monitor patient overnight  Continuing to cycle cardiac enzymes  Due to slightly elevated ESR (which is likely due to Hx of RA) and echocardiogram to ensure there is no evidence of pericardial effusion to suggest pericarditis.  No evidence of this on EKG.  Obtaining D-dimer and will obtain CT angiogram of the chest if elevated  Based on results of above testing will consider obtaining cardiology consultation in the morning.  Placing patient on Protonix daily due to response to GI cocktail.  Active Problems:   Hypertensive urgency   Patient reports running out some of her antihypertensive regimen 5 days ago   Patient was noted to have markedly  elevated blood pressures in her ENT clinic (160's over 110's) on 4/28 and on presentation to the emergency department has blood pressures as high as 188/122 suggestive of mild hypertensive urgency  This is likely secondary to a lack of compliance with her previously prescribed antihypertensive regimen due to running out of some of her medications.  We will start by resuming patient's full antihypertensive regimen and titrate as necessary to achieve excellent blood pressure control  As needed intravenous interventions for markedly elevated blood pressure.    Crohn's disease ([place])   No active symptoms at this time based on my questioning.  Outpatient follow-up.    Mixed hyperlipidemia   Continue home regimen of statin therapy  Obtaining lipid panel    Rheumatoid arthritis [facility])   Outpatient follow-up   Code Status:  Full code Family Communication: deferred   Status is: Observation  The patient remains OBS appropriate and will d/c before 2 midnights.  Dispo: The patient is from: Home              Anticipated d/c is to: Home              Patient currently is not medically stable to d/c.   Difficult to place patient No        Vernelle Emerald MD [facility] Pager [phone]  If 7PM-7AM, please contact night-coverage [email or web address] Use universal [place] password for that web site. If you do not have the password, please call the hospital operator.  05/25/2020, 3:40 AM

## 2020-05-25 NOTE — Progress Notes (Signed)
Lower extremity venous bilateral study completed.  Preliminary results relayed to Ree Kida, DO.   See CV Proc for preliminary results report.   Darlin Coco, RDMS, RVT

## 2020-05-25 NOTE — Consult Note (Addendum)
Cardiology Consultation:   Patient ID: Andrea Wilkins MRN: [id number]; DOB: 1965/09/19  Admit date: 05/24/2020 Date of Consult: 05/25/2020  PCP:  Ladell Pier, MD   [place]  Cardiologist:  New (Dr. Johnsie Cancel) Electrophysiologist:  None   Patient Profile:   Andrea Wilkins is a 55 y.o. female with a history of hypertension, hyperlipidemia, Crohn's disease, and arthritis who is being seen today for the evaluation of chest pain at the request of Dr. Ree Kida.  History of Present Illness:   Andrea Wilkins is a 55 year old female with the above history. No known cardiac history. She has never seen a Cardiologist or had any type of cardiac work-up. She does have known hypertension and hyperlipidemia but no known diabetes. She denies any history of tobacco use.  Chart Dragon.  Patient presented to the ED on 05/24/2020 for further evaluation of chest pain and elevated BP. Patient reports she was in her usual state of health until Wednesday 05/23/2020 when she started to develop chest pain at work. She describes the pain a "tightness" in the center of her chest with occasional brief "sharp" pain that would radiated towards he left shoulder. She initially did not think anything about it and thought it may be reflux. However, it did not improve with burping. Nothing made the pain better or worse. Not worse with exertion. Pain persisted all throughout Wednesday and into the next day. She went to an ENT appointment at Thursday and was noted to have elevated BP of 165/105 (BP usually in the 120s/80s). She had been out of her BP medications for 5 days due to problems getting a refill. She continued to have chest pain all day Thursday and started to have some associated shortness of breath. She ranked the pain as a 7-8/10 on the pain scale at its worse. She also had one episode of [place]/vomiting and feeling presyncopal on Thursday evening. No syncope though. She also  noted being sweaty during episode of vomiting. However, no other diaphoresis. She finally decided to come to the ED for further evaluation. She denies any chest pain prior to this event. 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 She never has an chest pain with this. She does occasionally note some exertional dyspnea if she is over doing but has always attributed this to her weight. No orthopnea or PND. She has chronic lower extremity edema which is well controlled on HCTZ. No recent fevers or illnesses. No abnormal bleeding.  In the ED, patient hypertensive with BP as high as 188/122. Otherwise, vital stable. EKG showed normal sinus rhythm, rate 77 bpm, with no acute ST/T cha events reporting almost nges. High-sensitivity troponin negative x2. BNP normal. Chest x-ray showed cardiomegaly but no acute findings. D-dimer 3.36. Chest CTA negative for PE but did note mosaic attenuation of the pulmonary parenchyma likely related to small airway disease and resultant air trapping. WBC 3.8, Hgb 10.8, Plts 270. Na 136, K 3.6, Glucose 87, BUN 11, Cr 0.70. LFTs normal. Sed Rate 30. COVID-19 testing negative. Patient was restarted on home BP medications and chest pain resolved. Cardiology consulted for further evaluation.  At the time of this evaluation, patient is chest pain free. Her only complaint is a headache. She was given Tylenol for this which has helped some.  Past Medical History:  Diagnosis Date  . Arthritis 2015  . Childhood asthma 10/23/2012  . Crohn's disease ([place])   . Hypertension Dx 2013  . Urticaria  Past Surgical History:  Procedure Laterality Date  . ABDOMINAL HYSTERECTOMY  2008  . BREAST SURGERY  2007  . CESAREAN SECTION  1988  . SMALL INTESTINE SURGERY  2008     Home Medications:  Prior to Admission medications   Medication Sig Start Date End Date Taking? Authorizing Provider  albuterol (VENTOLIN HFA) 108 (90 Base) MCG/ACT inhaler Inhale 2 puffs into the lungs every 4  (four) hours as needed for shortness of breath or wheezing. 02/10/20  Yes [provider]  cetirizine (ZYRTEC ALLERGY) 10 MG tablet Take 1 tablet (10 mg total) by mouth 2 (two) times daily. Patient taking differently: Take 10 mg by mouth daily as needed for allergies. 04/04/20  Yes Padgett, Rae Halsted, MD  hydrochlorothiazide (HYDRODIURIL) 25 MG tablet Take 1 tablet (25 mg total) by mouth daily. Patient taking differently: Take 25 mg by mouth daily as needed (fluid). 11/05/18  Yes Fulp, Cammie, MD  losartan (COZAAR) 50 MG tablet Take 1 tablet (50 mg total) by mouth daily. 05/24/20  Yes Charlott Rakes, MD  meloxicam (MOBIC) 15 MG tablet Take 15 mg by mouth daily. 11/02/19  Yes [provider]  rosuvastatin (CRESTOR) 10 MG tablet TAKE 1 TABLET BY MOUTH EVERY DAY Patient taking differently: Take 10 mg by mouth daily. 12/20/19  Yes Fulp, Cammie, MD  verapamil (VERELAN PM) 180 MG 24 hr capsule Take 1 capsule (180 mg total) by mouth at bedtime. 05/24/20  Yes Newlin, Charlane Ferretti, MD  zolpidem (AMBIEN) 5 MG tablet TAKE 1 TABLET AT BEDTIME AS NEEDED FOR SLEEP DO NOT TAKE ON THE NIGHTS YOU TAKE OTHER MEDS FOR SLEEP Patient taking differently: Take 5 mg by mouth at bedtime as needed for sleep. 10/25/19  Yes Fulp, Cammie, MD  famotidine (PEPCID) 20 MG tablet Take 1 tablet (20 mg total) by mouth 2 (two) times daily. Patient not taking: No sig reported 04/04/20   Kennith Gain, MD  montelukast (SINGULAIR) 10 MG tablet TAKE 1 TABLET BY MOUTH EVERYDAY AT BEDTIME Patient not taking: No sig reported 03/30/20   Kennith Gain, MD  triamcinolone cream (KENALOG) 0.1 % Apply 1 application topically 2 (two) times daily. Patient not taking: Reported on 05/25/2020 07/25/19   Camillia Herter, NP    Inpatient Medications: Scheduled Meds: . enoxaparin (LOVENOX) injection  40 mg Subcutaneous Q24H  . hydrochlorothiazide  25 mg Oral Daily  . losartan  50 mg Oral Daily  . meloxicam  15 mg Oral  Daily  . [START ON 05/26/2020] pantoprazole  40 mg Oral Daily  . rosuvastatin  10 mg Oral Daily  . verapamil  180 mg Oral QHS   Continuous Infusions: . lactated ringers 100 mL/hr at 05/25/20 0425   PRN Meds: acetaminophen, hydrALAZINE, nitroGLYCERIN, ondansetron (ZOFRAN) IV, polyethylene glycol  Allergies:    Allergies  Allergen Reactions  . Lisinopril Cough  . Shellfish Allergy   . Valsartan Hives  . Benadryl Allergy  [Diphenhydramine Hcl (Sleep)] Cough    Social History:   Social History   Socioeconomic History  . Marital status: Single    Spouse name: Not on file  . Number of children: Not on file  . Years of education: Not on file  . Highest education level: Not on file  Occupational History  . Not on file  Tobacco Use  . Smoking status: Never Smoker  . Smokeless tobacco: Never Used  Vaping Use  . Vaping Use: Never used  Substance and Sexual Activity  . Alcohol use: No  .  Drug use: No  . Sexual activity: Yes    Birth control/protection: Surgical  Other Topics Concern  . Not on file  Social History Narrative  . Not on file   Social Determinants of Health   Financial Resource Strain: Not on file  Food Insecurity: Not on file  Transportation Needs: Not on file  Physical Activity: Not on file  Stress: Not on file  Social Connections: Not on file  Intimate Partner Violence: Not on file    Family History:    Family History  Problem Relation Age of Onset  . Hypertension Mother   . Diabetes Sister   . Hypertension Sister   . Diabetes Father      ROS:  Please see the history of present illness.  Review of Systems  Constitutional: Negative for chills and fever.  HENT: Negative for congestion.   Respiratory: Positive for shortness of breath (mild dyspena with exertion). Negative for cough.   Cardiovascular: Positive for chest pain and leg swelling (chronic and stable). Negative for orthopnea and PND.  Gastrointestinal: Positive for nausea and vomiting.  Negative for blood in stool and melena.  Genitourinary: Negative for hematuria.  Musculoskeletal: Negative for myalgias.  Neurological: Negative for loss of consciousness.  Endo/Heme/Allergies: Does not bruise/bleed easily.  Psychiatric/Behavioral: Negative for substance abuse.   Physical Exam/Data:   Vitals:   05/25/20 0530 05/25/20 0600 05/25/20 0700 05/25/20 1000  BP:  (!) 141/80 139/83 132/69  Pulse:  75 78 78  Resp:  13 14 14   Temp: 97.6 F (36.4 C)     TempSrc: Oral     SpO2:  96% 96% 96%   No intake or output data in the 24 hours ending 05/25/20 1145 Last 3 Weights 04/04/2020 09/16/2019 07/25/2019  Weight (lbs) 238 lb 6.4 oz 239 lb 237 lb 12.8 oz  Weight (kg) 108.138 kg 108.41 kg 107.865 kg     There is no height or weight on file to calculate BMI.  General: 55 y.o.  Obese African-American female resting comfortably in no acute distress.  HEENT: Normocephalic and atraumatic. Sclera clear.  Neck: Supple. No carotid bruits. No JVD. Heart: RRR. Distinct S1 and S2. No murmurs, gallops, or rubs. Radial pulses 2+ and equal bilaterally. Lungs: No increased work of breathing. Clear to ausculation bilaterally. No wheezes, rhonchi, or rales.  Abdomen: Soft, non-distended, and non-tender to palpation. Bowel sounds present. Extremities: Trace lower extremity edema bilaterally.    Skin: Warm and dry. Neuro: Alert and oriented x3. No focal deficits. Psych: Normal affect. Responds appropriately.   EKG:  The EKG was personally reviewed and demonstrates: Normal sinus rhythm, rate 77 bpm, with non-specific ST/T changes. Normal axis. Normal PR and QRS intervals. QTc 432 ms. No acute changes compared to prior tracing in 2017.  Telemetry:  Telemetry was personally reviewed and demonstrates:  Sinus rhythm with rates in the 70's to 80's.  Relevant CV Studies: None.  Laboratory Data:  High Sensitivity Troponin:   Recent Labs  Lab 05/24/20 1922 05/24/20 2210 05/25/20 0400  TROPONINIHS 5  5 6      Chemistry Recent Labs  Lab 05/24/20 1922  NA 136  K 3.6  CL 103  CO2 27  GLUCOSE 87  BUN 11  CREATININE 0.70  CALCIUM 9.1  GFRNONAA >60  ANIONGAP 6    Recent Labs  Lab 05/24/20 1922  PROT 7.2  ALBUMIN 3.9  AST 16  ALT 11  ALKPHOS 87  BILITOT 0.5   Hematology Recent Labs  Lab  05/24/20 1922  WBC 3.8*  RBC 3.84*  HGB 10.8*  HCT 33.8*  MCV 88.0  MCH 28.1  MCHC 32.0  RDW 13.2  PLT 270   BNP Recent Labs  Lab 05/24/20 1923  BNP 39.2    DDimer  Recent Labs  Lab 05/24/20 2002  DDIMER 3.36*     Radiology/Studies:  DG Chest 2 View  Result Date: 05/24/2020 CLINICAL DATA:  Chest pain, shortness of breath EXAM: CHEST - 2 VIEW COMPARISON:  None. FINDINGS: The heart is enlarged. Vascular calcifications are seen in the aortic arch. Both lungs are clear. The visualized skeletal structures are unremarkable. IMPRESSION: Cardiomegaly. No acute cardiopulmonary process. Aortic Atherosclerosis (ICD10-I70.0). Electronically Signed   By: Zerita Boers M.D.   On: 05/24/2020 19:25   CT ANGIO CHEST PE W OR WO CONTRAST  Result Date: 05/25/2020 CLINICAL DATA:  Chest pain, elevated D-dimer EXAM: CT ANGIOGRAPHY CHEST WITH CONTRAST TECHNIQUE: Multidetector CT imaging of the chest was performed using the standard protocol during bolus administration of intravenous contrast. Multiplanar CT image reconstructions and MIPs were obtained to evaluate the vascular anatomy. CONTRAST:  46m OMNIPAQUE IOHEXOL 350 MG/ML SOLN COMPARISON:  None. FINDINGS: Cardiovascular: Adequate opacification of the pulmonary arterial tree. No intraluminal filling defect identified to suggest acute pulmonary embolism. The central pulmonary arteries are of normal caliber. No significant coronary artery calcification. Global cardiac size within normal limits. No pericardial effusion. Mild atherosclerotic calcification within the thoracic aorta. No aortic aneurysm. Mediastinum/Nodes: Subcentimeter nodule within  the a visualized left thyroid gland. No pathologic thoracic adenopathy. The esophagus is unremarkable. Lungs/Pleura: Mosaic attenuation of the lungs is noted likely reflecting changes of air trapping related to small airways disease. No superimposed confluent pulmonary infiltrate. No pneumothorax or pleural effusion. The central airways are widely patent. Upper Abdomen: Simple cyst within the right hepatic lobe. No acute abnormality. Musculoskeletal: No lytic or blastic bone lesion. No acute bone abnormality. Review of the MIP images confirms the above findings. IMPRESSION: No pulmonary embolism. Mosaic attenuation of the pulmonary parenchyma likely related to small airways disease and resultant air trapping. Aortic Atherosclerosis (ICD10-I70.0). Electronically Signed   By: AFidela SalisburyMD   On: 05/25/2020 04:35     Assessment and Plan:   Chest Pain - Patient presents with constant chest pain for >24 hours in the setting of hypertensive urgency after running out of her home BP medications 5 days ago. Pain resolved with control of BP. - EKG showed no acute ST/T changes. - High-sensitivity troponin negative x3. - Chest CTA negative for PE. - Suspect this was all do to hypertensive urgency.  No coronary calcifications were noted on chest CTA. Will check Echo just to assess LV function. If normal, no further cardiac work-up needed.  Hypertensive Urgency - BP as high as 188/122 in the ED. Patient ran out of home BP medications 5 days ago. She states her BP is usually in the 120s/80s. BP still elevated but improved. Systolic BP in the 1579Uwhen I was in her room. - Home medications: HCTZ 276mdaily, Losartan 5038maily, and Verapamil 180m5m night (history of migraines).  - Can increase HCTZ or Losartan if needed. May want to increase HCTZ first if needed given chronic lower extremity edema. Will defer to primary team.  Hyperlipidemia - Continue Crestor 10mg78mly.  Otherwise, per primary  team:   Risk Assessment/Risk Scores:   HEAR Score (for undifferentiated chest pain):  HEAR Score: 5{  For questions or updates, please contact [facility]  Please consult [email or web address] for contact info under    Signed, Darreld Mclean, PA-C  05/25/2020 11:45 AM   Patient examined chart reviewed Discussed care with patient and PA. Exam with overweight black female , interstitial lung sounds no murmur Soft abdomen no edema palpable PT bilaterally Atypical pain R/O no acute ECG changes CTA negative for PE, no aortic pathology no pericardial Effusion . Also of note no coronary calcium. No need for ischemic evaluation. TTE ordered she has LVH on CT. Biggest issue is running out of Medication last 6 days and elevated BP. She has f/u June with new Dr at [facility] practice On d/c will need enough medication for 3 months Before she establishes  BP should be fine once she is back on home meds HCTZ, Losartan and verapamil  Jenkins Rouge MD Leonidas Romberg

## 2020-05-25 NOTE — ED Notes (Signed)
Patient transported to CT 

## 2020-05-25 NOTE — Telephone Encounter (Signed)
Pt was instructed to go to ED.

## 2020-05-25 NOTE — Discharge Summary (Signed)
Physician Discharge Summary  Andrea Wilkins DDU:[id number] DOB: [birth year] DOA: 05/24/2020  PCP: Ladell Pier, MD  Admit date: 05/24/2020 Discharge date: 05/25/2020  Time spent: 45 minutes  Recommendations for Outpatient Follow-up:  Patient will be discharged to home.  Patient will need to follow up with primary care provider within one week of discharge.  Patient should continue medications as prescribed.  Patient should follow a heart healthy diet.    Discharge Diagnoses:  Chest Pain Elevated D-dimer Hypertensive urgency Crohn's disease Hyperlipidemia Rheumatoid arthritis  Discharge Condition: Stable  Diet recommendation: heart healthy  There were no vitals filed for this visit.  History of present illness:  On 05/25/2020 by Dr. Inda Merlin 55 year old female with past medical history of Crohn's disease, hypertension, chronic urticaria, hyperlipidemia, Chiari malformation type I, rheumatoid arthritis who presents to [facility] emergency department complaints of chest pain.  Patient explains that on 4/27 the patient began to experience chest discomfort while sitting at her desk at work.  Patient describes the chest discomfort as midsternal in location, nonradiating, 7 out of 10 in intensity and associated with mild shortness of breath.  Patient denies any associated cough, fevers, diaphoresis or new onset leg swelling.  Patient denies any association with exertion.  Patient symptoms continued to persist throughout the afternoon and overnight.  The following day while the patient was continuing to have symptoms she presented to see her ENT provider.  Upon evaluation at ENT clinic she was told that she had extremely high blood pressure.  Of note, patient reports that she ran out of at least one of her antihypertensives approximately 5 days ago.  After leaving her ENT provider's office the patient presented to [facility] emergency department for  evaluation due to ongoing chest discomfort and elevated blood pressures.  Upon evaluation in the emergency department patient was found to have markedly elevated blood pressures as high as 188/122.  Patient also complained of ongoing chest discomfort.  Several sets of cardiac enzymes were obtained.  A chest x-ray was additionally obtained which revealed no evidence of an acute process.  Due to ongoing chest discomfort and markedly elevated blood pressure the hospitalist group was then called to assess the patient for admission to the hospital.  Hospital Course:  Chest Pain -Patient no longer having chest pain, suspect this is secondary to uncontrolled hypertension -High sensitive troponins unremarkable -EKG without acute ST changes -CTA unremarkable for PE -Echocardiogram showed an EF of 60-65%, no RWMA. Moderate LVH. Grade 1 diastolic dysfunction.  -Cardiology consulted and appreciated- if echocardiogram is normal, no need for ischemic eval  Elevated D-dimer -CTA unremarkable for PE -Lower extremity Doppler unremarkable for DVT  Hypertensive urgency -Likely secondary to running out of her medications -Upon admission, blood pressure was as high as 188/122  Crohn's disease -No active symptoms at this time -Continue outpatient follow-up  Hyperlipidemia -Continue statin -Lipid panel: Total cholesterol 235, HDL 67, LDL 159, triglycerides 47  Rheumatoid arthritis -Continue outpatient follow-up  Procedures: Echocardiogram Lower extremity doppler  Consultations: Cardiology  Discharge Exam: Vitals:   05/25/20 1417 05/25/20 1441  BP: 127/85 136/83  Pulse:  88  Resp: 16 16  Temp: 98.5 F (36.9 C) 98 F (36.7 C)  SpO2: 99% 98%     General: Well developed, well nourished, NAD, appears stated age  HEENT: NCAT,  mucous membranes moist.  Neck: Supple  Cardiovascular: S1 S2 auscultated, RRR, no murmur  Respiratory: Clear to auscultation bilaterally with equal chest  rise  Abdomen:  Soft, nontender, nondistended, + bowel sounds  Extremities: warm dry without cyanosis clubbing. Trace LE edema B/L   Neuro: AAOx3, nonfocal  Psych: Normal affect and demeanor with intact judgement and insight  Discharge Instructions Discharge Instructions    Discharge instructions   Complete by: As directed    Patient will be discharged to home.  Patient will need to follow up with primary care provider within one week of discharge.  Patient should continue medications as prescribed.  Patient should follow a heart healthy diet.   Increase activity slowly   Complete by: As directed      Allergies as of 05/25/2020      Reactions   Lisinopril Cough   Shellfish Allergy    Valsartan Hives   Benadryl Allergy  [diphenhydramine Hcl (sleep)] Cough      Medication List    STOP taking these medications   triamcinolone cream 0.1 % Commonly known as: KENALOG     TAKE these medications   albuterol 108 (90 Base) MCG/ACT inhaler Commonly known as: VENTOLIN HFA Inhale 2 puffs into the lungs every 4 (four) hours as needed for shortness of breath or wheezing.   cetirizine 10 MG tablet Commonly known as: ZyrTEC Allergy Take 1 tablet (10 mg total) by mouth 2 (two) times daily. What changed:   when to take this  reasons to take this   famotidine 20 MG tablet Commonly known as: Pepcid Take 1 tablet (20 mg total) by mouth 2 (two) times daily.   hydrochlorothiazide 25 MG tablet Commonly known as: HYDRODIURIL Take 1 tablet (25 mg total) by mouth daily. What changed:   when to take this  reasons to take this   losartan 50 MG tablet Commonly known as: COZAAR Take 1 tablet (50 mg total) by mouth daily.   meloxicam 15 MG tablet Commonly known as: MOBIC Take 15 mg by mouth daily.   montelukast 10 MG tablet Commonly known as: SINGULAIR TAKE 1 TABLET BY MOUTH EVERYDAY AT BEDTIME   rosuvastatin 10 MG tablet Commonly known as: CRESTOR Take 1 tablet (10 mg total)  by mouth daily.   verapamil 180 MG 24 hr capsule Commonly known as: VERELAN PM Take 1 capsule (180 mg total) by mouth at bedtime.   zolpidem 5 MG tablet Commonly known as: AMBIEN Take 1 tablet (5 mg total) by mouth at bedtime as needed for sleep. What changed: See the new instructions.      Allergies  Allergen Reactions  . Lisinopril Cough  . Shellfish Allergy   . Valsartan Hives  . Benadryl Allergy  [Diphenhydramine Hcl (Sleep)] Cough    Follow-up [facility] Follow up on 07/10/2020.   Specialty: Internal Medicine Why: @2 :30 Contact information: [address] [id number] [place] [phone]               The results of significant diagnostics from this hospitalization (including imaging, microbiology, ancillary and laboratory) are listed below for reference.    Significant Diagnostic Studies: DG Chest 2 View  Result Date: 05/24/2020 CLINICAL DATA:  Chest pain, shortness of breath EXAM: CHEST - 2 VIEW COMPARISON:  None. FINDINGS: The heart is enlarged. Vascular calcifications are seen in the aortic arch. Both lungs are clear. The visualized skeletal structures are unremarkable. IMPRESSION: Cardiomegaly. No acute cardiopulmonary process. Aortic Atherosclerosis (ICD10-I70.0). Electronically Signed   By: Zerita Boers M.D.   On: 05/24/2020 19:25   CT ANGIO CHEST PE W OR  WO CONTRAST  Result Date: 05/25/2020 CLINICAL DATA:  Chest pain, elevated D-dimer EXAM: CT ANGIOGRAPHY CHEST WITH CONTRAST TECHNIQUE: Multidetector CT imaging of the chest was performed using the standard protocol during bolus administration of intravenous contrast. Multiplanar CT image reconstructions and MIPs were obtained to evaluate the vascular anatomy. CONTRAST:  23m OMNIPAQUE IOHEXOL 350 MG/ML SOLN COMPARISON:  None. FINDINGS: Cardiovascular: Adequate opacification of the pulmonary arterial tree. No intraluminal filling  defect identified to suggest acute pulmonary embolism. The central pulmonary arteries are of normal caliber. No significant coronary artery calcification. Global cardiac size within normal limits. No pericardial effusion. Mild atherosclerotic calcification within the thoracic aorta. No aortic aneurysm. Mediastinum/Nodes: Subcentimeter nodule within the a visualized left thyroid gland. No pathologic thoracic adenopathy. The esophagus is unremarkable. Lungs/Pleura: Mosaic attenuation of the lungs is noted likely reflecting changes of air trapping related to small airways disease. No superimposed confluent pulmonary infiltrate. No pneumothorax or pleural effusion. The central airways are widely patent. Upper Abdomen: Simple cyst within the right hepatic lobe. No acute abnormality. Musculoskeletal: No lytic or blastic bone lesion. No acute bone abnormality. Review of the MIP images confirms the above findings. IMPRESSION: No pulmonary embolism. Mosaic attenuation of the pulmonary parenchyma likely related to small airways disease and resultant air trapping. Aortic Atherosclerosis (ICD10-I70.0). Electronically Signed   By: AFidela SalisburyMD   On: 05/25/2020 04:35   ECHOCARDIOGRAM COMPLETE  Result Date: 05/25/2020    ECHOCARDIOGRAM REPORT   [facility] Medical Rec #:  [id number]          Height:       63.0 in Accession #:    [id number]         Weight:       238.4 lb Date of Birth:  611/29/1967          BSA:          2.084 m Patient Age:    89years            BP:           136/83 mmHg Patient Gender: F                   HR:           88 bpm. Exam Location:  Inpatient Procedure: 2D Echo, Cardiac Doppler and Color Doppler Indications:    Chest pain  History:        Patient has no prior history of Echocardiogram examinations.                 Signs/Symptoms:Chest Pain; Risk Factors:Hypertension and                 Dyslipidemia.  Sonographer:    AClayton LefortRDCS (AE)  Referring Phys: [id number] 1. Left ventricular ejection fraction, by estimation, is 60 to 65%. The left ventricle has normal function. The left ventricle has no regional wall motion abnormalities. There is moderate concentric left ventricular hypertrophy. Left ventricular diastolic parameters are consistent with Grade I diastolic dysfunction (impaired relaxation). Elevated left ventricular end-diastolic pressure.  2. Right ventricular systolic function is normal. The right ventricular size is normal. There is normal pulmonary artery systolic pressure.  3. The mitral valve is normal in structure. No evidence of mitral valve regurgitation. No evidence of mitral stenosis.  4. The aortic valve is tricuspid. Aortic valve regurgitation is not visualized. No aortic stenosis is  present.  5. The inferior vena cava is normal in size with greater than 50% respiratory variability, suggesting right atrial pressure of 3 mmHg. FINDINGS  Left Ventricle: Left ventricular ejection fraction, by estimation, is 60 to 65%. The left ventricle has normal function. The left ventricle has no regional wall motion abnormalities. The left ventricular internal cavity size was normal in size. There is  moderate concentric left ventricular hypertrophy. Left ventricular diastolic parameters are consistent with Grade I diastolic dysfunction (impaired relaxation). Elevated left ventricular end-diastolic pressure. Right Ventricle: The right ventricular size is normal. No increase in right ventricular wall thickness. Right ventricular systolic function is normal. There is normal pulmonary artery systolic pressure. The tricuspid regurgitant velocity is 2.11 m/s, and  with an assumed right atrial pressure of 3 mmHg, the estimated right ventricular systolic pressure is 93.8 mmHg. Left Atrium: Left atrial size was normal in size. Right Atrium: Right atrial size was normal in size. Pericardium: There is no evidence of pericardial  effusion. Mitral Valve: The mitral valve is normal in structure. No evidence of mitral valve regurgitation. No evidence of mitral valve stenosis. MV peak gradient, 3.3 mmHg. The mean mitral valve gradient is 2.0 mmHg. Tricuspid Valve: The tricuspid valve is normal in structure. Tricuspid valve regurgitation is trivial. No evidence of tricuspid stenosis. Aortic Valve: The aortic valve is tricuspid. Aortic valve regurgitation is not visualized. No aortic stenosis is present. Aortic valve mean gradient measures 3.0 mmHg. Aortic valve peak gradient measures 5.5 mmHg. Aortic valve area, by VTI measures 3.67 cm. Pulmonic Valve: The pulmonic valve was normal in structure. Pulmonic valve regurgitation is not visualized. No evidence of pulmonic stenosis. Aorta: The aortic root is normal in size and structure. Venous: The inferior vena cava is normal in size with greater than 50% respiratory variability, suggesting right atrial pressure of 3 mmHg. IAS/Shunts: No atrial level shunt detected by color flow Doppler.  LEFT VENTRICLE PLAX 2D LVIDd:         4.40 cm  Diastology LVIDs:         2.90 cm  LV e' medial:    4.14 cm/s LV PW:         1.40 cm  LV E/e' medial:  17.1 LV IVS:        1.50 cm  LV e' lateral:   5.03 cm/s LVOT diam:     2.30 cm  LV E/e' lateral: 14.0 LV SV:         78 LV SV Index:   37 LVOT Area:     4.15 cm  RIGHT VENTRICLE             IVC RV Basal diam:  2.00 cm     IVC diam: 1.50 cm RV S prime:     17.00 cm/s TAPSE (M-mode): 1.5 cm LEFT ATRIUM             Index       RIGHT ATRIUM          Index LA diam:        2.30 cm 1.10 cm/m  RA Area:     8.16 cm LA Vol (A2C):   52.2 ml 25.05 ml/m RA Volume:   11.80 ml 5.66 ml/m LA Vol (A4C):   37.7 ml 18.09 ml/m LA Biplane Vol: 44.3 ml 21.26 ml/m  AORTIC VALVE AV Area (Vmax):    3.41 cm AV Area (Vmean):   2.98 cm AV Area (VTI):     3.67 cm AV Vmax:  117.00 cm/s AV Vmean:          85.900 cm/s AV VTI:            0.213 m AV Peak Grad:      5.5 mmHg AV Mean  Grad:      3.0 mmHg LVOT Vmax:         95.90 cm/s LVOT Vmean:        61.700 cm/s LVOT VTI:          0.188 m LVOT/AV VTI ratio: 0.88  AORTA Ao Root diam: 3.40 cm Ao Asc diam:  3.30 cm MITRAL VALVE               TRICUSPID VALVE MV Area (PHT): 4.21 cm    TR Peak grad:   17.8 mmHg MV Area VTI:   3.22 cm    TR Vmax:        211.00 cm/s MV Peak grad:  3.3 mmHg MV Mean grad:  2.0 mmHg    SHUNTS MV Vmax:       0.90 m/s    Systemic VTI:  0.19 m MV Vmean:      58.6 cm/s   Systemic Diam: 2.30 cm MV Decel Time: 180 msec MV E velocity: 70.60 cm/s MV A velocity: 64.80 cm/s MV E/A ratio:  1.09 Skeet Latch MD Electronically signed by Skeet Latch MD Signature Date/Time: 05/25/2020/5:17:09 PM    Final    VAS [place] LOWER EXTREMITY VENOUS (DVT)  Result Date: 05/25/2020  Lower Venous DVT Study Patient Name:  [facility]  Date of Exam:   05/25/2020 Medical Rec #: [id number]            Accession #:    [id number] Date of Birth: 1965/09/01            Patient Gender: F Patient Age:   54Y Exam Location:  [facility] Procedure:      VAS [place] LOWER EXTREMITY VENOUS (DVT) Referring Phys: [id number] [facility] Fareeha Evon --------------------------------------------------------------------------------  Indications: Elevated d-dimer, chest pains.  Comparison Study: No prior studies. Performing Technologist: Darlin Coco RDMS,RVT  Examination Guidelines: A complete evaluation includes B-mode imaging, spectral Doppler, color Doppler, and power Doppler as needed of all accessible portions of each vessel. Bilateral testing is considered an integral part of a complete examination. Limited examinations for reoccurring indications may be performed as noted. The reflux portion of the exam is performed with the patient in reverse Trendelenburg.  +---------+---------------+---------+-----------+----------+--------------+ RIGHT    CompressibilityPhasicitySpontaneityPropertiesThrombus Aging  +---------+---------------+---------+-----------+----------+--------------+ CFV      Full           Yes      Yes                                 +---------+---------------+---------+-----------+----------+--------------+ SFJ      Full                                                        +---------+---------------+---------+-----------+----------+--------------+ FV Prox  Full                                                        +---------+---------------+---------+-----------+----------+--------------+  FV Mid   Full                                                        +---------+---------------+---------+-----------+----------+--------------+ FV DistalFull                                                        +---------+---------------+---------+-----------+----------+--------------+ PFV      Full                                                        +---------+---------------+---------+-----------+----------+--------------+ POP      Full           Yes      Yes                                 +---------+---------------+---------+-----------+----------+--------------+ PTV      Full                                                        +---------+---------------+---------+-----------+----------+--------------+ PERO     Full                                                        +---------+---------------+---------+-----------+----------+--------------+   +---------+---------------+---------+-----------+----------+--------------+ LEFT     CompressibilityPhasicitySpontaneityPropertiesThrombus Aging +---------+---------------+---------+-----------+----------+--------------+ CFV      Full           Yes      Yes                                 +---------+---------------+---------+-----------+----------+--------------+ SFJ      Full                                                         +---------+---------------+---------+-----------+----------+--------------+ FV Prox  Full                                                        +---------+---------------+---------+-----------+----------+--------------+ FV Mid   Full                                                        +---------+---------------+---------+-----------+----------+--------------+  FV DistalFull                                                        +---------+---------------+---------+-----------+----------+--------------+ PFV      Full                                                        +---------+---------------+---------+-----------+----------+--------------+ POP      Full           Yes      Yes                                 +---------+---------------+---------+-----------+----------+--------------+ PTV      Full                                                        +---------+---------------+---------+-----------+----------+--------------+ PERO     Full                                                        +---------+---------------+---------+-----------+----------+--------------+     Summary: RIGHT: - There is no evidence of deep vein thrombosis in the lower extremity.  - No cystic structure found in the popliteal fossa.  LEFT: - There is no evidence of deep vein thrombosis in the lower extremity.  - No cystic structure found in the popliteal fossa.  *See table(s) above for measurements and observations.    Preliminary     Microbiology: Recent Results (from the past 240 hour(s))  SARS CORONAVIRUS 2 (TAT 6-24 HRS) Nasopharyngeal Nasopharyngeal Swab     Status: None   Collection Time: 05/24/20 10:33 PM   Specimen: Nasopharyngeal Swab  Result Value Ref Range Status   SARS Coronavirus 2 NEGATIVE NEGATIVE Final    Comment: (NOTE) SARS-CoV-2 target nucleic acids are NOT DETECTED.  The SARS-CoV-2 RNA is generally detectable in upper and lower respiratory specimens  during the acute phase of infection. Negative results do not preclude SARS-CoV-2 infection, do not rule out co-infections with other pathogens, and should not be used as the sole basis for treatment or other patient management decisions. Negative results must be combined with clinical observations, patient history, and epidemiological information. The expected result is Negative.  Fact Sheet for Patients: SugarRoll.be  Fact Sheet for Healthcare Providers: https://www.woods-mathews.com/  This test is not yet approved or cleared by the [place] FDA and  has been authorized for detection and/or diagnosis of SARS-CoV-2 by FDA under an Emergency Use Authorization (EUA). This EUA will remain  in effect (meaning this test can be used) for the duration of the COVID-19 declaration under Se ction 564(b)(1) of the Act, 21 U.S.C. section 360bbb-3(b)(1), unless the authorization is terminated or revoked sooner.  Performed at [facility], [place] [address],  [place], [place] [address]      Labs: Basic Metabolic Panel: Recent Labs  Lab 05/24/20 1922  NA 136  K 3.6  CL 103  CO2 27  GLUCOSE 87  BUN 11  CREATININE 0.70  CALCIUM 9.1   Liver Function Tests: Recent Labs  Lab 05/24/20 1922  AST 16  ALT 11  ALKPHOS 87  BILITOT 0.5  PROT 7.2  ALBUMIN 3.9   No results for input(s): LIPASE, AMYLASE in the last 168 hours. No results for input(s): AMMONIA in the last 168 hours. CBC: Recent Labs  Lab 05/24/20 1922  WBC 3.8*  NEUTROABS 1.9  HGB 10.8*  HCT 33.8*  MCV 88.0  PLT 270   Cardiac Enzymes: No results for input(s): CKTOTAL, CKMB, CKMBINDEX, TROPONINI in the last 168 hours. BNP: BNP (last 3 results) Recent Labs    05/24/20 1923  BNP 39.2    ProBNP (last 3 results) No results for input(s): PROBNP in the last 8760 hours.  CBG: No results for input(s): GLUCAP in the last 168 hours.     Signed:  Vora Clover  [facility] 05/25/2020, 5:54 PM

## 2020-05-28 ENCOUNTER — Telehealth: Payer: Self-pay

## 2020-05-28 NOTE — Telephone Encounter (Signed)
Transition Care Management Unsuccessful Follow-up Telephone Call  Date of discharge and from where:  05/25/2020, [facility]   Attempts:  1st Attempt  Reason for unsuccessful TCM follow-up call:  Unable to leave message on # [phone]. The recording states to enter mailbox number.    Patient has appointment with Dr Wynetta Emery 07/10/2020. Need to inquire if she would like to be seen sooner by another provider

## 2020-05-29 ENCOUNTER — Telehealth: Payer: Self-pay

## 2020-05-29 NOTE — Telephone Encounter (Signed)
Transition Care Management Follow-up Telephone Call  Date of discharge and from where: 05/25/2020, [facility]   How have you been since you were released from the hospital? She said she is feeling pretty good; but she has no energy. Had recent episode of lightheadedness that just passed over.    Any questions or concerns? Yes - noted above.  Items Reviewed:  Did the pt receive and understand the discharge instructions provided? Yes   Medications obtained and verified? Yes - she said she has all medications and did not have any questions about her med regime   Other? No   Any new allergies since your discharge? No   Do you have support at home? Yes , her husband  [place] and Equipment/Supplies: Were home health services ordered? no If so, what is the name of the agency? n/a  Has the agency set up a time to come to the patient's home? not applicable Were any new equipment or medical supplies ordered?  No What is the name of the medical supply agency? n/a Were you able to get the supplies/equipment? not applicable Do you have any questions related to the use of the equipment or supplies? No  Functional Questionnaire: (I = Independent and D = Dependent) ADLs: independent  Follow up appointments reviewed:   PCP Hospital f/u appt confirmed? Yes  - Dr Wynetta Emery 06/14/2020.    [facility] f/u appt confirmed? No , none scheduled at this time.    Are transportation arrangements needed? No   If their condition worsens, is the pt aware to call PCP or go to the Emergency Dept.? Yes  Was the patient provided with contact information for the PCP's office or ED? Yes  Was to pt encouraged to call back with questions or concerns? Yes

## 2020-06-14 ENCOUNTER — Ambulatory Visit: Payer: BC Managed Care – PPO | Attending: Internal Medicine | Admitting: Internal Medicine

## 2020-06-14 ENCOUNTER — Encounter: Payer: Self-pay | Admitting: Internal Medicine

## 2020-06-14 ENCOUNTER — Other Ambulatory Visit: Payer: Self-pay

## 2020-06-14 VITALS — BP 127/85 | HR 72 | Resp 16 | Ht 63.0 in | Wt 233.0 lb

## 2020-06-14 DIAGNOSIS — E782 Mixed hyperlipidemia: Secondary | ICD-10-CM

## 2020-06-14 DIAGNOSIS — Z9229 Personal history of other drug therapy: Secondary | ICD-10-CM | POA: Insufficient documentation

## 2020-06-14 DIAGNOSIS — Z6841 Body Mass Index (BMI) 40.0 and over, adult: Secondary | ICD-10-CM

## 2020-06-14 DIAGNOSIS — I10 Essential (primary) hypertension: Secondary | ICD-10-CM | POA: Diagnosis not present

## 2020-06-14 DIAGNOSIS — D649 Anemia, unspecified: Secondary | ICD-10-CM

## 2020-06-14 DIAGNOSIS — K5010 Crohn's disease of large intestine without complications: Secondary | ICD-10-CM

## 2020-06-14 NOTE — Progress Notes (Signed)
Patient ID: Andrea Wilkins, female    DOB: [birth year]  MRN: [id number]  CC: Hospitalization Follow-up   Subjective: Andrea Wilkins is a 55 y.o. female who presents for hosp f/u and chronic ds management.  Previous PCP was Dr. Chapman Fitch who is no longer with the practice. Her concerns today include:  Patient with history of HTN, HL, Crohn's disease, obesity, arthritis (listed as RA and psoriatic arthritis on chart), IDA, CHiari malformation type I  Patient hospitalized 4/28-29/2022 with chest pain after being out of blood pressure medications for several days.  Work-up including troponins, EKG and CTA of the chest negative.  Echo showed EF of 60 to 65% with no RWMA.  Restarted on blood pressure medications and discharged home. Today she reports compliance with HCTZ, verapamil and Cozaar.  She limits salt in the foods.  Denies any chest pains, shortness of breath or lower extremity edema since hospital discharge.  Noted to have mild to moderate anemia during hospitalization.  No blood in the stools.  She had a hysterectomy in 2008 due to fibroids.  HL: She is taking and tolerating rosuvastatin.  Obesity: She is wanting to lose weight has had a hard time doing so.  She manages an apartment complex and does a lot of walking up and down 3 flights of stairs during the day.  She has cut out drinking sodas, eating bread and sweets.  Reports having met with a nutritionist in the past.  She loves fruits and vegetables but certain fruits and vegetables will cause flare of her Crohn's disease if she is not careful.  Crohn's disease: Last flare was around 2018.  She is currently not on any medications for it.  Followed by [facility] about once a year.  Reports having had colonoscopy done in 2019. I note diagnosis of rheumatoid and psoriatic arthritis on her chart.  I inquired about this.  Patient states that she sees a rheumatologist named Dr. Luan Pulling.  States that she was told that she does not have  rheumatoid arthritis but something close to it.  She endorses psoriatic arthritis.  Currently on meloxicam  Insomnia: She is on Ambien which she takes as needed when nights when she is restless.  She endorses good sleep hygiene.  She gets in bed around about the same time every night and turns off all lights and sounds.  She does not drink any caffeinated or alcoholic beverages in the evenings.  HM: Last mammogram she reports was 12/26/2019 through [facility] and it was normal.  Last colonoscopy as stated above.  Reports having completed COVID-19 vaccines but does not have her card with her today. Patient Active Problem List   Diagnosis Date Noted  . Hypertensive urgency 05/25/2020  . Mixed hyperlipidemia 05/25/2020  . Chest pain 05/24/2020  . Psoriatic arthritis ([place]) 08/03/2019  . Extensor tenosynovitis of left wrist 06/13/2016  . Ganglion cyst of wrist, left 06/13/2016  . Obesity, morbid, BMI 40.0-49.9 ([place]) 03/14/2016  . Left leg pain 06/08/2015  . Intertrigo 04/02/2015  . Scaly patch rash 04/02/2015  . Macular erythematous rash 04/02/2015  . Tinea corporis 03/05/2015  . Tinea pedis 03/05/2015  . Insomnia 03/05/2015  . Abnormal CT scan, head 12/15/2014  . Chiari malformation type I ([place]) 12/15/2014  . Occipital headache 12/15/2014  . Radicular pain in left arm 12/15/2014  . IDA (iron deficiency anemia) 10/27/2013  . Rheumatoid arthritis ([place]) 10/27/2013  . Crohn's disease ([place]) 10/23/2012  . Childhood asthma 10/23/2012  . Tension  headache 06/14/2012  . Hypertension 06/14/2012     Current Outpatient Medications on File Prior to Visit  Medication Sig Dispense Refill  . albuterol (VENTOLIN HFA) 108 (90 Base) MCG/ACT inhaler Inhale 2 puffs into the lungs every 4 (four) hours as needed for shortness of breath or wheezing.    . cetirizine (ZYRTEC ALLERGY) 10 MG tablet Take 1 tablet (10 mg total) by mouth 2 (two) times daily. (Patient taking differently: Take 10 mg by  mouth daily as needed for allergies.) 60 tablet 5  . famotidine (PEPCID) 20 MG tablet Take 1 tablet (20 mg total) by mouth 2 (two) times daily. (Patient not taking: No sig reported) 60 tablet 5  . hydrochlorothiazide (HYDRODIURIL) 25 MG tablet Take 1 tablet (25 mg total) by mouth daily. 30 tablet 2  . losartan (COZAAR) 50 MG tablet Take 1 tablet (50 mg total) by mouth daily. 30 tablet 2  . meloxicam (MOBIC) 15 MG tablet Take 15 mg by mouth daily.    . montelukast (SINGULAIR) 10 MG tablet TAKE 1 TABLET BY MOUTH EVERYDAY AT BEDTIME (Patient not taking: No sig reported) 30 tablet 0  . rosuvastatin (CRESTOR) 10 MG tablet Take 1 tablet (10 mg total) by mouth daily. 30 tablet 2  . verapamil (VERELAN PM) 180 MG 24 hr capsule Take 1 capsule (180 mg total) by mouth at bedtime. 30 capsule 2  . zolpidem (AMBIEN) 5 MG tablet Take 1 tablet (5 mg total) by mouth at bedtime as needed for sleep. 15 tablet 0   No current facility-administered medications on file prior to visit.    Allergies  Allergen Reactions  . Lisinopril Cough  . Shellfish Allergy   . Valsartan Hives  . Benadryl Allergy  [Diphenhydramine Hcl (Sleep)] Cough    Social History   Socioeconomic History  . Marital status: Single    Spouse name: Not on file  . Number of children: Not on file  . Years of education: Not on file  . Highest education level: Not on file  Occupational History  . Not on file  Tobacco Use  . Smoking status: Never Smoker  . Smokeless tobacco: Never Used  Vaping Use  . Vaping Use: Never used  Substance and Sexual Activity  . Alcohol use: No  . Drug use: No  . Sexual activity: Yes    Birth control/protection: Surgical  Other Topics Concern  . Not on file  Social History Narrative  . Not on file   Social Determinants of Health   Financial Resource Strain: Not on file  Food Insecurity: Not on file  Transportation Needs: Not on file  Physical Activity: Not on file  Stress: Not on file  Social  Connections: Not on file  Intimate Partner Violence: Not on file    Family History  Problem Relation Age of Onset  . Hypertension Mother   . Diabetes Sister   . Hypertension Sister   . Diabetes Father     Past Surgical History:  Procedure Laterality Date  . ABDOMINAL HYSTERECTOMY  2008  . BREAST SURGERY  2007  . CESAREAN SECTION  1988  . SMALL INTESTINE SURGERY  2008    ROS: Review of Systems Negative except as stated above  PHYSICAL EXAM: BP 127/85   Pulse 72   Resp 16   Ht 5' 3"  (1.6 m)   Wt 233 lb (105.7 kg)   SpO2 98%   BMI 41.27 kg/m   Wt Readings from Last 3 Encounters:  06/14/20 233 lb (  105.7 kg)  04/04/20 238 lb 6.4 oz (108.1 kg)  09/16/19 239 lb (108.4 kg)    Physical Exam  General appearance - alert, well appearing, middle-aged African-American female and in no distress Mental status - normal mood, behavior, speech, dress, motor activity, and thought processes Neck - supple, no significant adenopathy Chest - clear to auscultation, no wheezes, rales or rhonchi, symmetric air entry Heart - normal rate, regular rhythm, normal S1, S2, no murmurs, rubs, clicks or gallops Extremities - peripheral pulses normal, no pedal edema, no clubbing or cyanosis   CMP Latest Ref Rng & Units 05/24/2020 09/16/2019 05/16/2019  Glucose 70 - 99 mg/dL 87 90 81  BUN 6 - 20 mg/dL 11 12 9   Creatinine 0.44 - 1.00 mg/dL 0.70 0.68 0.68  Sodium 135 - 145 mmol/L 136 140 138  Potassium 3.5 - 5.1 mmol/L 3.6 4.7 4.0  Chloride 98 - 111 mmol/L 103 102 100  CO2 22 - 32 mmol/L 27 25 21   Calcium 8.9 - 10.3 mg/dL 9.1 9.5 9.5  Total Protein 6.5 - 8.1 g/dL 7.2 7.2 7.1  Total Bilirubin 0.3 - 1.2 mg/dL 0.5 0.3 0.4  Alkaline Phos 38 - 126 U/L 87 105 99  AST 15 - 41 U/L 16 22 21   ALT 0 - 44 U/L 11 15 15    Lipid Panel     Component Value Date/Time   CHOL 235 (H) 05/25/2020 0400   CHOL 185 05/16/2019 1637   TRIG 47 05/25/2020 0400   HDL 67 05/25/2020 0400   HDL 77 05/16/2019 1637    CHOLHDL 3.5 05/25/2020 0400   VLDL 9 05/25/2020 0400   LDLCALC 159 (H) 05/25/2020 0400   LDLCALC 95 05/16/2019 1637    CBC    Component Value Date/Time   WBC 3.8 (L) 05/24/2020 1922   RBC 3.84 (L) 05/24/2020 1922   HGB 10.8 (L) 05/24/2020 1922   HGB 12.2 09/16/2019 1509   HCT 33.8 (L) 05/24/2020 1922   HCT 36.9 09/16/2019 1509   PLT 270 05/24/2020 1922   PLT 247 10/23/2017 1133   MCV 88.0 05/24/2020 1922   MCV 89 09/16/2019 1509   MCH 28.1 05/24/2020 1922   MCHC 32.0 05/24/2020 1922   RDW 13.2 05/24/2020 1922   RDW 12.9 09/16/2019 1509   LYMPHSABS 1.5 05/24/2020 1922   LYMPHSABS 1.5 09/16/2019 1509   MONOABS 0.3 05/24/2020 1922   EOSABS 0.1 05/24/2020 1922   EOSABS 0.2 09/16/2019 1509   BASOSABS 0.0 05/24/2020 1922   BASOSABS 0.0 09/16/2019 1509    ASSESSMENT AND PLAN: 1. Essential hypertension At goal.  Continue hydrochlorothiazide, verapamil and Cozaar.  Continue low-salt diet.  2. Normocytic anemia Recheck CBC today along with iron studies. - CBC - Iron, TIBC and Ferritin Panel  3. Class 3 severe obesity due to excess calories with serious comorbidity and body mass index (BMI) of 40.0 to 44.9 in adult [facility]) Discussed and encourage healthy eating habits.  She is considering having weight reduction surgery.  Encouraged her to stay active.  She does have a smart watch.  Encouraged her to track her total daily steps and try to get up to [phone] a day. Printed information given on healthy eating habits.  4. Mixed hyperlipidemia Continue Crestor.  5. COVID-19 vaccine series completed Request that she brings her vaccine card with her on next visit for me to update her health maintenance  6. Crohn's disease of large intestine without complication ([place]) I requested that she signs a release for [place]  to get copy of her last colonoscopy report from her gastroenterologist.  Request patient signed a release for [place] to get copy of her mammogram report from her OB/GYN and notes  from her rheumatologist.     Patient was given the opportunity to ask questions.  Patient verbalized understanding of the plan and was able to repeat key elements of the plan.   No orders of the defined types were placed in this encounter.    Requested Prescriptions    No prescriptions requested or ordered in this encounter    No follow-ups on file.  Karle Plumber, MD, FACP

## 2020-06-14 NOTE — Patient Instructions (Signed)
Please sign a release for me to get notes from your rheumatologist Dr. Luan Pulling.  I request notes from the last 2 visits. Please sign a release for [place] to get a copy of your mammogram report from [place]. Please sign a release for [place] to get your colonoscopy report from your gastroenterologist.  Healthy Eating Following a healthy eating pattern may help you to achieve and maintain a healthy body weight, reduce the risk of chronic disease, and live a long and productive life. It is important to follow a healthy eating pattern at an appropriate calorie level for your body. Your nutritional needs should be met primarily through food by choosing a variety of nutrient-rich foods. What are tips for following this plan? Reading food labels  Read labels and choose the following: ? Reduced or low sodium. ? Juices with 100% fruit juice. ? Foods with low saturated fats and high polyunsaturated and monounsaturated fats. ? Foods with whole grains, such as whole wheat, cracked wheat, brown rice, and wild rice. ? Whole grains that are fortified with folic acid. This is recommended for women who are pregnant or who want to become pregnant.  Read labels and avoid the following: ? Foods with a lot of added sugars. These include foods that contain brown sugar, corn sweetener, corn syrup, dextrose, fructose, glucose, high-fructose corn syrup, honey, invert sugar, lactose, malt syrup, maltose, molasses, raw sugar, sucrose, trehalose, or turbinado sugar.  Do not eat more than the following amounts of added sugar per day:  6 teaspoons (25 g) for women.  9 teaspoons (38 g) for men. ? Foods that contain processed or refined starches and grains. ? Refined grain products, such as white flour, degermed cornmeal, white bread, and white rice. Shopping  Choose nutrient-rich snacks, such as vegetables, whole fruits, and nuts. Avoid high-calorie and high-sugar snacks, such as potato chips, fruit snacks, and  candy.  Use oil-based dressings and spreads on foods instead of solid fats such as butter, stick margarine, or cream cheese.  Limit pre-made sauces, mixes, and "instant" products such as flavored rice, instant noodles, and ready-made pasta.  Try more plant-protein sources, such as tofu, tempeh, black beans, edamame, lentils, nuts, and seeds.  Explore eating plans such as the Mediterranean diet or vegetarian diet. Cooking  Use oil to saut or stir-fry foods instead of solid fats such as butter, stick margarine, or lard.  Try baking, boiling, grilling, or broiling instead of frying.  Remove the fatty part of meats before cooking.  Steam vegetables in water or broth. Meal planning  At meals, imagine dividing your plate into fourths: ? One-half of your plate is fruits and vegetables. ? One-fourth of your plate is whole grains. ? One-fourth of your plate is protein, especially lean meats, poultry, eggs, tofu, beans, or nuts.  Include low-fat dairy as part of your daily diet.   Lifestyle  Choose healthy options in all settings, including home, work, school, restaurants, or stores.  Prepare your food safely: ? Wash your hands after handling raw meats. ? Keep food preparation surfaces clean by regularly washing with hot, soapy water. ? Keep raw meats separate from ready-to-eat foods, such as fruits and vegetables. ? Cook seafood, meat, poultry, and eggs to the recommended internal temperature. ? Store foods at safe temperatures. In general:  Keep cold foods at 47F (4.4C) or below.  Keep hot foods at 147F (60C) or above.  Keep your freezer at [facility] (-17.8C) or below.  Foods are no longer safe to  eat when they have been between the temperatures of 40-140F (4.4-60C) for more than 2 hours. What foods should I eat? Fruits Aim to eat 2 cup-equivalents of fresh, canned (in natural juice), or frozen fruits each day. Examples of 1 cup-equivalent of fruit include 1 small apple, 8  large strawberries, 1 cup canned fruit,  cup dried fruit, or 1 cup 100% juice. Vegetables Aim to eat 2-3 cup-equivalents of fresh and frozen vegetables each day, including different varieties and colors. Examples of 1 cup-equivalent of vegetables include 2 medium carrots, 2 cups raw, leafy greens, 1 cup chopped vegetable (raw or cooked), or 1 medium baked potato. Grains Aim to eat 6 ounce-equivalents of whole grains each day. Examples of 1 ounce-equivalent of grains include 1 slice of bread, 1 cup ready-to-eat cereal, 3 cups popcorn, or  cup cooked rice, pasta, or cereal. Meats and other proteins Aim to eat 5-6 ounce-equivalents of protein each day. Examples of 1 ounce-equivalent of protein include 1 egg, 1/2 cup nuts or seeds, or 1 tablespoon (16 g) peanut butter. A cut of meat or fish that is the size of a deck of cards is about 3-4 ounce-equivalents.  Of the protein you eat each week, try to have at least 8 ounces come from seafood. This includes salmon, trout, herring, and anchovies. Dairy Aim to eat 3 cup-equivalents of fat-free or low-fat dairy each day. Examples of 1 cup-equivalent of dairy include 1 cup (240 mL) milk, 8 ounces (250 g) yogurt, 1 ounces (44 g) natural cheese, or 1 cup (240 mL) fortified soy milk. Fats and oils  Aim for about 5 teaspoons (21 g) per day. Choose monounsaturated fats, such as canola and olive oils, avocados, peanut butter, and most nuts, or polyunsaturated fats, such as sunflower, corn, and soybean oils, walnuts, pine nuts, sesame seeds, sunflower seeds, and flaxseed. Beverages  Aim for six 8-oz glasses of water per day. Limit coffee to three to five 8-oz cups per day.  Limit caffeinated beverages that have added calories, such as soda and energy drinks.  Limit alcohol intake to no more than 1 drink a day for nonpregnant women and 2 drinks a day for men. One drink equals 12 oz of beer (355 mL), 5 oz of wine (148 mL), or 1 oz of hard liquor (44  mL). Seasoning and other foods  Avoid adding excess amounts of salt to your foods. Try flavoring foods with herbs and spices instead of salt.  Avoid adding sugar to foods.  Try using oil-based dressings, sauces, and spreads instead of solid fats. This information is based on general [place] nutrition guidelines. For more information, visit [employer]. Exact amounts may vary based on your nutrition needs. Summary  A healthy eating plan may help you to maintain a healthy weight, reduce the risk of chronic diseases, and stay active throughout your life.  Plan your meals. Make sure you eat the right portions of a variety of nutrient-rich foods.  Try baking, boiling, grilling, or broiling instead of frying.  Choose healthy options in all settings, including home, work, school, restaurants, or stores. This information is not intended to replace advice given to you by your health care provider. Make sure you discuss any questions you have with your health care provider. Document Revised: 04/27/2017 Document Reviewed: 04/27/2017 Elsevier Patient Education  [place].

## 2020-06-15 LAB — CBC
Hematocrit: 34.6 % (ref 34.0–46.6)
Hemoglobin: 11.1 g/dL (ref 11.1–15.9)
MCH: 28 pg (ref 26.6–33.0)
MCHC: 32.1 g/dL (ref 31.5–35.7)
MCV: 87 fL (ref 79–97)
Platelets: 270 10*3/uL (ref 150–450)
RBC: 3.97 x10E6/uL (ref 3.77–5.28)
RDW: 13 % (ref 11.7–15.4)
WBC: 5.5 10*3/uL (ref 3.4–10.8)

## 2020-06-15 LAB — IRON,TIBC AND FERRITIN PANEL
Ferritin: 112 ng/mL (ref 15–150)
Iron Saturation: 16 % (ref 15–55)
Iron: 40 ug/dL (ref 27–159)
Total Iron Binding Capacity: 257 ug/dL (ref 250–450)
UIBC: 217 ug/dL (ref 131–425)

## 2020-06-16 NOTE — Progress Notes (Signed)
Let patient know that her blood cell count has improved but still slightly anemic.  This looks more like anemia that is associated with certain chronic medical conditions including her having Crohn's disease.  However no need for iron supplement at this time.

## 2020-06-18 ENCOUNTER — Telehealth: Payer: Self-pay

## 2020-06-18 NOTE — Telephone Encounter (Signed)
Contacted pt to go over lab results pt is aware and doesn't have any questions or concerns 

## 2020-07-10 ENCOUNTER — Inpatient Hospital Stay: Payer: BC Managed Care – PPO | Admitting: Internal Medicine

## 2020-07-19 ENCOUNTER — Ambulatory Visit: Payer: BC Managed Care – PPO | Admitting: Internal Medicine

## 2020-08-22 ENCOUNTER — Other Ambulatory Visit: Payer: Self-pay | Admitting: Internal Medicine

## 2020-08-22 DIAGNOSIS — G4700 Insomnia, unspecified: Secondary | ICD-10-CM

## 2020-08-22 DIAGNOSIS — I10 Essential (primary) hypertension: Secondary | ICD-10-CM

## 2020-08-22 DIAGNOSIS — E785 Hyperlipidemia, unspecified: Secondary | ICD-10-CM

## 2020-08-22 MED ORDER — ROSUVASTATIN CALCIUM 10 MG PO TABS: 10.0000 mg | ORAL_TABLET | Freq: Every day | ORAL | 2 refills | Status: DC

## 2020-08-22 MED ORDER — VERAPAMIL HCL ER 180 MG PO CP24
180.0000 mg | ORAL_CAPSULE | Freq: Every day | ORAL | 2 refills | Status: DC
Start: 2020-08-22 — End: 2020-11-17

## 2020-08-22 NOTE — Telephone Encounter (Signed)
Requested Prescriptions  Pending Prescriptions Disp Refills  . rosuvastatin (CRESTOR) 10 MG tablet 30 tablet 2    Sig: Take 1 tablet (10 mg total) by mouth daily.     Cardiovascular:  Antilipid - Statins Failed - 08/22/2020  8:17 PM      Failed - Total Cholesterol in normal range and within 360 days    Cholesterol, Total  Date Value Ref Range Status  05/16/2019 185 100 - 199 mg/dL Final   Cholesterol  Date Value Ref Range Status  05/25/2020 235 (H) 0 - 200 mg/dL Final         Failed - LDL in normal range and within 360 days    LDL Chol Calc (NIH)  Date Value Ref Range Status  05/16/2019 95 0 - 99 mg/dL Final   LDL Cholesterol  Date Value Ref Range Status  05/25/2020 159 (H) 0 - 99 mg/dL Final    Comment:           Total Cholesterol/HDL:CHD Risk Coronary Heart Disease Risk Table                     Men   Women  1/2 Average Risk   3.4   3.3  Average Risk       5.0   4.4  2 X Average Risk   9.6   7.1  3 X Average Risk  23.4   11.0        Use the calculated Patient Ratio above and the CHD Risk Table to determine the patient's CHD Risk.        ATP III CLASSIFICATION (LDL):  <100     mg/dL   Optimal  100-129  mg/dL   Near or Above                    Optimal  130-159  mg/dL   Borderline  160-189  mg/dL   High  >190     mg/dL   Very High Performed at [facility] [address] [place], [place] [address]          Passed - HDL in normal range and within 360 days    HDL  Date Value Ref Range Status  05/25/2020 67 >40 mg/dL Final  05/16/2019 77 >39 mg/dL Final         Passed - Triglycerides in normal range and within 360 days    Triglycerides  Date Value Ref Range Status  05/25/2020 47 <150 mg/dL Final         Passed - Patient is not pregnant      Passed - Valid encounter within last 12 months    Recent Outpatient Visits          2 months ago Essential hypertension   [place], Deborah B, MD   1 year ago  Essential hypertension   [place], [place], NP   1 year ago Essential hypertension   [place], Jarome Matin, RPH-CPP   1 year ago Hyperlipidemia, unspecified hyperlipidemia type   [place], Jarome Matin, RPH-CPP   1 year ago Essential hypertension   [place], Jarome Matin, RPH-CPP      Future Appointments            In 1 month [place], Rae Halsted, MD Allergy  and [place]   In 1 month Wynetta Emery, Dalbert Batman, MD [place]           . zolpidem (AMBIEN) 5 MG tablet 15 tablet 0    Sig: Take 1 tablet (5 mg total) by mouth at bedtime as needed for sleep.     Not Delegated - Psychiatry:  Anxiolytics/Hypnotics Failed - 08/22/2020  8:17 PM      Failed - This refill cannot be delegated      Failed - Urine Drug Screen completed in last 360 days      Passed - Valid encounter within last 6 months    Recent Outpatient Visits          2 months ago Essential hypertension   [place], Deborah B, MD   1 year ago Essential hypertension   [place], [place], NP   1 year ago Essential hypertension   [place], Stephen L, RPH-CPP   1 year ago Hyperlipidemia, unspecified hyperlipidemia type   [place], Jarome Matin, RPH-CPP   1 year ago Essential hypertension   [place], [place], RPH-CPP      Future Appointments            In 1 month Padgett, Rae Halsted, MD Allergy and [place]   In 1 month Ladell Pier, MD [place]           . verapamil (VERELAN PM) 180 MG 24 hr capsule 30 capsule 2    Sig: Take 1 capsule  (180 mg total) by mouth at bedtime.     Cardiovascular:  Calcium Channel Blockers Passed - 08/22/2020  8:17 PM      Passed - Last BP in normal range    BP Readings from Last 1 Encounters:  06/14/20 127/85         Passed - Valid encounter within last 6 months    Recent Outpatient Visits          2 months ago Essential hypertension   [place], Deborah B, MD   1 year ago Essential hypertension   [place], [place], NP   1 year ago Essential hypertension   [place], Stephen L, RPH-CPP   1 year ago Hyperlipidemia, unspecified hyperlipidemia type   [place], Jarome Matin, RPH-CPP   1 year ago Essential hypertension   [place], RPH-CPP      Future Appointments            In 1 month Padgett, Rae Halsted, MD Allergy and [place]   In 1 month Ladell Pier, MD [place]

## 2020-08-22 NOTE — Telephone Encounter (Signed)
Patient requesting a 90 day supply of verapamil (VERELAN PM) 180 MG 24 hr capsule, rosuvastatin (CRESTOR) 10 MG tablet, zolpidem (AMBIEN) 5 MG tablet. Patient contacted pharmacy today and was advised they would reach out to PCP office, Patient states she will run out.    [id number] [place] [place] [address] [place] Phone:  [phone] Fax:  [phone]

## 2020-08-22 NOTE — Telephone Encounter (Signed)
Requested medications are due for refill today.  yes  Requested medications are on the active medications list.  yes  Last refill. 05/25/2020  Future visit scheduled.   yes  Notes to clinic.  Medication not delegated.

## 2020-08-23 ENCOUNTER — Other Ambulatory Visit: Payer: Self-pay | Admitting: Internal Medicine

## 2020-08-23 DIAGNOSIS — E785 Hyperlipidemia, unspecified: Secondary | ICD-10-CM

## 2020-08-23 DIAGNOSIS — I10 Essential (primary) hypertension: Secondary | ICD-10-CM

## 2020-08-23 MED ORDER — ZOLPIDEM TARTRATE 5 MG PO TABS: 5.0000 mg | ORAL_TABLET | Freq: Every evening | ORAL | 0 refills | Status: DC | PRN

## 2020-10-03 ENCOUNTER — Other Ambulatory Visit: Payer: Self-pay

## 2020-10-03 ENCOUNTER — Ambulatory Visit (INDEPENDENT_AMBULATORY_CARE_PROVIDER_SITE_OTHER): Payer: BC Managed Care – PPO | Admitting: Family Medicine

## 2020-10-03 ENCOUNTER — Encounter: Payer: Self-pay | Admitting: Family Medicine

## 2020-10-03 VITALS — BP 130/80 | HR 76 | Temp 98.8°F | Resp 16 | Ht 63.0 in | Wt 238.5 lb

## 2020-10-03 DIAGNOSIS — R0609 Other forms of dyspnea: Secondary | ICD-10-CM | POA: Insufficient documentation

## 2020-10-03 DIAGNOSIS — L508 Other urticaria: Secondary | ICD-10-CM | POA: Diagnosis not present

## 2020-10-03 DIAGNOSIS — R0602 Shortness of breath: Secondary | ICD-10-CM | POA: Diagnosis not present

## 2020-10-03 MED ORDER — ALBUTEROL SULFATE HFA 108 (90 BASE) MCG/ACT IN AERS: 2.0000 | INHALATION_SPRAY | RESPIRATORY_TRACT | 1 refills | Status: AC | PRN

## 2020-10-03 NOTE — Patient Instructions (Signed)
Chronic urticaria Take the least amount of medications while remaining hive free Cetirizine (Zyrtec) 59m twice a day and famotidine (Pepcid) 20 mg twice a day. If no symptoms for 7-14 days then decrease to. Cetirizine (Zyrtec) 171mtwice a day and famotidine (Pepcid) 20 mg once a day.  If no symptoms for 7-14 days then decrease to. Cetirizine (Zyrtec) 1051mwice a day.  If no symptoms for 7-14 days then decrease to. Cetirizine (Zyrtec) 79m35mce a day.  May use Benadryl (diphenhydramine) as needed for breakthrough hives       If symptoms return, then step up dosage Keep a detailed symptom journal including foods eaten, contact with allergens, medications taken, weather changes.    Shortness of breath Begin albuterol 2 puffs once every 4 hours as needed for cough or wheeze You may use albuterol 2 puffs 5 to 15 minutes before activity to decrease cough or wheeze  Call the clinic if this treatment plan is not working well for you.  Follow up in 3 months or sooner if needed.

## 2020-10-03 NOTE — Progress Notes (Signed)
[place] [address] Dept: [phone]  FOLLOW UP NOTE  Patient ID: Andrea Wilkins, female    DOB: 12-04-1965  Age: 55 y.o. MRN: [id number] Date of Office Visit: 10/03/2020  Assessment  Chief Complaint: Follow-up  HPI Andrea Wilkins is a 55 year old female who presents to the clinic for follow-up visit.  She was last seen in this clinic on 04/04/2020 by Dr. Nelva Bush for evaluation of chronic urticaria.  Her past medical history is significant for rheumatoid arthritis and Crohn's disease.  At today's visit, she reports that her urticaria has been moderately well controlled with breakouts occurring daily at 5 PM and resolving by 6 AM.  She reports these initially started on her arms only and now have begun to affect all parts of her body.  She continues cetirizine 10 mg once at night and famotidine 10 mg once at night.  She has not tried to increase her antihistamine use at this time.  She reports that she has not tried montelukast in the past.  She is not interested in Xolair as a treatment option at this time.  She does report a new symptom of shortness of breath with activity and while lying down.  She denies wheezing and coughing with activity or rest.  She does have a history of childhood asthma, however, is not using any medications for asthma at this time.  She denies symptoms of reflux including heartburn and vomiting.  She continues famotidine 20 mg once a day as part of her urticaria treatment.  Her current medications are listed in the chart.   Drug Allergies:  Allergies  Allergen Reactions   Lisinopril Cough   Shellfish Allergy    Valsartan Hives   Benadryl Allergy  [Diphenhydramine Hcl (Sleep)] Cough    Physical Exam: BP 130/80   Pulse 76   Temp 98.8 F (37.1 C) (Temporal)   Resp 16   Ht 5' 3"  (1.6 m)   Wt 238 lb 8 oz (108.2 kg)   SpO2 98%   BMI 42.25 kg/m    Physical Exam Vitals reviewed.  Constitutional:      Appearance: Normal  appearance.  HENT:     Head: Normocephalic and atraumatic.     Right Ear: Tympanic membrane normal.     Left Ear: Tympanic membrane normal.     Nose:     Comments: Bilateral nares slightly erythematous with clear nasal drainage noted.  Pharynx normal.  Ears normal.  Eyes normal.    Mouth/Throat:     Pharynx: Oropharynx is clear.  Eyes:     Conjunctiva/sclera: Conjunctivae normal.  Cardiovascular:     Rate and Rhythm: Normal rate and regular rhythm.     Heart sounds: Normal heart sounds. No murmur heard. Pulmonary:     Effort: Pulmonary effort is normal.     Breath sounds: Normal breath sounds.     Comments: Lungs clear to auscultation Musculoskeletal:        General: Normal range of motion.     Cervical back: Normal range of motion and neck supple.  Skin:    General: Skin is warm and dry.     Comments: No hives or rash noted during the visit  Neurological:     Mental Status: She is alert and oriented to person, place, and time.  Psychiatric:        Mood and Affect: Mood normal.        Behavior: Behavior normal.        Thought  Content: Thought content normal.        Judgment: Judgment normal.    Diagnostics: FVC 1.65, FEV1 1.49.  Predicted FVC 2.72, predicted FEV1 2.17.  Spirometry indicates moderate restriction.  Postbronchodilator FVC 1.59, FEV1 1.43.  Postbronchodilator spirometry indicates mild restriction with no significant bronchodilator response.  Assessment and Plan: 1. Chronic urticaria   2. Shortness of breath     Meds ordered this encounter  Medications   albuterol (VENTOLIN HFA) 108 (90 Base) MCG/ACT inhaler    Sig: Inhale 2 puffs into the lungs every 4 (four) hours as needed for shortness of breath or wheezing.    Dispense:  1 each    Refill:  1     Patient Instructions  Chronic urticaria Take the least amount of medications while remaining hive free Cetirizine (Zyrtec) 22m twice a day and famotidine (Pepcid) 20 mg twice a day. If no symptoms for  7-14 days then decrease to. Cetirizine (Zyrtec) 130mtwice a day and famotidine (Pepcid) 20 mg once a day.  If no symptoms for 7-14 days then decrease to. Cetirizine (Zyrtec) 1021mwice a day.  If no symptoms for 7-14 days then decrease to. Cetirizine (Zyrtec) 42m142mce a day.  May use Benadryl (diphenhydramine) as needed for breakthrough hives       If symptoms return, then step up dosage Keep a detailed symptom journal including foods eaten, contact with allergens, medications taken, weather changes.    Shortness of breath Begin albuterol 2 puffs once every 4 hours as needed for cough or wheeze You may use albuterol 2 puffs 5 to 15 minutes before activity to decrease cough or wheeze  Call the clinic if this treatment plan is not working well for you.  Follow up in 3 months or sooner if needed.  Return in about 3 months (around 01/02/2021), or if symptoms worsen or fail to improve.    Thank you for the opportunity to care for this patient.  Please do not hesitate to contact me with questions.  AnneGareth MorganP [facility]

## 2020-10-04 ENCOUNTER — Ambulatory Visit: Payer: BC Managed Care – PPO | Admitting: Allergy

## 2020-10-05 NOTE — Addendum Note (Signed)
Addended by: Larence Penning on: 10/05/2020 09:23 AM   Modules accepted: Orders

## 2020-10-15 ENCOUNTER — Other Ambulatory Visit: Payer: Self-pay

## 2020-10-15 ENCOUNTER — Ambulatory Visit: Payer: BC Managed Care – PPO | Attending: Internal Medicine | Admitting: Internal Medicine

## 2020-10-15 DIAGNOSIS — Z8616 Personal history of COVID-19: Secondary | ICD-10-CM

## 2020-10-15 DIAGNOSIS — I10 Essential (primary) hypertension: Secondary | ICD-10-CM | POA: Diagnosis not present

## 2020-10-15 DIAGNOSIS — E782 Mixed hyperlipidemia: Secondary | ICD-10-CM | POA: Diagnosis not present

## 2020-10-15 DIAGNOSIS — Z2821 Immunization not carried out because of patient refusal: Secondary | ICD-10-CM

## 2020-10-15 DIAGNOSIS — J4520 Mild intermittent asthma, uncomplicated: Secondary | ICD-10-CM | POA: Insufficient documentation

## 2020-10-15 DIAGNOSIS — Z6841 Body Mass Index (BMI) 40.0 and over, adult: Secondary | ICD-10-CM

## 2020-10-15 NOTE — Progress Notes (Signed)
Virtual Visit via Video Note  I connected with Andrea Wilkins on 10/15/20 at 11:25 a.m by a video enabled telemedicine application and verified that I am speaking with the correct person using two identifiers.  Location: Patient: car, parked Provider: offcie   I discussed the limitations of evaluation and management by telemedicine and the availability of in person appointments. The patient expressed understanding and agreed to proceed.  History of Present Illness: Patient with history of HTN, HL, Crohn's disease (followed by [facility] once a yr), obesity, arthritis (psoriatic arthritis rheumatologist Dr. Luan Pulling), ACD, CHiari malformation type I, insomnia.  Last eval 05/2020.  Today's visit is for chronic disease management.  She reports having COVID in July.  This caused a flare of asthma.  Hx of childhood asthma for which she has not had a flare since age 67.  She feels she has recovered from [place] except she is left with decreased energy and having to use albuterol inhaler at least twice a day.  She reports wheezing and shortness of breath every now and then.  She manages an apartment complex.  Whenever she has to get out and walk the complex during the day, she takes a puff of the albuterol inhaler before and after the walk.  Denies any cough.  She has had 4 COVID 19 vaccines already.  Wanting to try medication to help with weight loss.  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  She continues to limit consumption of white foods.  She does not drink any sugary drinks and has cut out fried foods completely.  HTN: She checks blood pressure about twice a week.  States range has been 120s/81-82.  She limits salt in the foods.  Denies any chest pains or lower extremity edema.  Compliant with HCTZ, verapamil and Cozaar.  HL: Compliant with Crestor.  Crohn's disease: Denies any recent flares.  She is not on any medications.  HM: Due for flu vaccine.  She  declines.  I have not received report of the colonoscopy that she states that she had done in 2019 by her gastroenterologist.  Patient states she will contact them again about getting a copy of that to me.  Outpatient Encounter Medications as of 10/15/2020  Medication Sig   albuterol (VENTOLIN HFA) 108 (90 Base) MCG/ACT inhaler Inhale 2 puffs into the lungs every 4 (four) hours as needed for shortness of breath or wheezing.   hydrochlorothiazide (HYDRODIURIL) 25 MG tablet Take 1 tablet (25 mg total) by mouth daily.   losartan (COZAAR) 50 MG tablet Take 1 tablet (50 mg total) by mouth daily.   meloxicam (MOBIC) 15 MG tablet Take 15 mg by mouth daily.   rosuvastatin (CRESTOR) 10 MG tablet Take 1 tablet (10 mg total) by mouth daily.   verapamil (VERELAN PM) 180 MG 24 hr capsule Take 1 capsule (180 mg total) by mouth at bedtime.   zolpidem (AMBIEN) 5 MG tablet Take 1 tablet (5 mg total) by mouth at bedtime as needed for sleep.     Observations/Objective: Obese middle-age African-American female in NAD.  Results for orders placed or performed in visit on 06/14/20  CBC  Result Value Ref Range   WBC 5.5 3.4 - 10.8 x10E3/uL   RBC 3.97 3.77 - 5.28 x10E6/uL   Hemoglobin 11.1 11.1 - 15.9 g/dL   Hematocrit 34.6 34.0 - 46.6 %   MCV 87 79 - 97 fL   MCH 28.0 26.6 - 33.0  pg   MCHC 32.1 31.5 - 35.7 g/dL   RDW 13.0 11.7 - 15.4 %   Platelets 270 150 - 450 x10E3/uL  Iron, TIBC and Ferritin Panel  Result Value Ref Range   Total Iron Binding Capacity 257 250 - 450 ug/dL   UIBC 217 131 - 425 ug/dL   Iron 40 27 - 159 ug/dL   Iron Saturation 16 15 - 55 %   Ferritin 112 15 - 150 ng/mL     Assessment and Plan: 1. Essential hypertension Reported levels close to goal.  She will continue her current medications as listed above.  2. Mild intermittent asthma without complication Recent reactivation due to having COVID.  She will continue the albuterol inhaler as needed.  3. Class 3 severe obesity due to  excess calories with serious comorbidity and body mass index (BMI) of 40.0 to 44.9 in adult [facility]) Commended her on trying to stick to healthy eating habits. Continue trying to move as much as she can.  Likely her employment calls for a lot of walking.  I recommend referral to medical weight management and patient is agreeable to the referral.  Order placed.  4. Mixed hyperlipidemia Continue Crestor.  5. History of COVID-19 Up-to-date with vaccines. Advised patient that it is not unusual to have fatigue for several weeks after having recovered from [place].  6. Influenza vaccination declined Recommended.  Patient declined.   Follow Up Instructions: 4 mths   I discussed the assessment and treatment plan with the patient. The patient was provided an opportunity to ask questions and all were answered. The patient agreed with the plan and demonstrated an understanding of the instructions.   The patient was advised to call back or seek an in-person evaluation if the symptoms worsen or if the condition fails to improve as anticipated.  I spent 12 minutes dedicated to the care of this patient on the date of this encounter to include previsit review of of my last note and note from urgent care visit for COVID, face-to-face time with the patient discussing diagnosis and management and post visit placement of orders.   Karle Plumber, MD

## 2020-11-17 ENCOUNTER — Other Ambulatory Visit: Payer: Self-pay | Admitting: Internal Medicine

## 2020-11-17 DIAGNOSIS — I10 Essential (primary) hypertension: Secondary | ICD-10-CM

## 2020-11-17 DIAGNOSIS — E785 Hyperlipidemia, unspecified: Secondary | ICD-10-CM

## 2020-11-17 NOTE — Telephone Encounter (Signed)
Requested Prescriptions  Pending Prescriptions Disp Refills  . verapamil (VERELAN PM) 180 MG 24 hr capsule [Pharmacy Med Name: VERAPAMIL ER 180 MG CAPSULE] 90 capsule 0    Sig: TAKE 1 CAPSULE BY MOUTH AT BEDTIME.--NEEDS OFFICE VISIT     Cardiovascular:  Calcium Channel Blockers Passed - 11/17/2020  8:58 AM      Passed - Last BP in normal range    BP Readings from Last 1 Encounters:  10/03/20 130/80         Passed - Valid encounter within last 6 months    Recent Outpatient Visits          1 month ago Essential hypertension   [place], MD   5 months ago Essential hypertension   [place], Deborah B, MD   1 year ago Essential hypertension   [place], [place], NP   1 year ago Essential hypertension   [place], Stephen L, RPH-CPP   1 year ago Hyperlipidemia, unspecified hyperlipidemia type   [place], Jarome [place]atin, RPH-CPP      Future Appointments            In 1 month [place], FNP [facility] [place]           . rosuvastatin (CRESTOR) 10 MG tablet [Pharmacy Med Name: ROSUVASTATIN CALCIUM 10 MG TAB] 90 tablet 0    Sig: TAKE 1 TABLET BY MOUTH EVERY DAY     Cardiovascular:  Antilipid - Statins Failed - 11/17/2020  8:58 AM      Failed - Total Cholesterol in normal range and within 360 days    Cholesterol, Total  Date Value Ref Range Status  05/16/2019 185 100 - 199 mg/dL Final   Cholesterol  Date Value Ref Range Status  05/25/2020 235 (H) 0 - 200 mg/dL Final         Failed - LDL in normal range and within 360 days    LDL Chol Calc (NIH)  Date Value Ref Range Status  05/16/2019 95 0 - 99 mg/dL Final   LDL Cholesterol  Date Value Ref Range Status  05/25/2020 159 (H) 0 - 99 mg/dL Final    Comment:           Total  Cholesterol/HDL:CHD Risk Coronary Heart Disease Risk Table                     Men   Women  1/2 Average Risk   3.4   3.3  Average Risk       5.0   4.4  2 X Average Risk   9.6   7.1  3 X Average Risk  23.4   11.0        Use the calculated Patient Ratio above and the CHD Risk Table to determine the patient's CHD Risk.        ATP III CLASSIFICATION (LDL):  <100     mg/dL   Optimal  100-129  mg/dL   Near or Above                    Optimal  130-159  mg/dL   Borderline  160-189  mg/dL   High  >190     mg/dL   Very High Performed at [facility]  [address], [place],  [address]          Passed - HDL in normal range and within 360 days    HDL  Date Value Ref Range Status  05/25/2020 67 >40 mg/dL Final  05/16/2019 77 >39 mg/dL Final         Passed - Triglycerides in normal range and within 360 days    Triglycerides  Date Value Ref Range Status  05/25/2020 47 <150 mg/dL Final         Passed - Patient is not pregnant      Passed - Valid encounter within last 12 months    Recent Outpatient Visits          1 month ago Essential hypertension   [place], MD   5 months ago Essential hypertension   [place], Deborah B, MD   1 year ago Essential hypertension   [place], [place], NP   1 year ago Essential hypertension   [place], Stephen L, RPH-CPP   1 year ago Hyperlipidemia, unspecified hyperlipidemia type   [place], RPH-CPP      Future Appointments            In 1 month [place], FNP All[facility] and [place]

## 2021-01-03 NOTE — Progress Notes (Deleted)
   [place]  [place] [address] Dept: [phone]  FOLLOW UP NOTE  Patient ID: Renne Cornick, female    DOB: Jun 22, 1965  Age: 55 y.o. MRN: [id number] Date of Office Visit: 01/04/2021  Assessment  Chief Complaint: No chief complaint on file.  HPI Larri Brewton is a 55 year old female who presents the clinic for follow-up visit.  She was last seen in this clinic on 10/23/2020 by Gareth Morgan, FNP, for an evaluation of shortness of breath, chronic urticaria, and reflux.   Drug Allergies:  Allergies  Allergen Reactions   Lisinopril Cough   Shellfish Allergy    Valsartan Hives   Benadryl Allergy  [Diphenhydramine Hcl (Sleep)] Cough    Physical Exam: There were no vitals taken for this visit.   Physical Exam  Diagnostics:    Assessment and Plan: No diagnosis found.  No orders of the defined types were placed in this encounter.   There are no Patient Instructions on file for this visit.  No follow-ups on file.    Thank you for the opportunity to care for this patient.  Please do not hesitate to contact me with questions.  Gareth Morgan, FNP [facility] [place] [facility] [place]

## 2021-01-03 NOTE — Patient Instructions (Incomplete)
Chronic urticaria Take the least amount of medications while remaining hive free Cetirizine (Zyrtec) 3m twice a day and famotidine (Pepcid) 20 mg twice a day. If no symptoms for 7-14 days then decrease to Cetirizine (Zyrtec) 170mtwice a day and famotidine (Pepcid) 20 mg once a day.  If no symptoms for 7-14 days then decrease to Cetirizine (Zyrtec) 1060mwice a day.  If no symptoms for 7-14 days then decrease to Cetirizine (Zyrtec) 35m55mce a day.  May use Benadryl (diphenhydramine) as needed for breakthrough hives       If symptoms return, then step up dosage Keep a detailed symptom journal including foods eaten, contact with allergens, medications taken, weather changes.    Shortness of breath Begin albuterol 2 puffs once every 4 hours as needed for cough or wheeze You may use albuterol 2 puffs 5 to 15 minutes before activity to decrease cough or wheeze  Call the clinic if this treatment plan is not working well for you.  Follow up in 6 months or sooner if needed.

## 2021-01-04 ENCOUNTER — Ambulatory Visit: Payer: BC Managed Care – PPO | Admitting: Family Medicine

## 2021-01-21 ENCOUNTER — Emergency Department (HOSPITAL_COMMUNITY)
Admission: EM | Admit: 2021-01-21 | Discharge: 2021-01-22 | Disposition: A | Discharge status: Home / Self Care | Payer: BC Managed Care – PPO | Attending: Emergency Medicine | Admitting: Emergency Medicine

## 2021-01-21 ENCOUNTER — Emergency Department (HOSPITAL_COMMUNITY): Payer: BC Managed Care – PPO

## 2021-01-21 ENCOUNTER — Other Ambulatory Visit: Payer: Self-pay

## 2021-01-21 DIAGNOSIS — J45909 Unspecified asthma, uncomplicated: Secondary | ICD-10-CM | POA: Diagnosis not present

## 2021-01-21 DIAGNOSIS — Z20822 Contact with and (suspected) exposure to covid-19: Secondary | ICD-10-CM | POA: Insufficient documentation

## 2021-01-21 DIAGNOSIS — R519 Headache, unspecified: Secondary | ICD-10-CM | POA: Insufficient documentation

## 2021-01-21 DIAGNOSIS — R55 Syncope and collapse: Secondary | ICD-10-CM | POA: Diagnosis present

## 2021-01-21 DIAGNOSIS — R200 Anesthesia of skin: Secondary | ICD-10-CM | POA: Diagnosis not present

## 2021-01-21 DIAGNOSIS — I10 Essential (primary) hypertension: Secondary | ICD-10-CM | POA: Diagnosis not present

## 2021-01-21 DIAGNOSIS — Z79899 Other long term (current) drug therapy: Secondary | ICD-10-CM | POA: Insufficient documentation

## 2021-01-21 LAB — COMPREHENSIVE METABOLIC PANEL
ALT: 12 U/L (ref 0–44)
AST: 19 U/L (ref 15–41)
Albumin: 4.1 g/dL (ref 3.5–5.0)
Alkaline Phosphatase: 95 U/L (ref 38–126)
Anion gap: 9 (ref 5–15)
BUN: 15 mg/dL (ref 6–20)
CO2: 23 mmol/L (ref 22–32)
Calcium: 9.2 mg/dL (ref 8.9–10.3)
Chloride: 106 mmol/L (ref 98–111)
Creatinine, Ser: 0.96 mg/dL (ref 0.44–1.00)
GFR, Estimated: >60 mL/min (ref >60)
Glucose, Bld: 106 mg/dL — ABNORMAL HIGH (ref 70–99)
Potassium: 3.9 mmol/L (ref 3.5–5.1)
Sodium: 138 mmol/L (ref 135–145)
Total Bilirubin: 0.5 mg/dL (ref 0.3–1.2)
Total Protein: 7.3 g/dL (ref 6.5–8.1)

## 2021-01-21 LAB — DIFFERENTIAL
Abs Immature Granulocytes: 0.01 10*3/uL (ref 0.00–0.07)
Basophils Absolute: 0 10*3/uL (ref 0.0–0.1)
Basophils Relative: 0 %
Eosinophils Absolute: 0.2 10*3/uL (ref 0.0–0.5)
Eosinophils Relative: 4 %
Immature Granulocytes: 0 %
Lymphocytes Relative: 34 %
Lymphs Abs: 1.7 10*3/uL (ref 0.7–4.0)
Monocytes Absolute: 0.4 10*3/uL (ref 0.1–1.0)
Monocytes Relative: 7 %
Neutro Abs: 2.7 10*3/uL (ref 1.7–7.7)
Neutrophils Relative %: 55 %

## 2021-01-21 LAB — RESP PANEL BY RT-PCR (FLU A&B, COVID) ARPGX2
Influenza A by PCR: NEGATIVE
Influenza B by PCR: NEGATIVE
SARS Coronavirus 2 by RT PCR: NEGATIVE

## 2021-01-21 LAB — URINALYSIS, ROUTINE W REFLEX MICROSCOPIC
Bilirubin Urine: NEGATIVE
Glucose, UA: NEGATIVE mg/dL
Hgb urine dipstick: NEGATIVE
Ketones, ur: NEGATIVE mg/dL
Nitrite: NEGATIVE
Protein, ur: NEGATIVE mg/dL
Specific Gravity, Urine: >1.03 — ABNORMAL HIGH (ref 1.005–1.030)
pH: 6 (ref 5.0–8.0)

## 2021-01-21 LAB — RAPID URINE DRUG SCREEN, HOSP PERFORMED
Amphetamines: NOT DETECTED
Barbiturates: NOT DETECTED
Benzodiazepines: NOT DETECTED
Cocaine: NOT DETECTED
Opiates: NOT DETECTED
Tetrahydrocannabinol: NOT DETECTED

## 2021-01-21 LAB — I-STAT CHEM 8, ED
BUN: 16 mg/dL (ref 6–20)
Calcium, Ion: 1.13 mmol/L — ABNORMAL LOW (ref 1.15–1.40)
Chloride: 104 mmol/L (ref 98–111)
Creatinine, Ser: 0.9 mg/dL (ref 0.44–1.00)
Glucose, Bld: 105 mg/dL — ABNORMAL HIGH (ref 70–99)
HCT: 36 % (ref 36.0–46.0)
Hemoglobin: 12.2 g/dL (ref 12.0–15.0)
Potassium: 3.9 mmol/L (ref 3.5–5.1)
Sodium: 139 mmol/L (ref 135–145)
TCO2: 26 mmol/L (ref 22–32)

## 2021-01-21 LAB — URINALYSIS, MICROSCOPIC (REFLEX): WBC, UA: >50 WBC/hpf (ref 0–5)

## 2021-01-21 LAB — APTT: aPTT: 30 seconds (ref 24–36)

## 2021-01-21 LAB — CBC
HCT: 37.9 % (ref 36.0–46.0)
Hemoglobin: 11.8 g/dL — ABNORMAL LOW (ref 12.0–15.0)
MCH: 27.5 pg (ref 26.0–34.0)
MCHC: 31.1 g/dL (ref 30.0–36.0)
MCV: 88.3 fL (ref 80.0–100.0)
Platelets: 272 10*3/uL (ref 150–400)
RBC: 4.29 MIL/uL (ref 3.87–5.11)
RDW: 13.3 % (ref 11.5–15.5)
WBC: 5 10*3/uL (ref 4.0–10.5)
nRBC: 0 % (ref 0.0–0.2)

## 2021-01-21 LAB — TROPONIN I (HIGH SENSITIVITY)
Troponin I (High Sensitivity): 6 ng/L (ref <18)
Troponin I (High Sensitivity): 5 ng/L (ref <18)

## 2021-01-21 LAB — PROTIME-INR
INR: 0.9 (ref 0.8–1.2)
Prothrombin Time: 11.9 seconds (ref 11.4–15.2)

## 2021-01-21 LAB — ETHANOL: Alcohol, Ethyl (B): <10 mg/dL (ref <10)

## 2021-01-21 NOTE — ED Provider Notes (Signed)
Emergency Medicine Provider Triage Evaluation Note  Andrea Wilkins , a 55 y.o. female  was evaluated in triage.  Pt complains of syncope episode that occurred last night while taking family photos.  Since then she has been having intermittent numbness left arm, substernal chest pressure, feeling off, and headache.  Review of Systems  Positive:  Negative: See above   Physical Exam  BP (!) 156/81 (BP Location: Left Arm)    Pulse 91    Temp 98.6 F (37 C) (Oral)    Resp (!) 22    Ht 5' 3"  (1.6 m)    Wt 106.6 kg    SpO2 97%    BMI 41.63 kg/m  Gen:   Awake, no distress   Resp:  Normal effort  MSK:   Moves extremities without difficulty  Other:  Decreased subjective sensation along the trigeminal distribution on the left side of the face.  Rest of cranial nerves are intact.  5/5 strength to the upper extremities.  Normal sensation to the upper extremities.  4/5 strength to flexion of the left lower extremity and plantar flexion  Medical Decision Making  Medically screening exam initiated at 7:15 PM.  Appropriate orders placed.  Mialani Reicks was informed that the remainder of the evaluation will be completed by another provider, this initial triage assessment does not replace that evaluation, and the importance of remaining in the ED until their evaluation is complete.     Myna Bright [place], PA-C 01/21/21 [id number]    Noemi Chapel, MD 01/21/21 [phone]

## 2021-01-21 NOTE — ED Triage Notes (Signed)
Pt states she passed out yesterday afternoon. Pt also reports a headache and numbness on her left side.

## 2021-01-22 MED ORDER — METOCLOPRAMIDE HCL 5 MG/ML IJ SOLN
10.0000 mg | INTRAMUSCULAR | Status: AC
  Administered 2021-01-22: 06:00:00 10 mg via INTRAMUSCULAR
  Filled 2021-01-22: qty 2

## 2021-01-22 MED ORDER — KETOROLAC TROMETHAMINE 15 MG/ML IJ SOLN
15.0000 mg | Freq: Once | INTRAMUSCULAR | Status: AC
  Administered 2021-01-22: 06:00:00 15 mg via INTRAMUSCULAR
  Filled 2021-01-22: qty 1

## 2021-01-22 NOTE — ED Provider Notes (Signed)
[facility] EMERGENCY DEPARTMENT Provider Note   CSN: [id number] Arrival date & time: 01/21/21  8366     History Chief Complaint  Patient presents with   Loss of Consciousness   Headache   Numbness    Andrea Wilkins is a 55 y.o. female.  55 year old female with a history of Crohn's disease, hypertension presents to the emergency department for evaluation of syncope.  She was taking family pictures yesterday when she was laughing very hard after someone passed gas. States she was able to get herself together to take the photos and then "I thought I moved with everyone else, but next realized people were calling my name and I was on the ground".  Experienced a syncopal episode.  When she came to on the ground she noted an occipital headache as well as some subjective paresthesias in her left arm, left face, dizziness, intermittent chest pressure.  EMS was called and monitored the patient's vitals.  She reports that she was hypertensive at the time.  Declined transport to the ED, but decided to present today as her headache persisted in addition to the paresthesias in her left arm and left face.  She took 2 Excedrin Migraine earlier in the day without symptomatic improvement.  Presently reports that her numbness has completely resolved.  Has some mild photophobia currently.  She denies fever, double vision, vision loss, tinnitus, hearing loss, extremity weakness, N/V. Not chronically anticoagulated.  The history is provided by the patient. No language interpreter was used.  Loss of Consciousness Associated symptoms: headaches   Headache Associated symptoms: syncope       Past Medical History:  Diagnosis Date   Arthritis 2015   Childhood asthma 10/23/2012   Crohn's disease ([place])    Hypertension Dx 2013   Urticaria     Patient Active Problem List   Diagnosis Date Noted   Mild intermittent asthma without complication 29/47/6546   Chronic urticaria 10/03/2020    Shortness of breath 10/03/2020   Normocytic anemia 06/14/2020   COVID-19 vaccine series completed 06/14/2020   Hypertensive urgency 05/25/2020   Mixed hyperlipidemia 05/25/2020   Chest pain 05/24/2020   Psoriatic arthritis ([place]) 08/03/2019   Extensor tenosynovitis of left wrist 06/13/2016   Ganglion cyst of wrist, left 06/13/2016   Class 3 severe obesity due to excess calories with serious comorbidity and body mass index (BMI) of 40.0 to 44.9 in adult [facility]) 03/14/2016   Left leg pain 06/08/2015   Intertrigo 04/02/2015   Scaly patch rash 04/02/2015   Macular erythematous rash 04/02/2015   Tinea corporis 03/05/2015   Tinea pedis 03/05/2015   Insomnia 03/05/2015   Abnormal CT scan, head 12/15/2014   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     Family History  Problem Relation Age of Onset   Hypertension Mother    Diabetes Sister    Hypertension Sister    Diabetes Father     Social History  Tobacco Use   Smoking status: Never   Smokeless tobacco: Never  Vaping Use   Vaping Use: Never used  Substance Use Topics   Alcohol use: No   Drug use: No    Home Medications Prior to Admission medications   Medication Sig Start Date End Date Taking? Authorizing Provider  albuterol (VENTOLIN HFA) 108 (90 Base) MCG/ACT inhaler Inhale 2 puffs into the lungs every 4 (four) hours as needed for shortness of breath or wheezing. 10/03/20   Dara Hoyer, FNP  hydrochlorothiazide (HYDRODIURIL) 25  MG tablet Take 1 tablet (25 mg total) by mouth daily. 05/25/20   Mikhail, Velta Addison, DO  losartan (COZAAR) 50 MG tablet Take 1 tablet (50 mg total) by mouth daily. 05/25/20   Mikhail, Velta Addison, DO  meloxicam (MOBIC) 15 MG tablet Take 15 mg by mouth daily. 11/02/19   [provider]  rosuvastatin (CRESTOR) 10 MG tablet TAKE 1 TABLET BY MOUTH EVERY DAY 11/17/20   Ladell Pier, MD  verapamil (VERELAN PM) 180 MG 24 hr capsule TAKE 1 CAPSULE BY MOUTH AT BEDTIME.--NEEDS OFFICE VISIT 11/17/20   Ladell Pier, MD  zolpidem (AMBIEN) 5 MG tablet Take 1 tablet (5 mg total) by mouth at bedtime as needed for sleep. 08/23/20   Ladell Pier, MD    Allergies    Lisinopril, Shellfish allergy, Valsartan, and Benadryl allergy  [diphenhydramine hcl (sleep)]  Review of Systems   Review of Systems  Cardiovascular:  Positive for syncope.  Neurological:  Positive for headaches.  Ten systems reviewed and are negative for acute change, except as noted in the HPI.    Physical Exam Updated Vital Signs BP (!) 153/88    Pulse 75    Temp 98.6 F (37 C) (Oral)    Resp 18    Ht 5' 3"  (1.6 m)    Wt 106.6 kg    SpO2 98%    BMI 41.63 kg/m   Physical Exam Vitals and nursing note reviewed.  Constitutional:      General: She is not in acute distress.    Appearance: She is well-developed. She is not diaphoretic.     Comments: Nontoxic appearing and in NAD  HENT:     Head: Normocephalic and atraumatic.  Eyes:     General: No scleral icterus.    Extraocular Movements: Extraocular movements intact.     Conjunctiva/sclera: Conjunctivae normal.  Neck:     Comments: No meningismus Cardiovascular:     Rate and Rhythm: Normal rate and regular rhythm.     Pulses: Normal pulses.  Pulmonary:     Effort: Pulmonary effort is normal. No respiratory distress.     Breath sounds: No stridor.     Comments: Respirations even and unlabored Musculoskeletal:        General: Normal range of motion.     Cervical  back: Normal range of motion.  Skin:    General: Skin is warm and dry.     Coloration: Skin is not pale.     Findings: No erythema or rash.  Neurological:     Mental Status: She is alert and oriented to person, place, and time.     Cranial Nerves: No cranial nerve deficit.     Sensory: No sensory deficit.     Coordination: Coordination normal.     Comments: GCS 15. Speech is goal oriented. No cranial nerve deficits appreciated; symmetric eyebrow raise, no facial drooping, tongue midline. Patient has equal grip strength bilaterally with 5/5 strength  against resistance in all major muscle groups bilaterally. Sensation to light touch intact and equal throughout; patient reports this is now at baseline as compared to triage. Patient moves extremities without ataxia. No pronator drift.   Psychiatric:        Behavior: Behavior normal.    ED Results / Procedures / Treatments   Labs (all labs ordered are listed, but only abnormal results are displayed) Labs Reviewed  CBC - Abnormal; Notable for the following components:      Result Value   Hemoglobin 11.8 (*)    All other components within normal limits  COMPREHENSIVE METABOLIC PANEL - Abnormal; Notable for the following components:   Glucose, Bld 106 (*)    All other components within normal limits  URINALYSIS, ROUTINE W REFLEX MICROSCOPIC - Abnormal; Notable for the following components:   APPearance CLOUDY (*)    Specific Gravity, Urine >1.030 (*)    Leukocytes,Ua SMALL (*)    All other components within normal limits  URINALYSIS, MICROSCOPIC (REFLEX) - Abnormal; Notable for the following components:   Bacteria, UA FEW (*)    All other components within normal limits  I-STAT CHEM 8, ED - Abnormal; Notable for the following components:   Glucose, Bld 105 (*)    Calcium, Ion 1.13 (*)    All other components within normal limits  RESP PANEL BY RT-PCR (FLU A&B, COVID) ARPGX2  ETHANOL  PROTIME-INR  APTT  DIFFERENTIAL  RAPID URINE  DRUG SCREEN, HOSP PERFORMED  TROPONIN I (HIGH SENSITIVITY)  TROPONIN I (HIGH SENSITIVITY)    EKG EKG Interpretation  Date/Time:  Monday January 21 2021 18:43:04 EST Ventricular Rate:  87 PR Interval:  154 QRS Duration: 70 QT Interval:  380 QTC Calculation: 457 R Axis:   17 Text Interpretation: Normal sinus rhythm Normal ECG Confirmed by Noemi Chapel [phone]) on 01/21/2021 7:09:02 PM  Radiology DG Chest 2 View  Result Date: 01/21/2021 CLINICAL DATA:  Chest pain, syncope, headache and numbness EXAM: CHEST - 2 VIEW COMPARISON:  05/24/2020 FINDINGS: The heart size and mediastinal contours are within normal limits. Both lungs are clear. The visualized skeletal structures are unremarkable. IMPRESSION: No active cardiopulmonary disease. Electronically Signed   By: Randa Ngo M.D.   On: 01/21/2021 20:32   CT HEAD WO CONTRAST  Result Date: 01/21/2021 CLINICAL DATA:  Headache, left-sided numbness.  Syncope. EXAM: CT HEAD WITHOUT CONTRAST TECHNIQUE: Contiguous axial images were obtained from the base of the skull through the vertex without intravenous contrast. COMPARISON:  December 02, 2014 FINDINGS: Brain: No evidence of acute infarction, hemorrhage, hydrocephalus, extra-axial collection or mass lesion/mass effect. Similar mild cerebellar tonsillar ectopia. Vascular: No hyperdense vessel. Atherosclerotic calcifications of the internal carotid arteries at skull base. Skull: Normal. Negative for fracture or focal lesion. Sinuses/Orbits: Mucous retention cysts in bilateral maxillary sinuses. Orbits are unremarkable. Other: None IMPRESSION: 1. No acute intracranial findings. 2. Mucous retention cysts in bilateral maxillary sinuses. Electronically Signed   By: Dahlia Bailiff M.D.   On: 01/21/2021 19:52    Procedures Procedures   Medications Ordered in ED Medications  ketorolac (TORADOL) 15 MG/ML injection 15 mg (15 mg Intramuscular Given 01/22/21 0537)  metoCLOPramide (REGLAN) injection 10 mg  (10 mg Intramuscular Given 01/22/21 0537)    ED Course  I have reviewed the triage vital signs and the nursing notes.  Pertinent labs & imaging results that were available during my care of the patient were reviewed by me and considered in my medical decision making (see chart for  details).  Clinical Course as of 01/22/21 0618  Tue Jan 22, 2021  [place] syncope score is 0/negative. Medications given for headache. Will reassess at 0600. [KH]  [phone] Patient reports some improvement in her headache.  States that it was rated at 10/10 on arrival and is presently 07/1018 minutes after receiving medications.  States that she has been followed by [facility] neurology in the past for her migraines.  Will refer back for syncope evaluation, transient paresthesias.  Also plan for outpatient cardiology referral. [KH]  [phone] On chart review, patient did have a reassuring echocardiogram in April 2022. Negative troponin values today. [KH]    Clinical Course User Index [KH] Beverely Pace   MDM Rules/Calculators/A&P                          55 year old female presents to the emergency department for evaluation of a syncopal event which occurred yesterday while at a family gathering.  She reports noting an occipital headache as well as tingling in her left arm and left face once regaining consciousness.  Headache and paresthesias persisted into today prompting her ED evaluation.  On my assessment of the patient, her neurologic exam is nonfocal.  She did have a CT head ordered in triage screening along with labs.  Entirety of her work-up has been normal, reassuring.  No evidence of acute intracranial process such as hemorrhage, hydrocephalus, CVA.  Question whether paresthesias may be related to complex migraines.  She does not have any sensory deficits on my examination and reports that her paresthesias resolved prior to my assessment of her.  She has had some improvement in her headache with a  migraine cocktail.  Plan for discharge and outpatient referral to neurology as well as cardiology for continued evaluation.  Also encouraged recheck by her primary doctor.  Return precautions discussed and provided. Patient discharged in stable condition with no unaddressed concerns.    Final Clinical Impression(s) / ED Diagnoses Final diagnoses:  Syncope, unspecified syncope type    Rx / DC Orders ED Discharge Orders          Ordered    Ambulatory referral to Neurology       Comments: An appointment is requested in approximately: 2-4 weeks   01/22/21 0614             Antonietta Breach, PA-C 26/83/41 9622    Delora Fuel, MD 29/79/89 2238

## 2021-01-22 NOTE — Discharge Instructions (Addendum)
Continue your daily prescribed medications.  Drink plenty of fluids to prevent dehydration.  You have been given referrals to neurology as well as cardiology for further evaluation of your syncopal event.  Your evaluation in the ED today was reassuring. Return for any new or concerning symptoms.

## 2021-02-01 ENCOUNTER — Ambulatory Visit: Payer: BC Managed Care – PPO | Admitting: Family Medicine

## 2021-02-01 NOTE — Patient Instructions (Incomplete)
Chronic urticaria Take the least amount of medications while remaining hive free Cetirizine (Zyrtec) 87m twice a day and famotidine (Pepcid) 20 mg twice a day. If no symptoms for 7-14 days then decrease to Cetirizine (Zyrtec) 173mtwice a day and famotidine (Pepcid) 20 mg once a day.  If no symptoms for 7-14 days then decrease to Cetirizine (Zyrtec) 1057mwice a day.  If no symptoms for 7-14 days then decrease to Cetirizine (Zyrtec) 55m57mce a day.  May use Benadryl (diphenhydramine) as needed for breakthrough hives       If symptoms return, then step up dosage Keep a detailed symptom journal including foods eaten, contact with allergens, medications taken, weather changes.    Shortness of breath Begin albuterol 2 puffs once every 4 hours as needed for cough or wheeze You may use albuterol 2 puffs 5 to 15 minutes before activity to decrease cough or wheeze  Call the clinic if this treatment plan is not working well for you.  Follow up in 6 months or sooner if needed.

## 2021-02-01 NOTE — Progress Notes (Deleted)
° °  [place] [address] Dept: [phone]  FOLLOW UP NOTE  Patient ID: Andrea Wilkins, female    DOB: 08-23-1965  Age: 56 y.o. MRN: [id number] Date of Office Visit: 02/01/2021  Assessment  Chief Complaint: No chief complaint on file.  HPI Andrea Wilkins is a 56 year old female who presents the clinic for follow-up visit.  She was last seen in this clinic on 10/03/2020 by Gareth Morgan, FNP, for evaluation of urticaria and shortness of breath.  In the interim, she visited the emergency department on 01/21/2021 for evaluation of syncope.   Drug Allergies:  Allergies  Allergen Reactions   Lisinopril Cough   Shellfish Allergy    Valsartan Hives   Benadryl Allergy  [Diphenhydramine Hcl (Sleep)] Cough    Physical Exam: There were no vitals taken for this visit.   Physical Exam  Diagnostics:    Assessment and Plan: No diagnosis found.  No orders of the defined types were placed in this encounter.   There are no Patient Instructions on file for this visit.  No follow-ups on file.    Thank you for the opportunity to care for this patient.  Please do not hesitate to contact me with questions.  Gareth Morgan, FNP [facility] [place] [facility] [place]

## 2021-02-08 ENCOUNTER — Encounter: Payer: Self-pay | Admitting: Cardiology

## 2021-02-08 ENCOUNTER — Other Ambulatory Visit: Payer: Self-pay

## 2021-02-08 ENCOUNTER — Ambulatory Visit: Payer: BC Managed Care – PPO | Admitting: Cardiology

## 2021-02-08 VITALS — BP 141/82 | HR 73 | Temp 98.1°F | Resp 17 | Ht 63.0 in | Wt 241.4 lb

## 2021-02-08 DIAGNOSIS — R0609 Other forms of dyspnea: Secondary | ICD-10-CM

## 2021-02-08 DIAGNOSIS — R55 Syncope and collapse: Secondary | ICD-10-CM | POA: Insufficient documentation

## 2021-02-08 DIAGNOSIS — I10 Essential (primary) hypertension: Secondary | ICD-10-CM

## 2021-02-08 DIAGNOSIS — R079 Chest pain, unspecified: Secondary | ICD-10-CM

## 2021-02-08 DIAGNOSIS — E782 Mixed hyperlipidemia: Secondary | ICD-10-CM

## 2021-02-08 NOTE — Progress Notes (Signed)
Patient referred by Ladell Pier, MD for syncope  Subjective:   Andrea Wilkins, female    DOB: 04/13/1965, 56 y.o.   MRN: [id number]   Chief Complaint  Patient presents with   New Patient (Initial Visit)   Hospitalization Follow-up   Loss of Consciousness     HPI  56 year old African-American female with hypertension, Crohn's disease, referred for evaluation of syncope  Patient was seen in emergency room on 01/21/2021 after an episode of syncope.  History of her syncopal episode very well described in the ER visit as below.  55 year old female with a history of Crohn's disease, hypertension presents to the emergency department for evaluation of syncope.  She was taking family pictures yesterday when she was laughing very hard after someone passed gas. States she was able to get herself together to take the photos and then "I thought I moved with everyone else, but next realized people were calling my name and I was on the ground".  Experienced a syncopal episode.  When she came to on the ground she noted an occipital headache as well as some subjective paresthesias in her left arm, left face, dizziness, intermittent chest pressure.  EMS was called and monitored the patient's vitals.  She reports that she was hypertensive at the time.  Declined transport to the ED, but decided to present today as her headache persisted in addition to the paresthesias in her left arm and left face.  She took 2 Excedrin Migraine earlier in the day without symptomatic improvement.  Presently reports that her numbness has completely resolved.  Has some mild photophobia currently.  She denies fever, double vision, vision loss, tinnitus, hearing loss, extremity weakness, N/V. Not chronically anticoagulated.  Patient corroborated the above history. Work-up in the ER including CT head, routine blood work were unremarkable.  Processes were felt to be related to complex migraines.  Headache improved with  migraine cocktail.  She was recommended a cardiology and neurology follow-up.   Patient had had a syncope when she was 20. 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    Past Medical History:  Diagnosis Date   Arthritis 2015   Childhood asthma 10/23/2012   Crohn's disease ([place])    Hypertension Dx 2013   Urticaria      Past Surgical History:  Procedure Laterality Date   ABDOMINAL HYSTERECTOMY  2008   BREAST SURGERY  2007   [place]   SMALL INTESTINE SURGERY  2008     Social History   Tobacco Use  Smoking Status Never  Smokeless Tobacco Never    Social History   Substance and Sexual Activity  Alcohol Use No     Family History  Problem Relation Age of Onset   Hypertension Mother    Diabetes Sister    Hypertension Sister    Diabetes Father      Current Outpatient Medications on File Prior to Visit  Medication Sig Dispense Refill   albuterol (VENTOLIN HFA) 108 (90 Base) MCG/ACT inhaler Inhale 2 puffs into the lungs every 4 (four) hours as needed for shortness of breath or wheezing. 1 each 1   hydrochlorothiazide (HYDRODIURIL) 25 MG tablet Take 1 tablet (25 mg total) by mouth daily. 30 tablet 2   losartan (COZAAR) 50 MG tablet Take 1 tablet (50 mg total) by mouth daily. 30 tablet 2   meloxicam (MOBIC) 15 MG tablet  15 mg by mouth daily.    ° rosuvastatin (CRESTOR) 10 MG tablet TAKE 1 TABLET BY MOUTH EVERY DAY 90 tablet 0  ° verapamil (VERELAN PM) 180 MG 24 hr capsule TAKE 1 CAPSULE BY MOUTH AT BEDTIME.--NEEDS OFFICE VISIT 90 capsule 0  ° zolpidem (AMBIEN) 5 MG tablet Take 1 tablet (5 mg total) by mouth at bedtime as needed for sleep. 15 tablet 0  ° °No current facility-administered medications on file prior to visit.  ° ° °Cardiovascular and other pertinent studies: ° °EKG 02/08/2021: °Sinus rhythm 75 bpm °Normal EKG ° ° °Recent labs: °01/21/2021: °Glucose 105,  BUN/Cr 16/0.9. EGFR >60. Na/K 139/3.9. Rest of the CMP normal °H/H 11/37. MCV 88. Platelets 272 °Chol 235, TG 47, HDL 67, LDL 159 ° ° °Review of Systems  °Cardiovascular:  Positive for chest pain, dyspnea on exertion and syncope. Negative for leg swelling and palpitations.  ° °   ° ° °Vitals:  ° 02/08/21 0847  °BP: (!) 141/82  °Pulse: 73  °Resp: 17  °Temp: 98.1 °F (36.7 °C)  °SpO2: 97%  ° °Orthostatic VS for the past 72 hrs (Last 3 readings): ° Orthostatic BP Patient Position BP Location Cuff Size Orthostatic Pulse  °02/08/21 0853 (!) 145/103 Standing Left Wrist Normal 85  °02/08/21 0852 (!) 147/95 Sitting Left Wrist Normal 82  °02/08/21 0851 141/79 Supine Left Wrist Normal 76  ° ° ° °Body mass index is 42.76 kg/m². °Filed Weights  ° 02/08/21 0847  °Weight: 241 lb 6.4 oz (109.5 kg)  ° ° ° °Objective:  ° Physical Exam °Vitals and nursing note reviewed.  °Constitutional:   °   General: She is not in acute distress. °Neck:  °   Vascular: No JVD.  °Cardiovascular:  °   Rate and Rhythm: Normal rate and regular rhythm.  °   Heart sounds: Normal heart sounds. No murmur heard. °Pulmonary:  °   Effort: Pulmonary effort is normal.  °   Breath sounds: Normal breath sounds. No wheezing or rales.  °Musculoskeletal:  °   Right lower leg: No edema.  °   Left lower leg: No edema.  ° ° °   ° °Assessment & Recommendations:  ° °55-year-old African-American female with hypertension, Crohn's disease, referred for evaluation of syncope ° °Syncope: °Most likely vasovagal syncope (after laughing). °Will check echocardiogram. °She is currently wearing an Apple watch. Will monitor on Cardiologs. ° °Exertional chest pain, dyspnea: °Risk factors hypertension and mixed hyperlipidemia. °Recommend exercise nuclear stress test, echocardiogram, calcium score scan. ° °Hypertension: °Will recheck at next visit. No change today. ° °Further recommendations after above testing ° °Thank you for referring the patient to us. Please feel free to contact  with any questions. ° ° ° J , MD °Pager: [phone] °Office: [phone] °

## 2021-02-12 ENCOUNTER — Other Ambulatory Visit: Payer: Self-pay | Admitting: Internal Medicine

## 2021-02-12 DIAGNOSIS — I10 Essential (primary) hypertension: Secondary | ICD-10-CM

## 2021-02-12 NOTE — Telephone Encounter (Signed)
Will forward to provider  

## 2021-02-12 NOTE — Telephone Encounter (Signed)
Pt had appt 10/15/21, was to F/U in 4 months. No appt available until 04/15/21 to fit pt's schedule. Courtesy refill provided. Appt secured for 04/15/21   Requested Prescriptions  Pending Prescriptions Disp Refills   verapamil (VERELAN PM) 180 MG 24 hr capsule [Pharmacy Med Name: VERAPAMIL SR 180 MG CAPSULE] 90 capsule 0    Sig: TAKE 1 CAPSULE BY MOUTH EVERY DAY AT BEDTIME NEED OFFICE VISIT     Cardiovascular:  Calcium Channel Blockers Failed - 02/12/2021 11:31 AM      Failed - Last BP in normal range    BP Readings from Last 1 Encounters:  02/08/21 (!) 141/82         Passed - Valid encounter within last 6 months    Recent Outpatient Visits          4 months ago Essential hypertension   [place], Deborah B, MD   8 months ago Essential hypertension   [place], Deborah B, MD   1 year ago Essential hypertension   [place], [place], NP   1 year ago Essential hypertension   [place], Stephen L, RPH-CPP   1 year ago Hyperlipidemia, unspecified hyperlipidemia type   [place], Jarome Matin, RPH-CPP      Future Appointments            In 1 month Pat[place], Reynold Bowen, MD [facility]   In 2 months Ladell Pier, MD [place]

## 2021-02-16 ENCOUNTER — Other Ambulatory Visit: Payer: Self-pay | Admitting: Internal Medicine

## 2021-02-16 DIAGNOSIS — E785 Hyperlipidemia, unspecified: Secondary | ICD-10-CM

## 2021-02-16 NOTE — Telephone Encounter (Signed)
Requested Prescriptions  Pending Prescriptions Disp Refills   rosuvastatin (CRESTOR) 10 MG tablet [Pharmacy Med Name: ROSUVASTATIN CALCIUM 10 MG TAB] 90 tablet 0    Sig: TAKE 1 TABLET BY MOUTH EVERY DAY     Cardiovascular:  Antilipid - Statins Failed - 02/16/2021 10:01 AM      Failed - Total Cholesterol in normal range and within 360 days    Cholesterol, Total  Date Value Ref Range Status  05/16/2019 185 100 - 199 mg/dL Final   Cholesterol  Date Value Ref Range Status  05/25/2020 235 (H) 0 - 200 mg/dL Final         Failed - LDL in normal range and within 360 days    LDL Chol Calc (NIH)  Date Value Ref Range Status  05/16/2019 95 0 - 99 mg/dL Final   LDL Cholesterol  Date Value Ref Range Status  05/25/2020 159 (H) 0 - 99 mg/dL Final    Comment:           Total Cholesterol/HDL:CHD Risk Coronary Heart Disease Risk Table                     Men   Women  1/2 Average Risk   3.4   3.3  Average Risk       5.0   4.4  2 X Average Risk   9.6   7.1  3 X Average Risk  23.4   11.0        Use the calculated Patient Ratio above and the CHD Risk Table to determine the patient's CHD Risk.        ATP III CLASSIFICATION (LDL):  <100     mg/dL   Optimal  100-129  mg/dL   Near or Above                    Optimal  130-159  mg/dL   Borderline  160-189  mg/dL   High  >190     mg/dL   Very High Performed at [facility] [address], [place], [place] [address]          Passed - HDL in normal range and within 360 days    HDL  Date Value Ref Range Status  05/25/2020 67 >40 mg/dL Final  05/16/2019 77 >39 mg/dL Final         Passed - Triglycerides in normal range and within 360 days    Triglycerides  Date Value Ref Range Status  05/25/2020 47 <150 mg/dL Final         Passed - Patient is not pregnant      Passed - Valid encounter within last 12 months    Recent Outpatient Visits          4 months ago Essential hypertension   [place], Deborah B, MD   8 months ago Essential hypertension   [place], Deborah B, MD   1 year ago Essential hypertension   [place], [place], NP   1 year ago Essential hypertension   [place], Stephen L, RPH-CPP   1 year ago Hyperlipidemia, unspecified hyperlipidemia type   [place], RPH-CPP      Future Appointments            In 1 month Patwardhan,  Reynold Bowen, MD [facility]   In 1 month Ladell Pier, MD [place]

## 2021-03-06 ENCOUNTER — Other Ambulatory Visit: Payer: Self-pay

## 2021-03-06 ENCOUNTER — Ambulatory Visit: Payer: BC Managed Care – PPO

## 2021-03-06 DIAGNOSIS — R079 Chest pain, unspecified: Secondary | ICD-10-CM

## 2021-03-06 DIAGNOSIS — R55 Syncope and collapse: Secondary | ICD-10-CM

## 2021-03-06 DIAGNOSIS — R0609 Other forms of dyspnea: Secondary | ICD-10-CM

## 2021-03-07 ENCOUNTER — Ambulatory Visit
Admission: RE | Admit: 2021-03-07 | Discharge: 2021-03-07 | Disposition: A | Discharge status: Home / Self Care | Payer: No Typology Code available for payment source | Source: Ambulatory Visit | Attending: Cardiology | Admitting: Cardiology

## 2021-03-07 DIAGNOSIS — R079 Chest pain, unspecified: Secondary | ICD-10-CM

## 2021-03-18 ENCOUNTER — Other Ambulatory Visit: Payer: Self-pay

## 2021-03-18 ENCOUNTER — Encounter: Payer: Self-pay | Admitting: Cardiology

## 2021-03-18 ENCOUNTER — Ambulatory Visit: Payer: BC Managed Care – PPO | Admitting: Cardiology

## 2021-03-18 VITALS — BP 153/91 | HR 80 | Temp 98.1°F | Resp 16 | Ht 63.0 in | Wt 243.4 lb

## 2021-03-18 DIAGNOSIS — R55 Syncope and collapse: Secondary | ICD-10-CM

## 2021-03-18 DIAGNOSIS — I10 Essential (primary) hypertension: Secondary | ICD-10-CM

## 2021-03-18 DIAGNOSIS — E782 Mixed hyperlipidemia: Secondary | ICD-10-CM

## 2021-03-18 DIAGNOSIS — R931 Abnormal findings on diagnostic imaging of heart and coronary circulation: Secondary | ICD-10-CM

## 2021-03-18 MED ORDER — ROSUVASTATIN CALCIUM 40 MG PO TABS: 40.0000 mg | ORAL_TABLET | Freq: Every day | ORAL | 3 refills | Status: DC

## 2021-03-18 NOTE — Progress Notes (Addendum)
Patient referred by Ladell Pier, MD for syncope  Subjective:   Golden Hurter, female    DOB: [birth year], 56 y.o.   MRN: [id number]   Chief Complaint  Patient presents with   Results   Follow-up     HPI  56 year old African-American female with hypertension, Crohn's disease, referred for evaluation of syncope  Patient is doing well, has not had any syncope episode since last visit. Reviewed rewcent test results with the patient, details below.   Initial consultation visit 01/2021: Patient was seen in emergency room on 01/21/2021 after an episode of syncope.  History of her syncopal episode very well described in the ER visit as below.  56 year old female with a history of Crohn's disease, hypertension presents to the emergency department for evaluation of syncope.  She was taking family pictures yesterday when she was laughing very hard after someone passed gas. States she was able to get herself together to take the photos and then "I thought I moved with everyone else, but next realized people were calling my name and I was on the ground".  Experienced a syncopal episode.  When she came to on the ground she noted an occipital headache as well as some subjective paresthesias in her left arm, left face, dizziness, intermittent chest pressure.  EMS was called and monitored the patient's vitals.  She reports that she was hypertensive at the time.  Declined transport to the ED, but decided to present today as her headache persisted in addition to the paresthesias in her left arm and left face.  She took 2 Excedrin Migraine earlier in the day without symptomatic improvement.  Presently reports that her numbness has completely resolved.  Has some mild photophobia currently.  She denies fever, double vision, vision loss, tinnitus, hearing loss, extremity weakness, N/V. Not chronically anticoagulated.  Patient corroborated the above history. Work-up in the ER including CT head,  routine blood work were unremarkable.  Processes were felt to be related to complex migraines.  Headache improved with migraine cocktail.  She was recommended a cardiology and neurology follow-up.   Patient had had a syncope when she was 20. 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   Current Outpatient Medications on File Prior to Visit  Medication Sig Dispense Refill   albuterol (VENTOLIN HFA) 108 (90 Base) MCG/ACT inhaler Inhale 2 puffs into the lungs every 4 (four) hours as needed for shortness of breath or wheezing. 1 each 1   hydrochlorothiazide (HYDRODIURIL) 25 MG tablet Take 1 tablet (25 mg total) by mouth daily. 30 tablet 2   meloxicam (MOBIC) 15 MG tablet Take 15 mg by mouth daily.     rosuvastatin (CRESTOR) 10 MG tablet TAKE 1 TABLET BY MOUTH EVERY DAY 90 tablet 0   verapamil (VERELAN PM) 180 MG 24 hr capsule TAKE 1 CAPSULE BY MOUTH EVERY DAY AT BEDTIME NEED OFFICE VISIT 90 capsule 0   zolpidem (AMBIEN) 5 MG tablet Take 1 tablet (5 mg total) by mouth at bedtime as needed for sleep. 15 tablet 0   No current facility-administered medications on file prior to visit.    Cardiovascular and other pertinent studies:  CT Cardiac scoring 03/07/2021: 1. Total Agatston score of 17.4 (LAD + RCA), corresponding to 83rd percentile for age, sex, and race based cohort. 2. Pulmonary artery enlargement suggests pulmonary arterial hypertension. 3. Aortic Atherosclerosis   Exercise Sestamibi stress test 03/06/2021:  nuclear stress test was performed using Bruce protocol. Patient reached 6.7 METS, and 87% of age predicted maximum heart rate. Exercise capacity was low. No chest pain reported. Dyspnea reported. Heart rate and hemodynamic response were normal. Stress EKG revealed no ischemic changes. °Normal myocardial perfusion. Stress LVEF 58%. °Low risk study. ° °Echocardiogram 03/06/2021:  °Left  ventricle cavity is normal in size. Mild concentric hypertrophy of  °the left ventricle. Normal global wall motion. Normal LV systolic function  °with EF 67%. Doppler evidence of grade I (impaired) diastolic dysfunction,  °normal LAP.  °No significant valvular abnormality.  °Normal right atrial pressure.  ° °EKG 02/08/2021: °Sinus rhythm 75 bpm °Normal EKG ° °Recent labs: °01/21/2021: °Glucose 105, BUN/Cr 16/0.9. EGFR >60. Na/K 139/3.9. Rest of the CMP normal °H/H 11/37. MCV 88. Platelets 272 °Chol 235, TG 47, HDL 67, LDL 159 ° ° °Review of Systems  °Cardiovascular:  Positive for chest pain, dyspnea on exertion and syncope. Negative for leg swelling and palpitations.  ° °   ° ° °Vitals:  ° 03/18/21 1309  °BP: (!) 153/91  °Pulse: 80  °Resp: 16  °Temp: 98.1 °F (36.7 °C)  °SpO2: 96%  ° ° ° °Body mass index is 43.12 kg/m². °Filed Weights  ° 03/18/21 1309  °Weight: 243 lb 6.4 oz (110.4 kg)  ° ° ° °Objective:  ° Physical Exam °Vitals and nursing note reviewed.  °Constitutional:   °   General: She is not in acute distress. °Neck:  °   Vascular: No JVD.  °Cardiovascular:  °   Rate and Rhythm: Normal rate and regular rhythm.  °   Heart sounds: Normal heart sounds. No murmur heard. °Pulmonary:  °   Effort: Pulmonary effort is normal.  °   Breath sounds: Normal breath sounds. No wheezing or rales.  °Musculoskeletal:  °   Right lower leg: No edema.  °   Left lower leg: No edema.  ° ° °   ° °Assessment & Recommendations:  ° °55-year-old African-American female with hypertension, Crohn's disease, referred for evaluation of syncope ° °Syncope: °Most likely vasovagal syncope (after laughing). °Structurally normal heart. °Mild PA dilatation on CT chest without echocardiographic evidence of PH °Calcium score 17 (LAD + RCA), 83rd percentile °Stress testing with no ischemia. °Cardiologs monitoring did not show any arrhythmia events captured on her Apple watch. ° °Elevated calcium score, mixed hyperlipidemia: °17, 83rd percentile. °LDL  159. °Increase rosuvastatin from 10 mg to 40 mg. ° °Hypertension: °Blood pressure elevated today. Usually better at home. °No change made today. She has f/u w/PCP next month. °Consider adding ACEi/ARB/atenolol.  ° °Lipid panel and f/u in 6 momths, prn f/u after that ° ° ° ° J , MD °Pager: [phone] °Office: [phone] °

## 2021-03-25 NOTE — Progress Notes (Signed)
[facility]    Provider:  Dr Jaynee Eagles Requesting Provider: Antonietta Breach, PA-C Primary Care Provider:  Ladell Pier, MD  CC:  loss of consciousness  HPI:  Andrea Wilkins is a 56 y.o. female here as requested by Antonietta Breach, PA-C for syncope,headache and numbness. PMHx Crohn's disease, HLD, HTN, arthritis, obesity, arthritis(psoriatic, rheumatologist Dr. Luan Pulling), chiari malformation type 1, insomnia, hx of asthma.  I reviewed epic chart.  Patient was seen in the emergency room January 21, 2021 history of syncope which occurred after she was laughing very hard during family pictures, she passed out and then came on the ground with headache, dizziness and left arm numbness, since then she had felt like her left leg was dragging and some intermittent chest pain, she had numbness to the left face and arm on exam, normal strength.  Patient lost consciousness, she found herself on the ground and everyone was calling her name, headache was occipital, she was hypertensive at the time per EMS, she declined transport to the ED but decided to present to the emergency room on 1226 when her headache persisted in addition to the paresthesias in her left arm and face.  The numbness has completely resolved, some mild photophobia currently.  When the physician saw her, her neurologic exam was nonfocal, CT head along with labs was unremarkable, they questioned "complex migraines".  She had some improvement in her headache with a migraine cocktail. (20 minutes reviewing epic chart 03/25/2021)  It was Christmas day, they were taking a picture, morning she had not eaten, she ate breakfast and a biscuit and grits and sausage, about 2-3pm, she passed gas and she laughed hard and her head felt dizzy and lightheaded, and she felt like the whole room was spinning, it passed quickly, more lightheadedness, she leaned over because she was standing and felt better. She was standing, lost consciousness  without any symptoms, she fell on the christmas tree, they were standing over her calling her name and asking if OK, no confusion, no seizure-like movements, when she got up and were alert she got into a chair and started shaking because she was cold totally aware by, no urination, no defecation, no recent illnesses, took her 20 minutes then she got a very back headache a sharp pain in the back of the head and left arm numbness. Her SBP was > 200 (she had forgotten her BP pills) but she was anxious, EMT took her BP and it was down to 190/110, she was really anxious and her BP doesn't run that high, EKG was ok per patient. She declined transport to ED, she took excedrin migraines, prior this she passed out years earlier in the heat, similar and was not drinking fluids. No cataplexy, hypnogogic or other symptoms of narcolepsy. No hx of seizures, narcolepsy, aunt with Multiple sclerosis, she is completely resolved since episode and no more episodes. No other focal neurologic deficits, associated symptoms, inciting events or modifiable factors.  Reviewed notes, labs and imaging from outside physicians, which showed:  CT head: IMPRESSION: 1. No acute intracranial findings. 2. Mucous retention cysts in bilateral maxillary sinuses.  Personally reviewed with images and agree with findings  Cbc/cmp 01/21/2021 : unremrakable  Review of Systems: Patient complains of symptoms per HPI as well as the following symptoms had CTS surgery left arm . Pertinent negatives and positives per HPI. All others negative.   Social History   Socioeconomic History   Marital status: Married    Spouse name: Not on  file   Number of children: 2   Years of education: Not on file   Highest education level: Not on file  Occupational History   Not on file  Tobacco Use   Smoking status: Never   Smokeless tobacco: Never  Vaping Use   Vaping Use: Never used  Substance and Sexual Activity   Alcohol use: No   Drug use: No    Sexual activity: Yes    Birth control/protection: Surgical  Other Topics Concern   Not on file  Social History Narrative   Not on file   Social Determinants of Health   Financial Resource Strain: Not on file  Food Insecurity: Not on file  Transportation Needs: Not on file  Physical Activity: Not on file  Stress: Not on file  Social Connections: Not on file  Intimate Partner Violence: Not on file    Family History  Problem Relation Age of Onset   Migraines Mother    Hypertension Mother    Diabetes Father    Migraines Sister    Diabetes Sister    Hypertension Sister     Past Medical History:  Diagnosis Date   Arthritis 2015   Childhood asthma 10/23/2012   Crohn's disease ([place])    Hyperlipidemia    Hypertension Dx 2013   Urticaria     Patient Active Problem List   Diagnosis Date Noted   Elevated coronary artery calcium score 03/18/2021   Vasovagal syncope 02/08/2021   Mild intermittent asthma without complication 02/72/5366   Chronic urticaria 10/03/2020   Exertional dyspnea 10/03/2020   Normocytic anemia 06/14/2020   COVID-19 vaccine series completed 06/14/2020   Hypertensive urgency 05/25/2020   Mixed hyperlipidemia 05/25/2020   Exertional chest pain 05/24/2020   Psoriatic arthritis ([place]) 08/03/2019   Extensor tenosynovitis of left wrist 06/13/2016   Ganglion cyst of wrist, left 06/13/2016   Class 3 severe obesity due to excess calories with serious comorbidity and body mass index (BMI) of 40.0 to 44.9 in adult [facility]) 03/14/2016   Left leg pain 06/08/2015   Intertrigo 04/02/2015   Scaly patch rash 04/02/2015   Macular erythematous rash 04/02/2015   Tinea corporis 03/05/2015   Tinea pedis 03/05/2015   Insomnia 03/05/2015   Abnormal CT scan, head 12/15/2014   Chiari malformation type I ([place]) 12/15/2014   Occipital headache 12/15/2014   Radicular pain in left arm 12/15/2014   IDA (iron deficiency anemia) 10/27/2013   Rheumatoid arthritis ([place]) 10/27/2013    Crohn's disease ([place]) 10/23/2012   Childhood asthma 10/23/2012   Tension headache 06/14/2012   Essential hypertension 06/14/2012    Past Surgical History:  Procedure Laterality Date   ABDOMINAL HYSTERECTOMY  01/27/2006   BREAST SURGERY  01/27/2005   CARPAL TUNNEL RELEASE     CESAREAN SECTION  01/27/1986   SMALL INTESTINE SURGERY  01/27/2006    Current Outpatient Medications  Medication Sig Dispense Refill   albuterol (VENTOLIN HFA) 108 (90 Base) MCG/ACT inhaler Inhale 2 puffs into the lungs every 4 (four) hours as needed for shortness of breath or wheezing. 1 each 1   hydrochlorothiazide (HYDRODIURIL) 25 MG tablet Take 1 tablet (25 mg total) by mouth daily. 30 tablet 2   meloxicam (MOBIC) 15 MG tablet Take 15 mg by mouth daily.     rosuvastatin (CRESTOR) 40 MG tablet Take 1 tablet (40 mg total) by mouth daily. 90 tablet 3   verapamil (VERELAN PM) 180 MG 24 hr capsule TAKE 1 CAPSULE BY MOUTH EVERY DAY AT BEDTIME  NEED OFFICE VISIT 90 capsule 0   zolpidem (AMBIEN) 5 MG tablet Take 1 tablet (5 mg total) by mouth at bedtime as needed for sleep. 15 tablet 0   No current facility-administered medications for this visit.    Allergies as of 03/26/2021 - Review Complete 03/26/2021  Allergen Reaction Noted   Lisinopril Cough 05/16/2019   Shellfish allergy  07/25/2016   Valsartan Hives 05/16/2019   Benadryl allergy  [diphenhydramine hcl (sleep)] Cough 11/01/2015    Vitals: BP (!) 143/92    Pulse 92    Ht 5' 3"  (1.6 m)    Wt 244 lb 3.2 oz (110.8 kg)    BMI 43.26 kg/m  Last Weight:  Wt Readings from Last 1 Encounters:  03/26/21 244 lb 3.2 oz (110.8 kg)   Last Height:   Ht Readings from Last 1 Encounters:  03/26/21 5' 3"  (1.6 m)     Physical exam: Exam: Gen: NAD, conversant, well nourised, obese, well groomed                     CV: RRR, no MRG. No Carotid Bruits. No peripheral edema, warm, nontender Eyes: Conjunctivae clear without exudates or hemorrhage  Neuro: Detailed  Neurologic Exam  Speech:    Speech is normal; fluent and spontaneous with normal comprehension.  Cognition:    The patient is oriented to person, place, and time;     recent and remote memory intact;     language fluent;     normal attention, concentration,     fund of knowledge Cranial Nerves:    The pupils are equal, round, and reactive to light. The fundi are flat. Visual fields are full to finger confrontation. Extraocular movements are intact. Trigeminal sensation is intact and the muscles of mastication are normal. The face is symmetric. The palate elevates in the midline. Hearing intact. Voice is normal. Shoulder shrug is normal. The tongue has normal motion without fasciculations.   Coordination:    Normal   Gait:    normal.   Motor Observation:    No asymmetry, no atrophy, and no involuntary movements noted. Tone:    Normal muscle tone.    Posture:    Posture is normal. normal erect    Strength:    Strength is V/V in the upper and lower limbs.      Sensation: intact to LT     Reflex Exam:  DTR's:    Deep tendon reflexes in the upper and lower extremities are symmetrical bilaterally.   Toes:    The toes are downgoing bilaterally.   Clonus:    Clonus is absent.    Assessment/Plan:   56 y.o. female here as requested by Antonietta Breach, PA-C for syncope,headache and numbness. PMHx Crohn's disease, HLD, HTN, arthritis, obesity, arthritis(psoriatic, rheumatologist Dr. Luan Pulling), chiari malformation type 1, insomnia, hx of asthma.  I reviewed epic chart.  Patient was seen in the emergency room January 21, 2021 history of syncope which occurred after she was laughing very hard during family pictures, she passed out and then came on the ground with headache, dizziness and left arm numbness  ED questioned "complex migraine" f/u with Dr. Domingo Cocking We will evaluate for stroke or seizure but sounds like vasovagal syncope : MRI with Seizure and stroke protocol  Orders Placed This  Encounter  Procedures   MR BRAIN W WO CONTRAST   EEG adult    Cc: Beverely Pace,  Ladell Pier, MD  Sarina Ill, MD  [facility] [address] [place], [place] [address]  Phone [phone] Fax [phone]

## 2021-03-26 ENCOUNTER — Telehealth: Payer: Self-pay | Admitting: Neurology

## 2021-03-26 ENCOUNTER — Other Ambulatory Visit: Payer: Self-pay

## 2021-03-26 ENCOUNTER — Ambulatory Visit: Payer: BC Managed Care – PPO | Admitting: Neurology

## 2021-03-26 ENCOUNTER — Encounter: Payer: Self-pay | Admitting: Neurology

## 2021-03-26 VITALS — BP 143/92 | HR 92 | Ht 63.0 in | Wt 244.2 lb

## 2021-03-26 DIAGNOSIS — R55 Syncope and collapse: Secondary | ICD-10-CM

## 2021-03-26 DIAGNOSIS — R519 Headache, unspecified: Secondary | ICD-10-CM

## 2021-03-26 DIAGNOSIS — R29898 Other symptoms and signs involving the musculoskeletal system: Secondary | ICD-10-CM

## 2021-03-26 DIAGNOSIS — R419 Unspecified symptoms and signs involving cognitive functions and awareness: Secondary | ICD-10-CM | POA: Diagnosis not present

## 2021-03-26 NOTE — Patient Instructions (Addendum)
MRI brain EEG  Vasovagal syncope Overview Vasovagal syncope (vay-zoh-VAY-gul SING-kuh-pee) occurs when you faint because your body overreacts to certain triggers, such as the sight of blood or extreme emotional distress. It may also be called neurocardiogenic syncope.  The vasovagal syncope trigger causes your heart rate and blood pressure to drop suddenly. That leads to reduced blood flow to your brain, causing you to briefly lose consciousness.  Vasovagal syncope is usually harmless and requires no treatment. But it's possible that you may injure yourself during a vasovagal syncope episode. Your doctor may recommend tests to rule out more-serious causes of fainting, such as heart disorders.  Symptoms Before you faint due to vasovagal syncope, you may experience some of the following:  Pale skin Lightheadedness Tunnel vision -- your field of vision narrows so that you see only what's in front of you Nausea Feeling warm A cold, clammy sweat Blurred vision During a vasovagal syncope episode, bystanders may notice:  Jerky, abnormal movements A slow, weak pulse Dilated pupils Recovery after a vasovagal episode generally begins in less than a minute. However, if you stand up too soon after fainting -- within about 15 to 30 minutes -- you're at risk of fainting again.  When to see a doctor Fainting can be a sign of a more serious condition, such as a heart or brain disorder. You may want to consult your doctor after a fainting spell, especially if you've never had one before.  Causes Vasovagal syncope occurs when the part of your nervous system that regulates heart rate and blood pressure malfunctions in response to a trigger, such as the sight of blood.  Your heart rate slows, and the blood vessels in your legs widen (dilate). This allows blood to pool in your legs, which lowers your blood pressure. Combined, the drop in blood pressure and slowed heart rate quickly reduce blood flow to  your brain, and you faint.  Sometimes there is no classical vasovagal syncope trigger, but common triggers include:  Standing for long periods of time Heat exposure Seeing blood Having blood drawn Fear of bodily injury Straining, such as to have a bowel movement Laughing or emotions Prevention You may not always be able to avoid a vasovagal syncope episode. If you feel like you might faint, lie down and lift your legs. This allows gravity to keep blood flowing to your brain. If you can't lie down, sit down and put your head between your knees until you feel better.

## 2021-03-26 NOTE — Telephone Encounter (Signed)
PT has US imaging order faxed to them they will reach out to the patient to schedule.

## 2021-04-01 ENCOUNTER — Other Ambulatory Visit: Payer: BC Managed Care – PPO | Admitting: *Deleted

## 2021-04-08 ENCOUNTER — Ambulatory Visit: Payer: Self-pay | Admitting: *Deleted

## 2021-04-08 NOTE — Telephone Encounter (Signed)
Summary: blood in urine  ? Pt called has blood in urine  ?  ? ? ? ?Chief Complaint: blood in urine  ?Symptoms: blood,  in urine, small clot/ string blood noted in commode, feels "flushed" hot but no fever. C/o back pain since Thursday .  ?Frequency: this am  ?Pertinent Negatives: Patient denies chest pain difficulty breathing, no fever, no urination frequency, no pain or burning urination ?Disposition: [] ED /[x] Urgent Care (no appt availability in office) / [] Appointment(In office/virtual)/ []  [facility]/ [] Home Care/ [] Refused Recommended Disposition /[x] [facility]/ []  Follow-up with PCP ?Additional Notes:  ?  ?No available OV today or tomorrow. [employer]. Patient at work in [place]. Recommended UC for evaluation, please advise   ? ? ?Reason for Disposition ? Side (flank) or back pain present ? ?Answer Assessment - Initial Assessment Questions ?1. COLOR of URINE: "Describe the color of the urine."  (e.g., tea-colored, pink, red, blood clots, bloody) ?    Bloody , with string/clot of blood  ?2. ONSET: "When did the bleeding start?"  ?    This am  ?3. EPISODES: "How many times has there been blood in the urine?" or "How many times today?" ?    X 1  ?4. PAIN with URINATION: "Is there any pain with passing your urine?" If Yes, ask: "How bad is the pain?"  (Scale 1-10; or mild, moderate, severe) ?   - MILD - complains slightly about urination hurting ?   - MODERATE - interferes with normal activities   ?   - SEVERE - excruciating, unwilling or unable to urinate because of the pain  ?    No  ?5. FEVER: "Do you have a fever?" If Yes, ask: "What is your temperature, how was it measured, and when did it start?" ?    no ?6. ASSOCIATED SYMPTOMS: "Are you passing urine more frequently than usual?" ?    No  ?7. OTHER SYMPTOMS: "Do you have any other symptoms?" (e.g., back/flank pain, abdominal pain, vomiting) ?    Back pain started last Thursday and right leg  ?8. PREGNANCY: "Is there any  chance you are pregnant?" "When was your last menstrual period?" ?    na ? ?Protocols used: Urine - Blood In-A-AH ? ?

## 2021-04-09 NOTE — Telephone Encounter (Signed)
Called pt stated she was already seen at an UC and will f /u w PCP on Monday. ?

## 2021-04-15 ENCOUNTER — Ambulatory Visit: Payer: BC Managed Care – PPO | Attending: Internal Medicine | Admitting: Internal Medicine

## 2021-04-15 ENCOUNTER — Other Ambulatory Visit: Payer: Self-pay

## 2021-04-15 ENCOUNTER — Encounter: Payer: Self-pay | Admitting: Internal Medicine

## 2021-04-15 VITALS — BP 140/95 | HR 74 | Resp 16 | Ht 63.0 in | Wt 249.4 lb

## 2021-04-15 DIAGNOSIS — K5010 Crohn's disease of large intestine without complications: Secondary | ICD-10-CM

## 2021-04-15 DIAGNOSIS — Z6841 Body Mass Index (BMI) 40.0 and over, adult: Secondary | ICD-10-CM

## 2021-04-15 DIAGNOSIS — M5431 Sciatica, right side: Secondary | ICD-10-CM

## 2021-04-15 DIAGNOSIS — I10 Essential (primary) hypertension: Secondary | ICD-10-CM | POA: Diagnosis not present

## 2021-04-15 DIAGNOSIS — E782 Mixed hyperlipidemia: Secondary | ICD-10-CM | POA: Diagnosis not present

## 2021-04-15 DIAGNOSIS — R310 Gross hematuria: Secondary | ICD-10-CM

## 2021-04-15 MED ORDER — SPIRONOLACTONE 25 MG PO TABS: 12.5000 mg | ORAL_TABLET | Freq: Every day | ORAL | 3 refills | Status: DC

## 2021-04-15 NOTE — Patient Instructions (Signed)
After being on the new blood pressure medication called Spironolactone for 1 week, please return to the lab to have your potassium and kidney function checked. ?

## 2021-04-15 NOTE — Progress Notes (Signed)
? ? ?Patient ID: Andrea Wilkins, female    DOB: [birth year]  MRN: [id number] ? ?CC: Hypertension, Medication Refill, and Hospitalization Follow-up (Urgent care f/u) ? ? ?Subjective: ?Andrea Wilkins is a 56 y.o. female who presents for chronic ds management ?Her concerns today include:  ?Patient with history of HTN, HL, Crohn's disease (followed by [facility] once a yr), obesity, arthritis (psoriatic arthritis rheumatologist Dr. Luan Pulling), ACD, CHiari malformation type I, insomnia.  Elevated coronary artery calcium score ? ?HTN:  compliant with meds.  She is on verapamil, hydrochlorothiazide and Cozaar. ?Took meds already for today. ?Limits salt intake ?Checking BP recently every other day.  Range 120/80-90s ? ?HL:  taking and tolerating Crestor ? ?Obese:  referred to [facility] on last visit.  Did not hear from them.  She looked into [facility] with [facility] and plans to call them for an appt.   ?Eating habits good.  Eating smaller portions.   ?Still doing a lot of walking at work. ? ?Crohn's no flares.  Not on medication. ? ?Had 4 days of gross hematuria starting 04/08/2021. Seen at UC told she did not have UTI.  Referred for CAT scan which is scheduled for the end of this mth. ? ?Reports recent episode of right lower back pain that radiated down the right leg.  No initiating factor.  No associated numbness or tingling or loss of bowel or bladder function.  It has since resolved. ? ?Seen in the emergency room around Christmas with syncopal episode that occurred at a family gathering. ?Referred to neurology Dr. Lavell Anchors home she saw 03/26/2021.  Diagnosed with syncope likely vasovagal.  MRI of the brain ordered but it does not look like the patient had this done. ?She was also seen by the cardiologist Dr. Virgina Jock patient had work-up including cardiac CT which showed a calcium score of 17.  Nuclear stress testing negative for ischemia.  No arrhythmia captured on Apple Watch.  Thought to be vasovagal.  LDL was 159.   He increased Crestor from 10 mg daily to 40 mg daily.  Patient has not had any further episodes. ? ?HM:  reports having had c-scope done by [facility] ?Patient Active Problem List  ? Diagnosis Date Noted  ? Elevated coronary artery calcium score 03/18/2021  ? Vasovagal syncope 02/08/2021  ? Mild intermittent asthma without complication 20/80/2233  ? Chronic urticaria 10/03/2020  ? Exertional dyspnea 10/03/2020  ? Normocytic anemia 06/14/2020  ? COVID-19 vaccine series completed 06/14/2020  ? Hypertensive urgency 05/25/2020  ? Mixed hyperlipidemia 05/25/2020  ? Exertional chest pain 05/24/2020  ? Psoriatic arthritis ([place]) 08/03/2019  ? Extensor tenosynovitis of left wrist 06/13/2016  ? Ganglion cyst of wrist, left 06/13/2016  ? Class 3 severe obesity due to excess calories with serious comorbidity and body mass index (BMI) of 40.0 to 44.9 in adult [facility]) 03/14/2016  ? Left leg pain 06/08/2015  ? Intertrigo 04/02/2015  ? Scaly patch rash 04/02/2015  ? Macular erythematous rash 04/02/2015  ? Tinea corporis 03/05/2015  ? Tinea pedis 03/05/2015  ? Insomnia 03/05/2015  ? Abnormal CT scan, head 12/15/2014  ? Chiari malformation type I ([place]) 12/15/2014  ? Occipital headache 12/15/2014  ? Radicular pain in left arm 12/15/2014  ? IDA (iron deficiency anemia) 10/27/2013  ? Rheumatoid arthritis ([place]) 10/27/2013  ? Crohn's disease ([place]) 10/23/2012  ? Childhood asthma 10/23/2012  ? Tension headache 06/14/2012  ? Essential hypertension 06/14/2012  ?  ? ?Current Outpatient Medications on File Prior to Visit  ?  Medication Sig Dispense Refill  ? albuterol (VENTOLIN HFA) 108 (90 Base) MCG/ACT inhaler Inhale 2 puffs into the lungs every 4 (four) hours as needed for shortness of breath or wheezing. 1 each 1  ? hydrochlorothiazide (HYDRODIURIL) 25 MG tablet Take 1 tablet (25 mg total) by mouth daily. 30 tablet 2  ? meloxicam (MOBIC) 15 MG tablet Take 15 mg by mouth daily.    ? rosuvastatin (CRESTOR) 40 MG tablet Take 1 tablet (40 mg total)  by mouth daily. 90 tablet 3  ? verapamil (VERELAN PM) 180 MG 24 hr capsule TAKE 1 CAPSULE BY MOUTH EVERY DAY AT BEDTIME NEED OFFICE VISIT 90 capsule 0  ? zolpidem (AMBIEN) 5 MG tablet Take 1 tablet (5 mg total) by mouth at bedtime as needed for sleep. 15 tablet 0  ? ?No current facility-administered medications on file prior to visit.  ? ? ?Allergies  ?Allergen Reactions  ? Lisinopril Cough  ? Shellfish Allergy   ? Valsartan Hives  ? Benadryl Allergy  [Diphenhydramine Hcl (Sleep)] Cough  ? ? ?Social History  ? ?Socioeconomic History  ? Marital status: Married  ?  Spouse name: Not on file  ? Number of children: 2  ? Years of education: Not on file  ? Highest education level: Not on file  ?Occupational History  ? Not on file  ?Tobacco Use  ? Smoking status: Never  ? Smokeless tobacco: Never  ?Vaping Use  ? Vaping Use: Never used  ?Substance and Sexual Activity  ? Alcohol use: No  ? Drug use: No  ? Sexual activity: Yes  ?  Birth control/protection: Surgical  ?Other Topics Concern  ? Not on file  ?Social History Narrative  ? Not on file  ? ?Social Determinants of Health  ? ?Financial Resource Strain: Not on file  ?Food Insecurity: Not on file  ?Transportation Needs: Not on file  ?Physical Activity: Not on file  ?Stress: Not on file  ?Social Connections: Not on file  ?Intimate Partner Violence: Not on file  ? ? ?Family History  ?Problem Relation Age of Onset  ? Migraines Mother   ? Hypertension Mother   ? Diabetes Father   ? Migraines Sister   ? Diabetes Sister   ? Hypertension Sister   ? ? ?Past Surgical History:  ?Procedure Laterality Date  ? ABDOMINAL HYSTERECTOMY  01/27/2006  ? BREAST SURGERY  01/27/2005  ? CARPAL TUNNEL RELEASE    ? CESAREAN SECTION  01/27/1986  ? SMALL INTESTINE SURGERY  01/27/2006  ? ? ?ROS: ?Review of Systems ?Negative except as stated above ? ?PHYSICAL EXAM: ?BP (!) 148/94   Pulse 74   Resp 16   Ht 5' 3"  (1.6 m)   Wt 249 lb 6.4 oz (113.1 kg)   SpO2 98%   BMI 44.18 kg/m?   ?Wt Readings from  Last 3 Encounters:  ?04/15/21 249 lb 6.4 oz (113.1 kg)  ?03/26/21 244 lb 3.2 oz (110.8 kg)  ?03/18/21 243 lb 6.4 oz (110.4 kg)  ? ? ?Physical Exam ? ?General appearance - alert, well appearing, and in no distress ?Mental status - normal mood, behavior, speech, dress, motor activity, and thought processes ?Neck - supple, no significant adenopathy ?Chest - clear to auscultation, no wheezes, rales or rhonchi, symmetric air entry ?Heart - normal rate, regular rhythm, normal S1, S2, no murmurs, rubs, clicks or gallops ?Extremities - peripheral pulses normal, no pedal edema, no clubbing or cyanosis ? ? ?CMP Latest Ref Rng & Units 01/21/2021 01/21/2021 05/24/2020  ?  Glucose 70 - 99 mg/dL 105(H) 106(H) 87  ?BUN 6 - 20 mg/dL 16 15 11   ?Creatinine 0.44 - 1.00 mg/dL 0.90 0.96 0.70  ?Sodium 135 - 145 mmol/L 139 138 136  ?Potassium 3.5 - 5.1 mmol/L 3.9 3.9 3.6  ?Chloride 98 - 111 mmol/L 104 106 103  ?CO2 22 - 32 mmol/L - 23 27  ?Calcium 8.9 - 10.3 mg/dL - 9.2 9.1  ?Total Protein 6.5 - 8.1 g/dL - 7.3 7.2  ?Total Bilirubin 0.3 - 1.2 mg/dL - 0.5 0.5  ?Alkaline Phos 38 - 126 U/L - 95 87  ?AST 15 - 41 U/L - 19 16  ?ALT 0 - 44 U/L - 12 11  ? ?Lipid Panel  ?   ?Component Value Date/Time  ? CHOL 235 (H) 05/25/2020 0400  ? CHOL 185 05/16/2019 1637  ? TRIG 47 05/25/2020 0400  ? HDL 67 05/25/2020 0400  ? HDL 77 05/16/2019 1637  ? CHOLHDL 3.5 05/25/2020 0400  ? VLDL 9 05/25/2020 0400  ? LDLCALC 159 (H) 05/25/2020 0400  ? [place] 95 05/16/2019 1637  ? ? ?CBC ?   ?Component Value Date/Time  ? WBC 5.0 01/21/2021 1918  ? RBC 4.29 01/21/2021 1918  ? HGB 12.2 01/21/2021 1927  ? HGB 11.1 06/14/2020 1210  ? HCT 36.0 01/21/2021 1927  ? HCT 34.6 06/14/2020 1210  ? PLT 272 01/21/2021 1918  ? PLT 270 06/14/2020 1210  ? MCV 88.3 01/21/2021 1918  ? MCV 87 06/14/2020 1210  ? MCH 27.5 01/21/2021 1918  ? MCHC 31.1 01/21/2021 1918  ? RDW 13.3 01/21/2021 1918  ? RDW 13.0 06/14/2020 1210  ? LYMPHSABS 1.7 01/21/2021 1918  ? LYMPHSABS 1.5 09/16/2019 1509  ?  MONOABS 0.4 01/21/2021 1918  ? EOSABS 0.2 01/21/2021 1918  ? EOSABS 0.2 09/16/2019 1509  ? BASOSABS 0.0 01/21/2021 1918  ? BASOSABS 0.0 09/16/2019 1509  ? ? ?ASSESSMENT AND PLAN: ?1. Essential hypertension ?No

## 2021-05-10 ENCOUNTER — Other Ambulatory Visit: Payer: Self-pay | Admitting: Internal Medicine

## 2021-05-10 DIAGNOSIS — I10 Essential (primary) hypertension: Secondary | ICD-10-CM

## 2021-05-10 NOTE — Telephone Encounter (Signed)
Requested Prescriptions  ?Pending Prescriptions Disp Refills  ?? verapamil (VERELAN PM) 180 MG 24 hr capsule [Pharmacy Med Name: VERAPAMIL SR 180 MG CAPSULE] 90 capsule 0  ?  Sig: TAKE 1 CAPSULE BY MOUTH EVERY DAY AT BEDTIME NEED OFFICE VISIT  ?  ? Cardiovascular: Calcium Channel Blockers 3 Failed - 05/10/2021  2:21 AM  ?  ?  Failed - Last BP in normal range  ?  BP Readings from Last 1 Encounters:  ?04/15/21 (!) 140/95  ?   ?  ?  Passed - ALT in normal range and within 360 days  ?  ALT  ?Date Value Ref Range Status  ?01/21/2021 12 0 - 44 U/L Final  ?   ?  ?  Passed - AST in normal range and within 360 days  ?  AST  ?Date Value Ref Range Status  ?01/21/2021 19 15 - 41 U/L Final  ?   ?  ?  Passed - Cr in normal range and within 360 days  ?  Creat  ?Date Value Ref Range Status  ?03/14/2016 0.87 0.50 - 1.10 mg/dL Final  ? ?Creatinine, Ser  ?Date Value Ref Range Status  ?01/21/2021 0.90 0.44 - 1.00 mg/dL Final  ?   ?  ?  Passed - Last Heart Rate in normal range  ?  Pulse Readings from Last 1 Encounters:  ?04/15/21 74  ?   ?  ?  Passed - Valid encounter within last 6 months  ?  Recent Outpatient Visits   ?      ? 3 weeks ago Essential hypertension  ? [place] Ladell Pier, MD  ? 6 months ago Essential hypertension  ? [place] Ladell Pier, MD  ? 11 months ago Essential hypertension  ? [place] Ladell Pier, MD  ? 1 year ago Essential hypertension  ? [place], NP  ? 1 year ago Essential hypertension  ? [place], RPH-CPP  ?  ?  ?Future Appointments   ?        ? In 3 months Ladell Pier, MD [place]  ? In 4 months Patwardhan, Reynold Bowen, MD [facility]  ?  ? ?  ?  ?  ? ?

## 2021-05-17 ENCOUNTER — Other Ambulatory Visit: Payer: Self-pay | Admitting: Internal Medicine

## 2021-05-17 DIAGNOSIS — E785 Hyperlipidemia, unspecified: Secondary | ICD-10-CM

## 2021-05-17 NOTE — Telephone Encounter (Signed)
Requested medication (s) are due for refill today:   Not sure ? ?Requested medication (s) are on the active medication list:   No the 10 mg dose is listed of Crestor ? ?Future visit scheduled:   Yes ? ? ?Last ordered: 03/18/2021 #90, 3 refills for the 10 mg dose.   She is on 40 mg per OV note.  ? ?Returned for review of correct dose.   10 mg or 40 mg.     ? ?Requested Prescriptions  ?Pending Prescriptions Disp Refills  ? rosuvastatin (CRESTOR) 10 MG tablet [Pharmacy Med Name: ROSUVASTATIN CALCIUM 10 MG TAB] 90 tablet 0  ?  Sig: TAKE 1 TABLET BY MOUTH EVERY DAY  ?  ? Cardiovascular:  Antilipid - Statins 2 Failed - 05/17/2021  2:23 AM  ?  ?  Failed - Lipid Panel in normal range within the last 12 months  ?  Cholesterol, Total  ?Date Value Ref Range Status  ?05/16/2019 185 100 - 199 mg/dL Final  ? ?Cholesterol  ?Date Value Ref Range Status  ?05/25/2020 235 (H) 0 - 200 mg/dL Final  ? ?LDL Chol Calc (NIH)  ?Date Value Ref Range Status  ?05/16/2019 95 0 - 99 mg/dL Final  ? ?LDL Cholesterol  ?Date Value Ref Range Status  ?05/25/2020 159 (H) 0 - 99 mg/dL Final  ?  Comment:  ?         ?Total Cholesterol/HDL:CHD Risk ?Coronary Heart Disease Risk Table ?                    Men   Women ? 1/2 Average Risk   3.4   3.3 ? Average Risk       5.0   4.4 ? 2 X Average Risk   9.6   7.1 ? 3 X Average Risk  23.4   11.0 ?       ?Use the calculated Patient Ratio ?above and the CHD Risk Table ?to determine the patient's CHD Risk. ?       ?ATP III CLASSIFICATION (LDL): ? <100     mg/dL   Optimal ? 100-129  mg/dL   Near or Above ?                   Optimal ? 130-159  mg/dL   Borderline ? 160-189  mg/dL   High ? >190     mg/dL   Very High ?Performed at [facility], [place] [address], [place], [place] [address] ?  ? ?HDL  ?Date Value Ref Range Status  ?05/25/2020 67 >40 mg/dL Final  ?05/16/2019 77 >39 mg/dL Final  ? ?Triglycerides  ?Date Value Ref Range Status  ?05/25/2020 47 <150 mg/dL Final  ? ?  ?  ?  Passed - Cr in normal range and within  360 days  ?  Creat  ?Date Value Ref Range Status  ?03/14/2016 0.87 0.50 - 1.10 mg/dL Final  ? ?Creatinine, Ser  ?Date Value Ref Range Status  ?01/21/2021 0.90 0.44 - 1.00 mg/dL Final  ?  ?  ?  ?  Passed - Patient is not pregnant  ?  ?  Passed - Valid encounter within last 12 months  ?  Recent Outpatient Visits   ? ?      ? 1 month ago Essential hypertension  ? [place] Ladell Pier, MD  ? 7 months ago Essential hypertension  ? [place] Karle Plumber  B, MD  ? 11 months ago Essential hypertension  ? [place] Ladell Pier, MD  ? 1 year ago Essential hypertension  ? [place], NP  ? 1 year ago Essential hypertension  ? [place], RPH-CPP  ? ?  ?  ?Future Appointments   ? ?        ? In 3 months Ladell Pier, MD [place]  ? In 4 months Patwardhan, Reynold Bowen, MD [facility]  ? ?  ? ? ?  ?  ?  ? ?

## 2021-08-02 ENCOUNTER — Other Ambulatory Visit: Payer: Self-pay | Admitting: Internal Medicine

## 2021-08-02 DIAGNOSIS — I10 Essential (primary) hypertension: Secondary | ICD-10-CM

## 2021-08-02 NOTE — Telephone Encounter (Signed)
Requested Prescriptions  Pending Prescriptions Disp Refills  . verapamil (VERELAN PM) 180 MG 24 hr capsule [Pharmacy Med Name: VERAPAMIL SR 180 MG CAPSULE] 90 capsule 0    Sig: TAKE 1 CAPSULE BY MOUTH EVERY DAY AT BEDTIME NEED OFFICE VISIT     Cardiovascular: Calcium Channel Blockers 3 Failed - 08/02/2021  2:30 PM      Failed - Last BP in normal range    BP Readings from Last 1 Encounters:  04/15/21 (!) 140/95         Passed - ALT in normal range and within 360 days    ALT  Date Value Ref Range Status  01/21/2021 12 0 - 44 U/L Final         Passed - AST in normal range and within 360 days    AST  Date Value Ref Range Status  01/21/2021 19 15 - 41 U/L Final         Passed - Cr in normal range and within 360 days    Creat  Date Value Ref Range Status  03/14/2016 0.87 0.50 - 1.10 mg/dL Final   Creatinine, Ser  Date Value Ref Range Status  01/21/2021 0.90 0.44 - 1.00 mg/dL Final         Passed - Last Heart Rate in normal range    Pulse Readings from Last 1 Encounters:  04/15/21 74         Passed - Valid encounter within last 6 months    Recent Outpatient Visits          3 months ago Essential hypertension   [place], Deborah B, MD   9 months ago Essential hypertension   [place], Deborah B, MD   1 year ago Essential hypertension   [place], Deborah B, MD   2 years ago Essential hypertension   [place], [place], NP   2 years ago Essential hypertension   [place], Jarome [place], RPH-CPP      Future Appointments            In 1 week Ladell Pier, MD [place]   In 1 month Patwardhan, Reynold Bowen, MD [facility]

## 2021-08-15 ENCOUNTER — Ambulatory Visit: Payer: BC Managed Care – PPO | Admitting: Internal Medicine

## 2021-09-16 ENCOUNTER — Ambulatory Visit: Payer: BC Managed Care – PPO | Admitting: Cardiology

## 2021-09-18 ENCOUNTER — Ambulatory Visit: Payer: Self-pay | Admitting: Cardiology

## 2021-09-18 NOTE — Progress Notes (Signed)
Error

## 2021-09-23 ENCOUNTER — Other Ambulatory Visit: Payer: Self-pay | Admitting: Internal Medicine

## 2021-09-23 DIAGNOSIS — I10 Essential (primary) hypertension: Secondary | ICD-10-CM

## 2021-09-24 NOTE — Telephone Encounter (Signed)
Refilled 03/18/2021 #30 3 refills. - Should last until 10/2021. Requested Prescriptions  Pending Prescriptions Disp Refills  . spironolactone (ALDACTONE) 25 MG tablet [Pharmacy Med Name: SPIRONOLACTONE 25 MG TABLET] 45 tablet 2    Sig: TAKE 1/2 TABLET BY MOUTH EVERY DAY     Cardiovascular: Diuretics - Aldosterone Antagonist Failed - 09/23/2021  2:11 PM      Failed - Cr in normal range and within 180 days    Creat  Date Value Ref Range Status  03/14/2016 0.87 0.50 - 1.10 mg/dL Final   Creatinine, Ser  Date Value Ref Range Status  01/21/2021 0.90 0.44 - 1.00 mg/dL Final         Failed - K in normal range and within 180 days    Potassium  Date Value Ref Range Status  01/21/2021 3.9 3.5 - 5.1 mmol/L Final         Failed - Na in normal range and within 180 days    Sodium  Date Value Ref Range Status  01/21/2021 139 135 - 145 mmol/L Final  09/16/2019 140 134 - 144 mmol/L Final         Failed - eGFR is 30 or above and within 180 days    GFR, Est African American  Date Value Ref Range Status  03/14/2016 >89 >=60 mL/min Final   GFR calc Af Amer  Date Value Ref Range Status  09/16/2019 116 >59 mL/min/1.73 Final    Comment:    **[facility] currently reports eGFR in compliance with the current**   recommendations of the [employer]. [facility] will   update reporting as new guidelines are published from the NKF-ASN   Task force.    GFR, Est Non African American  Date Value Ref Range Status  03/14/2016 79 >=60 mL/min Final   GFR, Estimated  Date Value Ref Range Status  01/21/2021 >60 >60 mL/min Final    Comment:    (NOTE) Calculated using the CKD-EPI Creatinine Equation (2021)          Failed - Last BP in normal range    BP Readings from Last 1 Encounters:  04/15/21 (!) 140/95         Passed - Valid encounter within last 6 months    Recent Outpatient Visits          5 months ago Essential hypertension   [place], MD   11 months ago Essential hypertension   [place], Deborah B, MD   1 year ago Essential hypertension   [place], Deborah B, MD   2 years ago Essential hypertension   [place], [place], NP   2 years ago Essential hypertension   [place], RPH-CPP      Future Appointments            In 1 month Wynetta Emery, Dalbert Batman, MD [place]

## 2021-10-15 ENCOUNTER — Other Ambulatory Visit: Payer: Self-pay | Admitting: Internal Medicine

## 2021-10-15 DIAGNOSIS — I10 Essential (primary) hypertension: Secondary | ICD-10-CM

## 2021-10-15 NOTE — Telephone Encounter (Signed)
Requested medication (s) are due for refill today:   Yes  Requested medication (s) are on the active medication list:   Yes  Future visit scheduled:   Yes 10/17 with Dr. Wynetta Emery   Last ordered: 04/15/2021 #30, 3 refills (takes 1/2 tablet)  Returned because all labs are due per protocol.   Provider to review for refill prior to upcoming appt.   Requested Prescriptions  Pending Prescriptions Disp Refills   spironolactone (ALDACTONE) 25 MG tablet [Pharmacy Med Name: SPIRONOLACTONE 25 MG TABLET] 45 tablet 2    Sig: TAKE 1/2 TABLET BY MOUTH EVERY DAY     Cardiovascular: Diuretics - Aldosterone Antagonist Failed - 10/15/2021  2:15 AM      Failed - Cr in normal range and within 180 days    Creat  Date Value Ref Range Status  03/14/2016 0.87 0.50 - 1.10 mg/dL Final   Creatinine, Ser  Date Value Ref Range Status  01/21/2021 0.90 0.44 - 1.00 mg/dL Final         Failed - K in normal range and within 180 days    Potassium  Date Value Ref Range Status  01/21/2021 3.9 3.5 - 5.1 mmol/L Final         Failed - Na in normal range and within 180 days    Sodium  Date Value Ref Range Status  01/21/2021 139 135 - 145 mmol/L Final  09/16/2019 140 134 - 144 mmol/L Final         Failed - eGFR is 30 or above and within 180 days    GFR, Est African American  Date Value Ref Range Status  03/14/2016 >89 >=60 mL/min Final   GFR calc Af Amer  Date Value Ref Range Status  09/16/2019 116 >59 mL/min/1.73 Final    Comment:    **[facility] currently reports eGFR in compliance with the current**   recommendations of the [employer]. [facility] will   update reporting as new guidelines are published from the NKF-ASN   Task force.    GFR, Est Non African American  Date Value Ref Range Status  03/14/2016 79 >=60 mL/min Final   GFR, Estimated  Date Value Ref Range Status  01/21/2021 >60 >60 mL/min Final    Comment:    (NOTE) Calculated using the CKD-EPI Creatinine Equation (2021)           Failed - Last BP in normal range    BP Readings from Last 1 Encounters:  04/15/21 (!) 140/95         Failed - Valid encounter within last 6 months    Recent Outpatient Visits           6 months ago Essential hypertension   [place], Deborah B, MD   1 year ago Essential hypertension   [place], Deborah B, MD   1 year ago Essential hypertension   [place], Deborah B, MD   2 years ago Essential hypertension   [place], [place], NP   2 years ago Essential hypertension   [place], RPH-CPP       Future Appointments             In 4 weeks Ladell Pier, MD [place]

## 2021-10-28 ENCOUNTER — Other Ambulatory Visit: Payer: Self-pay | Admitting: Internal Medicine

## 2021-10-28 DIAGNOSIS — I10 Essential (primary) hypertension: Secondary | ICD-10-CM

## 2021-10-29 NOTE — Telephone Encounter (Signed)
Returned because a 90 day supply is being requested.

## 2021-11-03 ENCOUNTER — Other Ambulatory Visit: Payer: Self-pay | Admitting: Internal Medicine

## 2021-11-03 DIAGNOSIS — I10 Essential (primary) hypertension: Secondary | ICD-10-CM

## 2021-11-04 NOTE — Telephone Encounter (Signed)
Requested Prescriptions  Pending Prescriptions Disp Refills  . verapamil (VERELAN PM) 180 MG 24 hr capsule [Pharmacy Med Name: VERAPAMIL SR 180 MG CAPSULE] 90 capsule 0    Sig: TAKE 1 CAPSULE BY MOUTH EVERY DAY AT BEDTIME NEED OFFICE VISIT     Cardiovascular: Calcium Channel Blockers 3 Failed - 11/03/2021  9:18 AM      Failed - Last BP in normal range    BP Readings from Last 1 Encounters:  04/15/21 (!) 140/95         Failed - Valid encounter within last 6 months    Recent Outpatient Visits          6 months ago Essential hypertension   [place], Deborah B, MD   1 year ago Essential hypertension   [place], Deborah B, MD   1 year ago Essential hypertension   [place], Deborah B, MD   2 years ago Essential hypertension   [place], [place] J, NP   2 years ago Essential hypertension   [place], Stephen L, RPH-CPP      Future Appointments            In 1 week Ladell Pier, MD [place] - ALT in normal range and within 360 days    ALT  Date Value Ref Range Status  01/21/2021 12 0 - 44 U/L Final         Passed - AST in normal range and within 360 days    AST  Date Value Ref Range Status  01/21/2021 19 15 - 41 U/L Final         Passed - Cr in normal range and within 360 days    Creat  Date Value Ref Range Status  03/14/2016 0.87 0.50 - 1.10 mg/dL Final   Creatinine, Ser  Date Value Ref Range Status  01/21/2021 0.90 0.44 - 1.00 mg/dL Final         Passed - Last Heart Rate in normal range    Pulse Readings from Last 1 Encounters:  04/15/21 74

## 2021-11-12 ENCOUNTER — Encounter: Payer: Self-pay | Admitting: Internal Medicine

## 2021-11-12 ENCOUNTER — Ambulatory Visit: Payer: BC Managed Care – PPO | Attending: Internal Medicine | Admitting: Internal Medicine

## 2021-11-12 VITALS — BP 118/70 | HR 75 | Temp 98.4°F | Ht 63.0 in | Wt 238.4 lb

## 2021-11-12 DIAGNOSIS — E782 Mixed hyperlipidemia: Secondary | ICD-10-CM | POA: Diagnosis not present

## 2021-11-12 DIAGNOSIS — I10 Essential (primary) hypertension: Secondary | ICD-10-CM | POA: Diagnosis not present

## 2021-11-12 DIAGNOSIS — Z1211 Encounter for screening for malignant neoplasm of colon: Secondary | ICD-10-CM

## 2021-11-12 DIAGNOSIS — Z2821 Immunization not carried out because of patient refusal: Secondary | ICD-10-CM

## 2021-11-12 DIAGNOSIS — G4733 Obstructive sleep apnea (adult) (pediatric): Secondary | ICD-10-CM | POA: Diagnosis not present

## 2021-11-12 DIAGNOSIS — K5010 Crohn's disease of large intestine without complications: Secondary | ICD-10-CM

## 2021-11-12 MED ORDER — HYDROCHLOROTHIAZIDE 25 MG PO TABS: 25.0000 mg | ORAL_TABLET | Freq: Every day | ORAL | 1 refills | Status: DC

## 2021-11-12 MED ORDER — VERAPAMIL HCL ER 180 MG PO CP24: ORAL_CAPSULE | ORAL | 1 refills | Status: DC

## 2021-11-12 MED ORDER — ROSUVASTATIN CALCIUM 40 MG PO TABS: 40.0000 mg | ORAL_TABLET | Freq: Every day | ORAL | 1 refills | Status: DC

## 2021-11-12 MED ORDER — LOSARTAN POTASSIUM 100 MG PO TABS: 100.0000 mg | ORAL_TABLET | Freq: Every day | ORAL | 6 refills | Status: DC

## 2021-11-12 NOTE — Progress Notes (Signed)
Patient ID: Andrea Wilkins, female    DOB: 10-Jun-1965  MRN: [id number]  CC: Hypertension (Htn f/u. Discuss sleep apnea results. /No to flu vax.)   Subjective: Andrea Wilkins is a 56 y.o. female who presents for chronic ds management Her concerns today include:  Patient with history of HTN, HL, Crohn's disease (followed by [facility] once a yr), obesity, arthritis (psoriatic arthritis rheumatologist Dr. Luan Pulling), ACD, CHiari malformation type I, insomnia.  Elevated coronary artery calcium score  Recently had home sleep study through [facility].  Found to have severe sleep apnea with hypoxia with desats as low as 79%.  In-house titration study recommended.  This is scheduled for November 4.  Report said that we can try her with CPAP auto setting 5-20 is a possibility until she has the titration study. Started the weight management program on 10/13/2021.  So far she has lost 22 pounds.  She is eating healthier and getting in more steps per day.  HTN: Reports compliance with taking HCTZ, verapamil and Cozaar.  Not taking spironolactone.  Checks blood pressure about 3 times a week.  Reports good range.  Some of her readings were 121/81, 120/79.  Highest was 125/85.  HL: Reports compliance with taking Crestor 40 mg daily.  Last LDL done April of this year through [facility] was 15.  Crohn's disease: Reports that she started to have mild flareup over the past 2 weeks but she got it to quiet down.  Denies any diarrhea or blood in the stools.  She has not seen her gastroenterologist in 2 years.  Due for colonoscopy for colon cancer screening.  HM: Declines flu shot.     Patient Active Problem List   Diagnosis Date Noted   Elevated coronary artery calcium score 03/18/2021   Vasovagal syncope 02/08/2021   Mild intermittent asthma without complication 56/38/7564   Chronic urticaria 10/03/2020   Exertional dyspnea 10/03/2020   Normocytic anemia  06/14/2020   COVID-19 vaccine series completed 06/14/2020   Hypertensive urgency 05/25/2020   Mixed hyperlipidemia 05/25/2020   Exertional chest pain 05/24/2020   Psoriatic arthritis ([place]) 08/03/2019   Extensor tenosynovitis of left wrist 06/13/2016   Ganglion cyst of wrist, left 06/13/2016   Class 3 severe obesity due to excess calories with serious comorbidity and body mass index (BMI) of 40.0 to 44.9 in adult [facility]) 03/14/2016   Left leg pain 06/08/2015   Intertrigo 04/02/2015   Scaly patch rash 04/02/2015   Macular erythematous rash 04/02/2015   Tinea corporis 03/05/2015   Tinea pedis 03/05/2015   Insomnia 03/05/2015   Abnormal CT scan, head 12/15/2014   Chiari malformation type I ([place]) 12/15/2014   Occipital headache 12/15/2014   Radicular pain in left arm 12/15/2014   IDA (iron deficiency anemia) 10/27/2013   Rheumatoid arthritis ([place]) 10/27/2013   Crohn's disease ([place]) 10/23/2012   Childhood asthma 10/23/2012   Tension headache 06/14/2012   Essential hypertension 06/14/2012     Current Outpatient Medications on File Prior to Visit  Medication Sig Dispense Refill   albuterol (VENTOLIN HFA) 108 (90 Base) MCG/ACT inhaler Inhale 2 puffs into the lungs every 4 (four) hours as needed for shortness of breath or wheezing. 1 each 1   meloxicam (MOBIC) 15 MG tablet Take 15 mg by mouth daily.     zolpidem (AMBIEN) 5 MG tablet Take 1 tablet (5 mg total) by mouth at bedtime as needed for sleep. 15 tablet 0   No current facility-administered  medications on file prior to visit.    Allergies  Allergen Reactions   Lisinopril Cough   Shellfish Allergy    Valsartan Hives   Benadryl Allergy  [Diphenhydramine Hcl (Sleep)] Cough    Social History   Socioeconomic History   Marital status: Married    Spouse name: Not on file   Number of children: 2   Years of education: Not on file   Highest education level: Not on file  Occupational History   Not on file  Tobacco Use   Smoking  status: Never   Smokeless tobacco: Never  Vaping Use   Vaping Use: Never used  Substance and Sexual Activity   Alcohol use: No   Drug use: No   Sexual activity: Yes    Birth control/protection: Surgical  Other Topics Concern   Not on file  Social History Narrative   Not on file   Social Determinants of Health   Financial Resource Strain: Not on file  Food Insecurity: Not on file  Transportation Needs: Not on file  Physical Activity: Not on file  Stress: Not on file  Social Connections: Not on file  Intimate Partner Violence: Not on file    Family History  Problem Relation Age of Onset   Migraines Mother    Hypertension Mother    Diabetes Father    Migraines Sister    Diabetes Sister    Hypertension Sister     Past Surgical History:  Procedure Laterality Date   ABDOMINAL HYSTERECTOMY  01/27/2006   BREAST SURGERY  01/27/2005   CARPAL TUNNEL RELEASE     CESAREAN SECTION  01/27/1986   SMALL INTESTINE SURGERY  01/27/2006    ROS: Review of Systems Negative except as stated above  PHYSICAL EXAM: BP 118/70   Pulse 75   Temp 98.4 F (36.9 C) (Oral)   Ht 5' 3"  (1.6 m)   Wt 238 lb 6.4 oz (108.1 kg)   SpO2 99%   BMI 42.23 kg/m   Wt Readings from Last 3 Encounters:  11/12/21 238 lb 6.4 oz (108.1 kg)  04/15/21 249 lb 6.4 oz (113.1 kg)  03/26/21 244 lb 3.2 oz (110.8 kg)    Physical Exam  General appearance - alert, well appearing, and in no distress Mental status - normal mood, behavior, speech, dress, motor activity, and thought processes Neck - supple, no significant adenopathy Chest - clear to auscultation, no wheezes, rales or rhonchi, symmetric air entry Heart - normal rate, regular rhythm, normal S1, S2, no murmurs, rubs, clicks or gallops Extremities - peripheral pulses normal, no pedal edema, no clubbing or cyanosis      Latest Ref Rng & Units 01/21/2021    7:27 PM 01/21/2021    7:18 PM 05/24/2020    7:22 PM  CMP  Glucose 70 - 99 mg/dL 105  106   87   BUN 6 - 20 mg/dL 16  15  11    Creatinine 0.44 - 1.00 mg/dL 0.90  0.96  0.70   Sodium 135 - 145 mmol/L 139  138  136   Potassium 3.5 - 5.1 mmol/L 3.9  3.9  3.6   Chloride 98 - 111 mmol/L 104  106  103   CO2 22 - 32 mmol/L  23  27   Calcium 8.9 - 10.3 mg/dL  9.2  9.1   Total Protein 6.5 - 8.1 g/dL  7.3  7.2   Total Bilirubin 0.3 - 1.2 mg/dL  0.5  0.5   Alkaline Phos  38 - 126 U/L  95  87   AST 15 - 41 U/L  19  16   ALT 0 - 44 U/L  12  11    Lipid Panel     Component Value Date/Time   CHOL 235 (H) 05/25/2020 0400   CHOL 185 05/16/2019 1637   TRIG 47 05/25/2020 0400   HDL 67 05/25/2020 0400   HDL 77 05/16/2019 1637   CHOLHDL 3.5 05/25/2020 0400   VLDL 9 05/25/2020 0400   LDLCALC 159 (H) 05/25/2020 0400   LDLCALC 95 05/16/2019 1637    CBC    Component Value Date/Time   WBC 5.0 01/21/2021 1918   RBC 4.29 01/21/2021 1918   HGB 12.2 01/21/2021 1927   HGB 11.1 06/14/2020 1210   HCT 36.0 01/21/2021 1927   HCT 34.6 06/14/2020 1210   PLT 272 01/21/2021 1918   PLT 270 06/14/2020 1210   MCV 88.3 01/21/2021 1918   MCV 87 06/14/2020 1210   MCH 27.5 01/21/2021 1918   MCHC 31.1 01/21/2021 1918   RDW 13.3 01/21/2021 1918   RDW 13.0 06/14/2020 1210   LYMPHSABS 1.7 01/21/2021 1918   LYMPHSABS 1.5 09/16/2019 1509   MONOABS 0.4 01/21/2021 1918   EOSABS 0.2 01/21/2021 1918   EOSABS 0.2 09/16/2019 1509   BASOSABS 0.0 01/21/2021 1918   BASOSABS 0.0 09/16/2019 1509    ASSESSMENT AND PLAN:  1. OSA (obstructive sleep apnea) Advised patient that I can write a prescription for CPAP with auto setting for her.  However if it turns out that she needs BiPAP, I do not know that her insurance would then pay for a BiPAP device as well.  We both agreed that it would be best to wait until she has the titration study on the fourth of next month.  She can then notify me with the results and I can write for which ever device is recommended  2. Essential hypertension At goal.  Continue  hydrochlorothiazide, verapamil and Cozaar. - hydrochlorothiazide (HYDRODIURIL) 25 MG tablet; Take 1 tablet (25 mg total) by mouth daily.  Dispense: 90 tablet; Refill: 1 - verapamil (VERELAN PM) 180 MG 24 hr capsule; TAKE 1 CAPSULE BY MOUTH EVERY DAY AT BEDTIME NEED OFFICE VISIT  Dispense: 90 capsule; Refill: 1  3. Mixed hyperlipidemia LDL at goal.  Continue Crestor - rosuvastatin (CRESTOR) 40 MG tablet; Take 1 tablet (40 mg total) by mouth daily.  Dispense: 90 tablet; Refill: 1  4. Morbid obesity ([place]) Commended her on weight loss so far.  Encouraged her to continue healthy eating habits and trying to move more.  5. Crohn's disease of large intestine without complication ([place]) Recommend referral for follow-up with her gastroenterologist for this.  However patient declines at this time.  She had a mild flare that has settled.  She is agreeable however for referral for the colonoscopy for colon cancer screening.  Advised that she is at higher risk for colon cancer given her diagnosis of inflammatory bowel disease.  6. Screening for colon cancer - Ambulatory referral to Gastroenterology  7. Influenza vaccination declined Recommended.  Patient declined.   Patient was given the opportunity to ask questions.  Patient verbalized understanding of the plan and was able to repeat key elements of the plan.   This documentation was completed using Radio producer.  Any transcriptional errors are unintentional.  Orders Placed This Encounter  Procedures   Ambulatory referral to Gastroenterology     Requested Prescriptions   Signed Prescriptions  Disp Refills   rosuvastatin (CRESTOR) 40 MG tablet 90 tablet 1    Sig: Take 1 tablet (40 mg total) by mouth daily.   hydrochlorothiazide (HYDRODIURIL) 25 MG tablet 90 tablet 1    Sig: Take 1 tablet (25 mg total) by mouth daily.   losartan (COZAAR) 100 MG tablet 30 tablet 6    Sig: Take 1 tablet (100 mg total) by mouth daily.    verapamil (VERELAN PM) 180 MG 24 hr capsule 90 capsule 1    Sig: TAKE 1 CAPSULE BY MOUTH EVERY DAY AT BEDTIME NEED OFFICE VISIT    Return in about 4 months (around 03/15/2022).  Karle Plumber, MD, FACP

## 2021-12-25 ENCOUNTER — Encounter: Payer: Self-pay | Admitting: Internal Medicine

## 2021-12-25 NOTE — Progress Notes (Signed)
Colonoscopy report from [facility] done by Dr. Carol Ada 11/11/2018 received.  Colonoscopy revealed scattered small and large mouth diverticula in the sigmoid colon descending colon and ascending colon.  Repeat in 10 years.

## 2022-01-28 ENCOUNTER — Emergency Department (HOSPITAL_COMMUNITY)
Admission: EM | Admit: 2022-01-28 | Discharge: 2022-01-28 | Disposition: A | Discharge status: Home / Self Care | Payer: BC Managed Care – PPO | Attending: Emergency Medicine | Admitting: Emergency Medicine

## 2022-01-28 ENCOUNTER — Emergency Department (HOSPITAL_COMMUNITY): Payer: BC Managed Care – PPO

## 2022-01-28 DIAGNOSIS — R112 Nausea with vomiting, unspecified: Secondary | ICD-10-CM | POA: Diagnosis present

## 2022-01-28 DIAGNOSIS — I10 Essential (primary) hypertension: Secondary | ICD-10-CM | POA: Diagnosis not present

## 2022-01-28 DIAGNOSIS — Z1152 Encounter for screening for COVID-19: Secondary | ICD-10-CM | POA: Diagnosis not present

## 2022-01-28 DIAGNOSIS — R197 Diarrhea, unspecified: Secondary | ICD-10-CM | POA: Insufficient documentation

## 2022-01-28 DIAGNOSIS — Z79899 Other long term (current) drug therapy: Secondary | ICD-10-CM | POA: Diagnosis not present

## 2022-01-28 DIAGNOSIS — R519 Headache, unspecified: Secondary | ICD-10-CM | POA: Insufficient documentation

## 2022-01-28 LAB — CBC
HCT: 37.5 % (ref 36.0–46.0)
Hemoglobin: 12.3 g/dL (ref 12.0–15.0)
MCH: 29.4 pg (ref 26.0–34.0)
MCHC: 32.8 g/dL (ref 30.0–36.0)
MCV: 89.7 fL (ref 80.0–100.0)
Platelets: 210 10*3/uL (ref 150–400)
RBC: 4.18 MIL/uL (ref 3.87–5.11)
RDW: 13.2 % (ref 11.5–15.5)
WBC: 4.7 10*3/uL (ref 4.0–10.5)
nRBC: 0 % (ref 0.0–0.2)

## 2022-01-28 LAB — DIFFERENTIAL
Abs Immature Granulocytes: 0.01 10*3/uL (ref 0.00–0.07)
Basophils Absolute: 0 10*3/uL (ref 0.0–0.1)
Basophils Relative: 0 %
Eosinophils Absolute: 0.1 10*3/uL (ref 0.0–0.5)
Eosinophils Relative: 1 %
Immature Granulocytes: 0 %
Lymphocytes Relative: 7 %
Lymphs Abs: 0.3 10*3/uL — ABNORMAL LOW (ref 0.7–4.0)
Monocytes Absolute: 0.2 10*3/uL (ref 0.1–1.0)
Monocytes Relative: 5 %
Neutro Abs: 4.1 10*3/uL (ref 1.7–7.7)
Neutrophils Relative %: 87 %

## 2022-01-28 LAB — COMPREHENSIVE METABOLIC PANEL
ALT: 22 U/L (ref 0–44)
AST: 23 U/L (ref 15–41)
Albumin: 4.3 g/dL (ref 3.5–5.0)
Alkaline Phosphatase: 78 U/L (ref 38–126)
Anion gap: 11 (ref 5–15)
BUN: 10 mg/dL (ref 6–20)
CO2: 23 mmol/L (ref 22–32)
Calcium: 9.2 mg/dL (ref 8.9–10.3)
Chloride: 104 mmol/L (ref 98–111)
Creatinine, Ser: 0.7 mg/dL (ref 0.44–1.00)
GFR, Estimated: >60 mL/min (ref >60)
Glucose, Bld: 134 mg/dL — ABNORMAL HIGH (ref 70–99)
Potassium: 4.2 mmol/L (ref 3.5–5.1)
Sodium: 138 mmol/L (ref 135–145)
Total Bilirubin: 0.7 mg/dL (ref 0.3–1.2)
Total Protein: 7.3 g/dL (ref 6.5–8.1)

## 2022-01-28 LAB — URINALYSIS, ROUTINE W REFLEX MICROSCOPIC
Bilirubin Urine: NEGATIVE
Glucose, UA: NEGATIVE mg/dL
Hgb urine dipstick: NEGATIVE
Ketones, ur: NEGATIVE mg/dL
Nitrite: NEGATIVE
Protein, ur: 100 mg/dL — AB
Specific Gravity, Urine: 1.024 (ref 1.005–1.030)
pH: 7 (ref 5.0–8.0)

## 2022-01-28 LAB — I-STAT BETA HCG BLOOD, ED (MC, WL, AP ONLY): I-stat hCG, quantitative: <5 m[IU]/mL (ref <5)

## 2022-01-28 LAB — RESP PANEL BY RT-PCR (RSV, FLU A&B, COVID)  RVPGX2
Influenza A by PCR: NEGATIVE
Influenza B by PCR: NEGATIVE
Resp Syncytial Virus by PCR: NEGATIVE
SARS Coronavirus 2 by RT PCR: NEGATIVE

## 2022-01-28 LAB — LIPASE, BLOOD: Lipase: 26 U/L (ref 11–51)

## 2022-01-28 MED ORDER — ONDANSETRON HCL 4 MG/2ML IJ SOLN
4.0000 mg | Freq: Once | INTRAMUSCULAR | Status: AC
  Administered 2022-01-28: 4 mg via INTRAVENOUS
  Filled 2022-01-28: qty 2

## 2022-01-28 MED ORDER — ONDANSETRON HCL 4 MG PO TABS: 4.0000 mg | ORAL_TABLET | Freq: Three times a day (TID) | ORAL | 0 refills | Status: DC | PRN

## 2022-01-28 MED ORDER — METOCLOPRAMIDE HCL 5 MG/ML IJ SOLN
10.0000 mg | Freq: Once | INTRAMUSCULAR | Status: AC
  Administered 2022-01-28: 10 mg via INTRAVENOUS
  Filled 2022-01-28: qty 2

## 2022-01-28 MED ORDER — DEXAMETHASONE SODIUM PHOSPHATE 10 MG/ML IJ SOLN
10.0000 mg | Freq: Once | INTRAMUSCULAR | Status: AC
  Administered 2022-01-28: 10 mg via INTRAVENOUS
  Filled 2022-01-28: qty 1

## 2022-01-28 MED ORDER — IOHEXOL 350 MG/ML SOLN
75.0000 mL | Freq: Once | INTRAVENOUS | Status: AC | PRN
  Administered 2022-01-28: 75 mL via INTRAVENOUS

## 2022-01-28 NOTE — ED Triage Notes (Signed)
Patient with Crohn's disease here with complaint of diarrhea, vomiting, and headache that started yesterday at approximately 1700. Patient is alert, oriented, speaking in complete sentences, is not actively vomiting, and is in no apparent distress at this time.

## 2022-01-28 NOTE — ED Provider Triage Note (Signed)
Emergency Medicine Provider Triage Evaluation Note  Andrea Wilkins , a 57 y.o. female  was evaluated in triage.  Pt complains of nausea, vomiting, diarrhea.  Also having generalized body aches, dysuria.  History of Crohn's disease, not on immunomodulator medicine.  Feeling bad since symptom 100 this morning.  Endorses fevers and chills..  Review of Systems  Per HPI  Physical Exam  BP (!) 148/98   Pulse (!) 104   Temp 100 F (37.8 C)   Resp 18   SpO2 97%  Gen:   Awake, no distress   Resp:  Normal effort  MSK:   Moves extremities without difficulty  Other:  Diffuse abdominal tenderness  Medical Decision Making  Medically screening exam initiated at 7:45 AM.  Appropriate orders placed.  Jilliann Subramanian was informed that the remainder of the evaluation will be completed by another provider, this initial triage assessment does not replace that evaluation, and the importance of remaining in the ED until their evaluation is complete.  Sherrill Raring, PA-C 01/28/22 0745

## 2022-01-28 NOTE — Discharge Instructions (Addendum)
You were seen today for nausea, vomiting, diarrhea, and a headache.  Your CT scan was reassuring for no signs of any life-threatening conditions at this time that would be causing your abdominal discomfort.  I have prescribed Zofran to help with your nausea.  Please take as prescribed. Please resume your home medications for your headaches including over-the-counter medications such as acetaminophen and ibuprofen.  I recommend following up with your primary care provider for further evaluation and management. If you develop any life-threatening symptoms such as shortness of breath, chest pain, or altered level of consciousness, please return for reevaluation at the emergency department

## 2022-01-28 NOTE — ED Provider Notes (Signed)
[facility] EMERGENCY DEPARTMENT Provider Note   CSN: [id number] Arrival date & time: 01/28/22  2505     History  Chief Complaint  Patient presents with   Diarrhea    Andrea Wilkins is a 57 y.o. female.  Patient presents to the emergency department complaining of diarrhea, nausea, vomiting, and headache which began yesterday afternoon at approximate 5:00.  Patient endorses a history of Crohn's disease.  She denies urinary symptoms, vaginal discharge, chest pain, shortness of breath.  The patient states that her headache feels like previous migraines.  She does not take any prophylactic migraine medication at home.  She states that in the past she has been treated with a headache "cocktail" in the emergency department for the same.  Past medical history significant for iron deficiency anemia, rheumatoid arthritis, type I show inflammation, occipital headaches, hypertension, Crohn's disease  HPI     Home Medications Prior to Admission medications   Medication Sig Start Date End Date Taking? Authorizing Provider  ondansetron (ZOFRAN) 4 MG tablet Take 1 tablet (4 mg total) by mouth every 8 (eight) hours as needed for nausea or vomiting. 01/28/22  Yes Dorothyann Peng, PA-C  albuterol (VENTOLIN HFA) 108 (90 Base) MCG/ACT inhaler Inhale 2 puffs into the lungs every 4 (four) hours as needed for shortness of breath or wheezing. 10/03/20   Dara Hoyer, FNP  hydrochlorothiazide (HYDRODIURIL) 25 MG tablet Take 1 tablet (25 mg total) by mouth daily. 11/12/21   Ladell Pier, MD  losartan (COZAAR) 100 MG tablet Take 1 tablet (100 mg total) by mouth daily. 11/12/21   Ladell Pier, MD  meloxicam (MOBIC) 15 MG tablet Take 15 mg by mouth daily. 11/02/19   [provider]  rosuvastatin (CRESTOR) 40 MG tablet Take 1 tablet (40 mg total) by mouth daily. 11/12/21   Ladell Pier, MD  verapamil (VERELAN PM) 180 MG 24 hr capsule TAKE 1 CAPSULE BY MOUTH EVERY DAY AT  BEDTIME NEED OFFICE VISIT 11/12/21   Ladell Pier, MD  zolpidem (AMBIEN) 5 MG tablet Take 1 tablet (5 mg total) by mouth at bedtime as needed for sleep. 08/23/20   Ladell Pier, MD      Allergies    Lisinopril, Shellfish allergy, Valsartan, and Benadryl allergy  [diphenhydramine hcl (sleep)]    Review of Systems   Review of Systems  Respiratory:  Negative for shortness of breath.   Cardiovascular:  Negative for chest pain.  Gastrointestinal:  Positive for abdominal pain, diarrhea, nausea and vomiting.  Neurological:  Positive for headaches.    Physical Exam Updated Vital Signs BP (!) 150/92 (BP Location: Right Arm)   Pulse (!) 102   Temp 99.5 F (37.5 C) (Oral)   Resp 18   SpO2 100%  Physical Exam Vitals and nursing note reviewed.  Constitutional:      General: She is not in acute distress.    Appearance: She is well-developed.  HENT:     Head: Normocephalic and atraumatic.  Eyes:     Extraocular Movements: Extraocular movements intact.     Conjunctiva/sclera: Conjunctivae normal.     Pupils: Pupils are equal, round, and reactive to light.  Cardiovascular:     Rate and Rhythm: Normal rate and regular rhythm.     Heart sounds: No murmur heard. Pulmonary:     Effort: Pulmonary effort is normal. No respiratory distress.     Breath sounds: Normal breath sounds.  Abdominal:     Palpations: Abdomen  is soft.     Tenderness: There is no abdominal tenderness.  Musculoskeletal:        General: No swelling.     Cervical back: Neck supple.  Skin:    General: Skin is warm and dry.     Capillary Refill: Capillary refill takes less than 2 seconds.  Neurological:     General: No focal deficit present.     Mental Status: She is alert.     Comments: Cranial nerves II through VII, XI, XII intact  Psychiatric:        Mood and Affect: Mood normal.     ED Results / Procedures / Treatments   Labs (all labs ordered are listed, but only abnormal results are  displayed) Labs Reviewed  COMPREHENSIVE METABOLIC PANEL - Abnormal; Notable for the following components:      Result Value   Glucose, Bld 134 (*)    All other components within normal limits  URINALYSIS, ROUTINE W REFLEX MICROSCOPIC - Abnormal; Notable for the following components:   APPearance HAZY (*)    Protein, ur 100 (*)    Leukocytes,Ua TRACE (*)    Bacteria, UA RARE (*)    All other components within normal limits  DIFFERENTIAL - Abnormal; Notable for the following components:   Lymphs Abs 0.3 (*)    All other components within normal limits  RESP PANEL BY RT-PCR (RSV, FLU A&B, COVID)  RVPGX2  LIPASE, BLOOD  CBC  I-STAT BETA HCG BLOOD, ED ([facility], [facility], [facility] ONLY)    EKG None  Radiology CT Abdomen Pelvis W Contrast  Result Date: 01/28/2022 CLINICAL DATA:  Diarrhea with vomiting again headaches since yesterday. Crohn's disease exacerbation. EXAM: CT ABDOMEN AND PELVIS WITH CONTRAST TECHNIQUE: Multidetector CT imaging of the abdomen and pelvis was performed using the standard protocol following bolus administration of intravenous contrast. RADIATION DOSE REDUCTION: This exam was performed according to the departmental dose-optimization program which includes automated exposure control, adjustment of the mA and/or kV according to patient size and/or use of iterative reconstruction technique. CONTRAST:  64mL OMNIPAQUE IOHEXOL 350 MG/ML SOLN COMPARISON:  Abdominopelvic CT 10/23/2012. FINDINGS: Limited contrast bolus with limited intravascular and parenchymal opacification. Lower chest: Clear lung bases. No significant pleural or pericardial effusion. Hepatobiliary: The liver is normal in density without suspicious focal abnormality. There is a 1.9 cm cyst posteriorly in the right hepatic lobe. Probable small dependent gallstone in the gallbladder lumen. No evidence of gallbladder wall thickening, surrounding inflammation or biliary ductal dilatation. Pancreas: Unremarkable. No pancreatic ductal  dilatation or surrounding inflammatory changes. Spleen: Normal in size without focal abnormality. Adrenals/Urinary Tract: Both adrenal glands appear normal. No evidence of urinary tract calculus, suspicious renal lesion or hydronephrosis. The bladder appears unremarkable for its degree of distention, although is nearly empty and suboptimally evaluated. Stomach/Bowel: No enteric contrast administered. The stomach and proximal small bowel appear normal. There are small bowel anastomosis clips in the right lower quadrant without associated bowel distension, wall thickening or surrounding inflammation. The appendix appears normal. Scattered mild colonic diverticulosis without evidence of acute inflammation. Vascular/Lymphatic: There are no enlarged abdominal or pelvic lymph nodes. Mild aortic and branch vessel atherosclerosis without acute vascular findings. Reproductive: Hysterectomy.  No adnexal mass. Other: No evidence of abdominal wall mass or hernia. No ascites. Musculoskeletal: No acute or significant osseous findings. Mild facet hypertrophy in the lower lumbar spine. The sacroiliac joints appear unremarkable. IMPRESSION: 1. No acute findings or explanation for the patient's symptoms. No evidence of active Crohn's disease  status post distal small bowel resection. 2. Mild colonic diverticulosis without evidence of acute inflammation. 3. Probable small gallstone without evidence of cholecystitis or biliary ductal dilatation. 4.  Aortic Atherosclerosis (ICD10-I70.0). Electronically Signed   By: Carey Bullocks M.D.   On: 01/28/2022 10:02    Procedures Procedures    Medications Ordered in ED Medications  iohexol (OMNIPAQUE) 350 MG/ML injection 75 mL (75 mLs Intravenous Contrast Given 01/28/22 0942)  ondansetron (ZOFRAN) injection 4 mg (4 mg Intravenous Given 01/28/22 1618)  metoCLOPramide (REGLAN) injection 10 mg (10 mg Intravenous Given 01/28/22 1621)  dexamethasone (DECADRON) injection 10 mg (10 mg Intravenous  Given 01/28/22 1615)    ED Course/ Medical Decision Making/ A&P                           Medical Decision Making Amount and/or Complexity of Data Reviewed Labs: ordered.  Risk Prescription drug management.   This patient presents to the ED for concern of abdominal pain, nausea, vomiting, and headache, this involves an extensive number of treatment options, and is a complaint that carries with it a high risk of complications and morbidity.  The differential diagnosis includes flare of Crohn's disease, gastroenteritis, colitis, appendicitis, cholecystitis, pancreatitis, and others.  Differential for the headache includes intracranial abnormality, migraine, tension headache, cluster headaches, and others   Co morbidities that complicate the patient evaluation  History of occipital headaches, possible migraine history, history Crohn's disease   Additional history obtained:  Additional history obtained from family at bedside External records from outside source obtained and reviewed including notes from pulmonology with recent studies for obstructive sleep apnea   Lab Tests:  I Ordered, and personally interpreted labs.  The pertinent results include: Unremarkable CMP, CBC, lipase, negative respiratory panel, UA not indicative of infection  Imaging Studies ordered:  I ordered imaging studies including CT abdomen pelvis with contrast I independently visualized and interpreted imaging which showed  1. No acute findings or explanation for the patient's symptoms. No  evidence of active Crohn's disease status post distal small bowel  resection.  2. Mild colonic diverticulosis without evidence of acute  inflammation.  3. Probable small gallstone without evidence of cholecystitis or  biliary ductal dilatation.  4.  Aortic Atherosclerosis   I agree with the radiologist interpretation   Problem List / ED Course / Critical interventions / Medication management   I ordered medication  including Zofran for nausea, Decadron and Reglan for headache  Reevaluation of the patient after these medicines showed that the patient improved I have reviewed the patients home medicines and have made adjustments as needed  Test / Admission - Considered:  No apparent Crohn's flare on CT scan.  No acute findings to show the cause of her pain or other symptoms.  She does have diverticulosis but no signs of diverticulitis.  She also has what appears to be a small gallstone but no evidence of cholecystitis or biliary ductal dilatation.  At this time her symptoms seem most consistent with a gastroenteritis.  The patient does feel better after Zofran and her nausea is under control at this time. I discussed the headache with the patient.  This is not the worst headache she has had.  She states it feels similar to previous "migraines" that she has had in the past.  She is feeling much better after medication administration.  I see no indication at this time for admission.  Plan to discharge home with prescription for  Zofran and recommendation for follow-up with primary care as needed        Final Clinical Impression(s) / ED Diagnoses Final diagnoses:  Nausea vomiting and diarrhea  Acute nonintractable headache, unspecified headache type    Rx / DC Orders ED Discharge Orders          Ordered    ondansetron (ZOFRAN) 4 MG tablet  Every 8 hours PRN        01/28/22 1707              Pamala Duffel 01/28/22 1707    Pricilla Loveless, MD 01/29/22 1534

## 2022-02-12 ENCOUNTER — Ambulatory Visit: Payer: BC Managed Care – PPO | Admitting: Nurse Practitioner

## 2022-03-03 ENCOUNTER — Ambulatory Visit: Payer: BC Managed Care – PPO | Admitting: Nurse Practitioner

## 2022-05-18 ENCOUNTER — Emergency Department (HOSPITAL_BASED_OUTPATIENT_CLINIC_OR_DEPARTMENT_OTHER)
Admission: EM | Admit: 2022-05-18 | Discharge: 2022-05-18 | Disposition: A | Discharge status: Home / Self Care | Payer: BC Managed Care – PPO | Attending: Emergency Medicine | Admitting: Emergency Medicine

## 2022-05-18 ENCOUNTER — Other Ambulatory Visit: Payer: Self-pay

## 2022-05-18 ENCOUNTER — Emergency Department (HOSPITAL_BASED_OUTPATIENT_CLINIC_OR_DEPARTMENT_OTHER): Payer: BC Managed Care – PPO

## 2022-05-18 DIAGNOSIS — M25561 Pain in right knee: Secondary | ICD-10-CM | POA: Insufficient documentation

## 2022-05-18 DIAGNOSIS — M79604 Pain in right leg: Secondary | ICD-10-CM | POA: Diagnosis present

## 2022-05-18 MED ORDER — KETOROLAC TROMETHAMINE 30 MG/ML IJ SOLN
30.0000 mg | Freq: Once | INTRAMUSCULAR | Status: AC
  Administered 2022-05-18: 30 mg via INTRAMUSCULAR
  Filled 2022-05-18: qty 1

## 2022-05-18 MED ORDER — CYCLOBENZAPRINE HCL 10 MG PO TABS: 10.0000 mg | ORAL_TABLET | Freq: Two times a day (BID) | ORAL | 0 refills | Status: DC | PRN

## 2022-05-18 NOTE — ED Provider Notes (Signed)
 [place] EMERGENCY DEPARTMENT AT [facility] Provider Note   CSN: [id number] Arrival date & time: 05/18/22  1628     History Chief Complaint  Patient presents with   Leg Pain    Andrea Wilkins is a 57 y.o. female.  Patient presents emergency department complaints of right leg pain.  She reports that she has had right leg pain and swelling for the last few days.  Patient denies any recent falls or injuries to the area.  No prior history of any acute abnormalities in the area.  Prior meniscus repair surgery about 10 years ago.  Not currently any blood thinners.  Denies any shortness of breath.  No prior history of any DVTs.  Leg Pain      Home Medications Prior to Admission medications   Medication Sig Start Date End Date Taking? Authorizing Provider  cyclobenzaprine (FLEXERIL) 10 MG tablet Take 1 tablet (10 mg total) by mouth 2 (two) times daily as needed for muscle spasms. 05/18/22  Yes Smitty Knudsen, PA-C  albuterol (VENTOLIN HFA) 108 (90 Base) MCG/ACT inhaler Inhale 2 puffs into the lungs every 4 (four) hours as needed for shortness of breath or wheezing. 10/03/20   Hetty Blend, FNP  hydrochlorothiazide (HYDRODIURIL) 25 MG tablet Take 1 tablet (25 mg total) by mouth daily. 11/12/21   Marcine Matar, MD  losartan (COZAAR) 100 MG tablet Take 1 tablet (100 mg total) by mouth daily. 11/12/21   Marcine Matar, MD  meloxicam (MOBIC) 15 MG tablet Take 15 mg by mouth daily. 11/02/19   [provider]  ondansetron (ZOFRAN) 4 MG tablet Take 1 tablet (4 mg total) by mouth every 8 (eight) hours as needed for nausea or vomiting. 01/28/22   Darrick Grinder, PA-C  rosuvastatin (CRESTOR) 40 MG tablet Take 1 tablet (40 mg total) by mouth daily. 11/12/21   Marcine Matar, MD  verapamil (VERELAN PM) 180 MG 24 hr capsule TAKE 1 CAPSULE BY MOUTH EVERY DAY AT BEDTIME NEED OFFICE VISIT 11/12/21   Marcine Matar, MD  zolpidem (AMBIEN) 5 MG tablet Take 1 tablet (5 mg  total) by mouth at bedtime as needed for sleep. 08/23/20   Marcine Matar, MD      Allergies    Lisinopril, Shellfish allergy, Valsartan, and Benadryl allergy  [diphenhydramine hcl (sleep)]    Review of Systems   Review of Systems  Musculoskeletal:  Positive for joint swelling.  All other systems reviewed and are negative.   Physical Exam Updated Vital Signs BP 134/80   Pulse 82   Temp 98 F (36.7 C) (Oral)   Resp 20   Ht 5\' 3"  (1.6 m)   Wt 108.9 kg   SpO2 98%   BMI 42.51 kg/m  Physical Exam Vitals and nursing note reviewed.  Constitutional:      General: She is not in acute distress.    Appearance: She is well-developed.  HENT:     Head: Normocephalic and atraumatic.  Eyes:     Conjunctiva/sclera: Conjunctivae normal.  Cardiovascular:     Rate and Rhythm: Normal rate and regular rhythm.     Heart sounds: No murmur heard. Pulmonary:     Effort: Pulmonary effort is normal. No respiratory distress.     Breath sounds: Normal breath sounds.  Abdominal:     Palpations: Abdomen is soft.     Tenderness: There is no abdominal tenderness.  Musculoskeletal:        General: Swelling present. No  tenderness, deformity or signs of injury. Normal range of motion.     Cervical back: Neck supple.     Right lower leg: No edema.     Left lower leg: No edema.     Comments: Mild swelling noted in right leg compared to left leg  Skin:    General: Skin is warm and dry.     Capillary Refill: Capillary refill takes less than 2 seconds.  Neurological:     Mental Status: She is alert.  Psychiatric:        Mood and Affect: Mood normal.     ED Results / Procedures / Treatments   Labs (all labs ordered are listed, but only abnormal results are displayed) Labs Reviewed - No data to display  EKG None  Radiology US Venous Img Lower Unilateral Right (DVT)  Result Date: 05/18/2022 CLINICAL DATA:  Right knee pain and right hip pain. EXAM: RIGHT LOWER EXTREMITY VENOUS DOPPLER  ULTRASOUND TECHNIQUE: Gray-scale sonography with compression, as well as color and duplex ultrasound, were performed to evaluate the deep venous system(s) from the level of the common femoral vein through the popliteal and proximal calf veins. COMPARISON:  None Available. FINDINGS: VENOUS Normal compressibility of the common femoral, superficial femoral, and popliteal veins, as well as the visualized calf veins. Visualized portions of profunda femoral vein and great saphenous vein unremarkable. No filling defects to suggest DVT on grayscale or color Doppler imaging. Doppler waveforms show normal direction of venous flow, normal respiratory plasticity and response to augmentation. Limited views of the contralateral common femoral vein are unremarkable. OTHER None. Limitations: none IMPRESSION: Negative. Electronically Signed   By: Aram Candela M.D.   On: 05/18/2022 19:36    Procedures Procedures   Medications Ordered in ED Medications  ketorolac (TORADOL) 30 MG/ML injection 30 mg (has no administration in time range)    ED Course/ Medical Decision Making/ A&P                           Medical Decision Making Risk Prescription drug management.   This patient presents to the ED for concern of leg pain.  Differential diagnosis includes edema, lymphedema, DVT, muscle strain, rhabdomyolysis   Imaging Studies ordered:  I ordered imaging studies including ultrasound right lower extremity I independently visualized and interpreted imaging which showed no evidence of DVT I agree with the radiologist interpretation   Medicines ordered and prescription drug management:  I ordered medication including Toradol for pain Reevaluation of the patient after these medicines showed that the patient improved I have reviewed the patients home medicines and have made adjustments as needed   Problem List / ED Course:  Patient presents emergency department complaints of leg pain.  She reports that  she has had this leg pain and swelling for the last few days without any specific cause.  No recent injuries.  Denies any recent resurgence in physical activity or exercise program.  Low suspicion for rhabdomyolysis at this time.  Ultrasound ordered from triage which was negative for any evidence of DVT. Patient appears to have some joint line tenderness in the right knee so most likely due to degenerative changes.  Advised patient that she should follow-up with primary care provider as well as orthopedic specialist for further evaluation.  Will provide patient with a dose of Toradol here in the emergency department.  Will also write patient with a knee wrap and crutches for added comfort and mobility.  Patient is agreeable to treatment plan verbalized understanding all return precautions.  All questions answered prior to patient discharge. Prescription for Flexeril sent to patient's pharmacy for further treatment of spasms as patient reported that she feels that she has had some periodic cramping in the leg.  Final Clinical Impression(s) / ED Diagnoses Final diagnoses:  Acute pain of right knee    Rx / DC Orders ED Discharge Orders          Ordered    cyclobenzaprine (FLEXERIL) 10 MG tablet  2 times daily PRN        05/18/22 2100              Smitty Knudsen, PA-C 05/18/22 2115    Loetta Rough, MD 05/20/22 1419

## 2022-05-18 NOTE — Discharge Instructions (Addendum)
You were seen in the emergency department for right knee pain. Your ultrasound did not reveal any evidence of a DVT or other abnormality. I would recommend following up with your primary care provider for further evaluation as well as an orthopedic specialist to ensure you can get further evaluation if your symptoms are not improving.

## 2022-05-18 NOTE — ED Triage Notes (Signed)
Arrives POV from home. NAD.  Complains of right calf pain x4 days. NSAIDs with no relief. Notes swelling in this leg only. Has HX sciatica as well. No trauma or falls.

## 2022-05-18 NOTE — ED Notes (Signed)
U/S tech in room at present

## 2022-05-20 ENCOUNTER — Other Ambulatory Visit: Payer: Self-pay | Admitting: Internal Medicine

## 2022-05-20 DIAGNOSIS — I10 Essential (primary) hypertension: Secondary | ICD-10-CM

## 2022-05-20 NOTE — Telephone Encounter (Signed)
Attempted to call patient to schedule appointment- left message to call office. Courtesy 30 day Rx given.  Requested Prescriptions  Pending Prescriptions Disp Refills   hydrochlorothiazide (HYDRODIURIL) 25 MG tablet [Pharmacy Med Name: HYDROCHLOROTHIAZIDE 25 MG TAB] 90 tablet 1    Sig: TAKE 1 TABLET (25 MG TOTAL) BY MOUTH DAILY.     Cardiovascular: Diuretics - Thiazide Failed - 05/20/2022  1:34 AM      Failed - Valid encounter within last 6 months    Recent Outpatient Visits           6 months ago OSA (obstructive sleep apnea)   [place] [facility] Marcine Matar, MD   1 year ago Essential hypertension   [place] [facility] Marcine Matar, MD   1 year ago Essential hypertension   [place] [facility] Marcine Matar, MD   1 year ago Essential hypertension   [facility] Marcine Matar, MD   2 years ago Essential hypertension    [facility] [place], [place], NP              Passed - Cr in normal range and within 180 days    Creat  Date Value Ref Range Status  03/14/2016 0.87 0.50 - 1.10 mg/dL Final   Creatinine, Ser  Date Value Ref Range Status  01/28/2022 0.70 0.44 - 1.00 mg/dL Final         Passed - K in normal range and within 180 days    Potassium  Date Value Ref Range Status  01/28/2022 4.2 3.5 - 5.1 mmol/L Final         Passed - Na in normal range and within 180 days    Sodium  Date Value Ref Range Status  01/28/2022 138 135 - 145 mmol/L Final  09/16/2019 140 134 - 144 mmol/L Final         Passed - Last BP in normal range    BP Readings from Last 1 Encounters:  05/18/22 134/80

## 2022-05-24 ENCOUNTER — Ambulatory Visit: Payer: BC Managed Care – PPO | Attending: Family | Admitting: Family

## 2022-05-24 ENCOUNTER — Inpatient Hospital Stay: Payer: BC Managed Care – PPO | Admitting: Family

## 2022-05-24 ENCOUNTER — Encounter: Payer: Self-pay | Admitting: Family

## 2022-05-24 DIAGNOSIS — I10 Essential (primary) hypertension: Secondary | ICD-10-CM | POA: Diagnosis not present

## 2022-05-24 DIAGNOSIS — Z09 Encounter for follow-up examination after completed treatment for conditions other than malignant neoplasm: Secondary | ICD-10-CM

## 2022-05-24 DIAGNOSIS — E782 Mixed hyperlipidemia: Secondary | ICD-10-CM

## 2022-05-24 DIAGNOSIS — M79606 Pain in leg, unspecified: Secondary | ICD-10-CM | POA: Diagnosis not present

## 2022-05-24 MED ORDER — ROSUVASTATIN CALCIUM 40 MG PO TABS: 40.0000 mg | ORAL_TABLET | Freq: Every day | ORAL | 1 refills | Status: DC

## 2022-05-24 MED ORDER — VERAPAMIL HCL ER 180 MG PO CP24: ORAL_CAPSULE | ORAL | 1 refills | Status: DC

## 2022-05-24 MED ORDER — CYCLOBENZAPRINE HCL 10 MG PO TABS: 10.0000 mg | ORAL_TABLET | Freq: Two times a day (BID) | ORAL | 0 refills | Status: AC | PRN

## 2022-05-24 MED ORDER — HYDROCHLOROTHIAZIDE 25 MG PO TABS: 25.0000 mg | ORAL_TABLET | Freq: Every day | ORAL | 0 refills | Status: DC

## 2022-05-24 NOTE — Progress Notes (Signed)
-Patient authorized this video visit to take place while she is at home and the provider is at the clinic.  All her questions were answered.  Patient has been counseled on age-appropriate routine health concerns for screening and prevention. These are reviewed and up-to-date. Referrals have been placed accordingly. Immunizations are up-to-date or declined.     Subjective:  Andrea Wilkins is a 57 year old female seen this morning via video visit for hospital follow-up.  Patient was seen at the local emergency room on May 18, 2022 with complaints of pain to the right calf for 4 days.  She has a PM history of sciatica.  Patient lab work as well as ultrasound were negative for DVT.  Patient was discharged home in stable condition with prescription of Flexeril 10 mg to take by mouth twice a day as needed, with instruction to follow up with primary care and orthopedics.  Patient was seen by Dr. Nicki Guadalajara on Wednesday, May 21, 2022, x-rays of leg and back were obtained and patient was prescribed muscle relaxants and prednisone. Patient reports relief from the medications and she still has enough pills.  Patient reports she was told by the provider to reach out to his office if the medications do not help or symptoms worsen with the plan to refer her to the surgery services. Patient reports her symptoms come and go, and they worsen when she is walking. Patient reports no chest pain, shortness of breath, fever, chills, redness or pain to the right calf or any other symptom during this visit.  She reports improvement since her discharge from the local emergency room.  Patient request refills of her blood pressure and cholesterol medications as well, reports blood pressure well-controlled.    Review of Systems  Constitutional: Negative.   HENT: Negative.    Respiratory: Negative.    Cardiovascular: Negative.   Gastrointestinal: Negative.   Musculoskeletal:        Patient reports intermittent pain  to the right calf worsened by ambulating.  Neurological: Negative.   Endo/Heme/Allergies: Negative.     Past Medical History:  Diagnosis Date   Arthritis 2015   Childhood asthma 10/23/2012   Crohn's disease (HCC)    Hyperlipidemia    Hypertension Dx 2013   Urticaria     Past Surgical History:  Procedure Laterality Date   BREAST SURGERY  01/27/2005   CARPAL TUNNEL RELEASE     CESAREAN SECTION  01/27/1986   SMALL INTESTINE SURGERY  01/27/2006   TOTAL ABDOMINAL HYSTERECTOMY  01/27/2006    Family History  Problem Relation Age of Onset   Migraines Mother    Hypertension Mother    Diabetes Father    Migraines Sister    Diabetes Sister    Hypertension Sister     Social History Reviewed with no changes to be made today.   Outpatient Medications Prior to Visit  Medication Sig Dispense Refill   albuterol (VENTOLIN HFA) 108 (90 Base) MCG/ACT inhaler Inhale 2 puffs into the lungs every 4 (four) hours as needed for shortness of breath or wheezing. 1 each 1   cyclobenzaprine (FLEXERIL) 10 MG tablet Take 1 tablet (10 mg total) by mouth 2 (two) times daily as needed for muscle spasms. 20 tablet 0   hydrochlorothiazide (HYDRODIURIL) 25 MG tablet TAKE 1 TABLET (25 MG TOTAL) BY MOUTH DAILY. 30 tablet 0   losartan (COZAAR) 100 MG tablet Take 1 tablet (100 mg total) by mouth daily. 30 tablet 6   meloxicam (MOBIC) 15  MG tablet Take 15 mg by mouth daily.     ondansetron (ZOFRAN) 4 MG tablet Take 1 tablet (4 mg total) by mouth every 8 (eight) hours as needed for nausea or vomiting. 20 tablet 0   rosuvastatin (CRESTOR) 40 MG tablet Take 1 tablet (40 mg total) by mouth daily. 90 tablet 1   verapamil (VERELAN PM) 180 MG 24 hr capsule TAKE 1 CAPSULE BY MOUTH EVERY DAY AT BEDTIME NEED OFFICE VISIT 90 capsule 1   zolpidem (AMBIEN) 5 MG tablet Take 1 tablet (5 mg total) by mouth at bedtime as needed for sleep. 15 tablet 0   No facility-administered medications prior to visit.    Allergies   Allergen Reactions   Lisinopril Cough   Shellfish Allergy    Valsartan Hives   Benadryl Allergy  [Diphenhydramine Hcl (Sleep)] Cough       Objective:    There were no vitals taken for this visit. Wt Readings from Last 3 Encounters:  05/18/22 240 lb (108.9 kg)  11/12/21 238 lb 6.4 oz (108.1 kg)  04/15/21 249 lb 6.4 oz (113.1 kg)    Physical Exam Constitutional:      Appearance: Normal appearance.  Neurological:     Mental Status: She is alert and oriented to person, place, and time.   Due to the nature of this visit, physical exam is limited to the above systems.  Assessment & Plan:      1) Hospital discharge follow-up:  -Continue taking Flexeril and Prednisone as prescribed  -Report new or worsening symptoms to the clinic or local emergency room  -Follow-up with Dr. Nicki Guadalajara as indicated  -Keep pressure off your leg as needed  -Elevate your leg when sitting   -Follow-up with your primary care provider in 3 months  2) Leg pain:   -Report new or worsening symptoms to the clinic or local emergency room  -Follow-up with Dr. Nicki Guadalajara as indicated  -Keep pressure off your leg as needed  -Elevate your leg when sitting   -Take medications as prescribed  2) essential hypertension:  -Increase physical activities as tolerated and decrease salt consumption in your diet  -Take blood pressure medications as indicated  -Refills sent to pharmacy  3) Hyperlipidemia, Mixed:  -Increase physical activities and eat plant-based diet (vegetables)  -Decrease red meat consumption  -Medication refilled, take it as prescribed  -Follow-up in 3 months with your primary care provider         Patient has been counseled extensively about nutrition and exercise as well as the importance of adherence with medications and regular follow-up. The patient was given clear instructions to go to ER or return to medical center if symptoms don't improve, worsen or new problems develop. The  patient verbalized understanding.    Follow-up: Return in about 3 months (around 08/23/2022).         Catarina Hartshorn, DNP, APRN, FNP-C [facility] and [facility] [place], [place] [phone]   05/24/2022, 8:57 AM

## 2022-05-24 NOTE — Patient Instructions (Addendum)
1) Continue taking your medications as prescribed 2) Follow up with Dr. Laural Benes in 3 months  3) report new or worsening symptoms to the clinic or local emergency room. 4) if medication prescribed by Dr. Susa Simmonds do not improve your symptoms, please reach out to his office as indicated. 5) your blood pressure medications and cholesterol medication where refilled.

## 2022-07-12 ENCOUNTER — Other Ambulatory Visit: Payer: Self-pay | Admitting: Family

## 2022-07-12 DIAGNOSIS — I10 Essential (primary) hypertension: Secondary | ICD-10-CM

## 2022-07-14 NOTE — Telephone Encounter (Signed)
Requested medication (s) are due for refill today: Yes  Requested medication (s) are on the active medication list: Yes  Last refill:  05/24/22  Future visit scheduled: Yes  Notes to clinic:  Pharmacy requests 90 day supply and diagnosis code.    Requested Prescriptions  Pending Prescriptions Disp Refills   hydrochlorothiazide (HYDRODIURIL) 25 MG tablet [Pharmacy Med Name: HYDROCHLOROTHIAZIDE 25 MG TAB] 90 tablet 1    Sig: Take 1 tablet (25 mg total) by mouth daily.     Cardiovascular: Diuretics - Thiazide Passed - 07/12/2022  4:04 AM      Passed - Cr in normal range and within 180 days    Creat  Date Value Ref Range Status  03/14/2016 0.87 0.50 - 1.10 mg/dL Final   Creatinine, Ser  Date Value Ref Range Status  01/28/2022 0.70 0.44 - 1.00 mg/dL Final         Passed - K in normal range and within 180 days    Potassium  Date Value Ref Range Status  01/28/2022 4.2 3.5 - 5.1 mmol/L Final         Passed - Na in normal range and within 180 days    Sodium  Date Value Ref Range Status  01/28/2022 138 135 - 145 mmol/L Final  09/16/2019 140 134 - 144 mmol/L Final         Passed - Last BP in normal range    BP Readings from Last 1 Encounters:  05/18/22 134/80         Passed - Valid encounter within last 6 months    Recent Outpatient Visits           1 month ago Hospital discharge follow-up   [facility] [place], Jomarie Longs, FNP   8 months ago OSA (obstructive sleep apnea)   [place] [facility] Marcine Matar, MD   1 year ago Essential hypertension   [facility] [facility] Marcine Matar, MD   1 year ago Essential hypertension   [facility] Marcine Matar, MD   2 years ago Essential hypertension    [facility] Marcine Matar, MD       Future Appointments             In 1 month Laural Benes, Binnie Rail, MD  [facility]

## 2022-08-26 ENCOUNTER — Ambulatory Visit: Payer: BC Managed Care – PPO | Attending: Internal Medicine | Admitting: Internal Medicine

## 2022-08-26 ENCOUNTER — Encounter: Payer: Self-pay | Admitting: Internal Medicine

## 2022-08-26 VITALS — BP 137/92 | HR 81 | Temp 98.5°F | Ht 63.0 in | Wt 242.0 lb

## 2022-08-26 DIAGNOSIS — J22 Unspecified acute lower respiratory infection: Secondary | ICD-10-CM | POA: Diagnosis not present

## 2022-08-26 DIAGNOSIS — I10 Essential (primary) hypertension: Secondary | ICD-10-CM | POA: Diagnosis not present

## 2022-08-26 DIAGNOSIS — K5010 Crohn's disease of large intestine without complications: Secondary | ICD-10-CM

## 2022-08-26 DIAGNOSIS — Z1231 Encounter for screening mammogram for malignant neoplasm of breast: Secondary | ICD-10-CM

## 2022-08-26 DIAGNOSIS — L4050 Arthropathic psoriasis, unspecified: Secondary | ICD-10-CM

## 2022-08-26 DIAGNOSIS — M79604 Pain in right leg: Secondary | ICD-10-CM

## 2022-08-26 DIAGNOSIS — G4733 Obstructive sleep apnea (adult) (pediatric): Secondary | ICD-10-CM

## 2022-08-26 DIAGNOSIS — E782 Mixed hyperlipidemia: Secondary | ICD-10-CM | POA: Diagnosis not present

## 2022-08-26 DIAGNOSIS — M0600 Rheumatoid arthritis without rheumatoid factor, unspecified site: Secondary | ICD-10-CM

## 2022-08-26 MED ORDER — BENZONATATE 100 MG PO CAPS: 100.0000 mg | ORAL_CAPSULE | Freq: Two times a day (BID) | ORAL | 0 refills | Status: AC | PRN

## 2022-08-26 NOTE — Patient Instructions (Addendum)
We will order a Doppler ultrasound on your right leg to be done later this week. I have sent a prescription to your pharmacy for [facility] for cough. With the clinical pharmacist in 1 month for repeat blood pressure check.

## 2022-08-26 NOTE — Progress Notes (Signed)
Patient ID: Andrea Wilkins, female    DOB: Jun 01, 1965  MRN: [id number]  CC: Hypertension (HTN f/u. Ottis Stain on R leg x3 mo/Cough - phlegm, congestion, sinus X2 days)   Subjective: Andrea Wilkins is a 57 y.o. female who presents for chronic disease management Her concerns today include:  Patient with history of HTN, HL, Crohn's disease (followed by [facility] once a yr), obesity, arthritis (psoriatic arthritis rheumatologist Dr. Juanetta Gosling), ACD, CHiari malformation type I, insomnia.  Elevated coronary artery calcium score   C/o sneezing, dry cough, nose and sinus drainage, sinus congestion since yesterday.  No fever or SOB Took Nyquil last night. No COVID test [place] had low grade fever and cold; neg for COVID.    Complains of pain and increased sensitivity in the right lower leg below the knee x 3 months.  Anterior muscles sensitive to touch.  Soreness in the popliteal area.  Hurts to put pressure on the leg.  Leg gets feeling of being hot at times and has swelling.  Also burns.  Takes ibuprofen and elevate the leg which helps a little.  No known injury.  OSA: now has CPAP.  Tolerating well and benefits from use  HTN: taking all 3 and takes at nights: Verapamil 180 mg daily, Cozaar 100 mg daily, HCTZ 25 mg daily Checks BP regularly.  Range usually  120-125/81-85  HL: on Crestor; taking and tolerating  Crohn's:  no issues with flare since last visit.  Has not seen GI in quite a while.  Reports she was called by [facility] and told she does not need to be seen unless she is having a flare.  Looks like consult also declined stating that pt not due for c-scope until 2030.  Sees Rheumatologist regularly.  Last 2 mths ago.  Treated for psoriatic arthritis and seronegative RA.  On meloxicam and doing well on this medicine.  HM:  due for MMG and Zoster.   Patient Active Problem List   Diagnosis Date Noted   Elevated coronary artery calcium score 03/18/2021   Vasovagal syncope  02/08/2021   Mild intermittent asthma without complication 10/15/2020   Chronic urticaria 10/03/2020   Exertional dyspnea 10/03/2020   Normocytic anemia 06/14/2020   COVID-19 vaccine series completed 06/14/2020   Hypertensive urgency 05/25/2020   Mixed hyperlipidemia 05/25/2020   Exertional chest pain 05/24/2020   Psoriatic arthritis (HCC) 08/03/2019   Extensor tenosynovitis of left wrist 06/13/2016   Ganglion cyst of wrist, left 06/13/2016   Class 3 severe obesity due to excess calories with serious comorbidity and body mass index (BMI) of 40.0 to 44.9 in adult [facility]) 03/14/2016   Left leg pain 06/08/2015   Intertrigo 04/02/2015   Scaly patch rash 04/02/2015   Macular erythematous rash 04/02/2015   Tinea corporis 03/05/2015   Tinea pedis 03/05/2015   Insomnia 03/05/2015   Abnormal CT scan, head 12/15/2014   Chiari malformation type I (HCC) 12/15/2014   Occipital headache 12/15/2014   Radicular pain in left arm 12/15/2014   IDA (iron deficiency anemia) 10/27/2013   Rheumatoid arthritis (HCC) 10/27/2013   Crohn's disease (HCC) 10/23/2012   Childhood asthma 10/23/2012   Tension headache 06/14/2012   Essential hypertension 06/14/2012     Current Outpatient Medications on File Prior to Visit  Medication Sig Dispense Refill   albuterol (VENTOLIN HFA) 108 (90 Base) MCG/ACT inhaler Inhale 2 puffs into the lungs every 4 (four) hours as needed for shortness of breath or wheezing. 1 each 1   cyclobenzaprine (  FLEXERIL) 10 MG tablet Take 1 tablet (10 mg total) by mouth 2 (two) times daily as needed for muscle spasms. 20 tablet 0   hydrochlorothiazide (HYDRODIURIL) 25 MG tablet TAKE 1 TABLET (25 MG TOTAL) BY MOUTH DAILY. 90 tablet 0   losartan (COZAAR) 100 MG tablet Take 1 tablet (100 mg total) by mouth daily. 30 tablet 6   meloxicam (MOBIC) 15 MG tablet Take 15 mg by mouth daily.     rosuvastatin (CRESTOR) 40 MG tablet Take 1 tablet (40 mg total) by mouth daily. 90 tablet 1   verapamil  (VERELAN) 180 MG 24 hr capsule TAKE 1 CAPSULE BY MOUTH EVERY DAY AT BEDTIME NEED OFFICE VISIT 90 capsule 1   No current facility-administered medications on file prior to visit.    Allergies  Allergen Reactions   Lisinopril Cough   Shellfish Allergy    Valsartan Hives   Benadryl Allergy  [Diphenhydramine Hcl (Sleep)] Cough    Social History   Socioeconomic History   Marital status: Married    Spouse name: Not on file   Number of children: 2   Years of education: Not on file   Highest education level: Not on file  Occupational History   Not on file  Tobacco Use   Smoking status: Never   Smokeless tobacco: Never  Vaping Use   Vaping status: Never Used  Substance and Sexual Activity   Alcohol use: No   Drug use: No   Sexual activity: Yes    Birth control/protection: Surgical  Other Topics Concern   Not on file  Social History Narrative   Not on file   Social Determinants of Health   Financial Resource Strain: Not on file  Food Insecurity: Not on file  Transportation Needs: Not on file  Physical Activity: Not on file  Stress: Not on file  Social Connections: Unknown (06/10/2021)   [facility]   Social Network    Social Network: Not on file  Intimate Partner Violence: Unknown (05/01/2021)   Received from [facility]   HITS    Physically Hurt: Not on file    Insult or Talk Down To: Not on file    Threaten Physical Harm: Not on file    Scream or Curse: Not on file    Family History  Problem Relation Age of Onset   Migraines Mother    Hypertension Mother    Diabetes Father    Migraines Sister    Diabetes Sister    Hypertension Sister     Past Surgical History:  Procedure Laterality Date   BREAST SURGERY  01/27/2005   CARPAL TUNNEL RELEASE     CESAREAN SECTION  01/27/1986   SMALL INTESTINE SURGERY  01/27/2006   TOTAL ABDOMINAL HYSTERECTOMY  01/27/2006    ROS: Review of Systems Negative except as stated above  PHYSICAL EXAM: BP  (!) 137/92   Pulse 81   Temp 98.5 F (36.9 C) (Oral)   Ht 5\' 3"  (1.6 m)   Wt 242 lb (109.8 kg)   SpO2 100%   BMI 42.87 kg/m   Physical Exam  General appearance - alert, well appearing, middle-age obese African-American female and in no distress.  She has mild audible congestion. Mental status - normal mood, behavior, speech, dress, motor activity, and thought processes Nose -mild enlargement of nasal turbinates Mouth - mucous membranes moist, pharynx normal without lesions Neck - supple, no significant adenopathy Chest - clear to auscultation, no wheezes, rales or rhonchi, symmetric air  entry Heart - normal rate, regular rhythm, normal S1, S2, no murmurs, rubs, clicks or gallops Extremities -no lower extremity edema.  Mild sensitivity on palpation of the right lower extremity below the knee.  No tenderness on palpation of the right calf.  Dorsalis pedis, posterior tibialis and popliteal pulses 3+.  Mild discomfort on palpation of the popliteal area on the right.      Latest Ref Rng & Units 01/28/2022    7:47 AM 01/21/2021    7:27 PM 01/21/2021    7:18 PM  CMP  Glucose 70 - 99 mg/dL 244  010  272   BUN 6 - 20 mg/dL 10  16  15    Creatinine 0.44 - 1.00 mg/dL 5.36  6.44  0.34   Sodium 135 - 145 mmol/L 138  139  138   Potassium 3.5 - 5.1 mmol/L 4.2  3.9  3.9   Chloride 98 - 111 mmol/L 104  104  106   CO2 22 - 32 mmol/L 23   23   Calcium 8.9 - 10.3 mg/dL 9.2   9.2   Total Protein 6.5 - 8.1 g/dL 7.3   7.3   Total Bilirubin 0.3 - 1.2 mg/dL 0.7   0.5   Alkaline Phos 38 - 126 U/L 78   95   AST 15 - 41 U/L 23   19   ALT 0 - 44 U/L 22   12    Lipid Panel     Component Value Date/Time   CHOL 235 (H) 05/25/2020 0400   CHOL 185 05/16/2019 1637   TRIG 47 05/25/2020 0400   HDL 67 05/25/2020 0400   HDL 77 05/16/2019 1637   CHOLHDL 3.5 05/25/2020 0400   VLDL 9 05/25/2020 0400   LDLCALC 159 (H) 05/25/2020 0400   LDLCALC 95 05/16/2019 1637    CBC    Component Value Date/Time   WBC  4.7 01/28/2022 0747   RBC 4.18 01/28/2022 0747   HGB 12.3 01/28/2022 0747   HGB 11.1 06/14/2020 1210   HCT 37.5 01/28/2022 0747   HCT 34.6 06/14/2020 1210   PLT 210 01/28/2022 0747   PLT 270 06/14/2020 1210   MCV 89.7 01/28/2022 0747   MCV 87 06/14/2020 1210   MCH 29.4 01/28/2022 0747   MCHC 32.8 01/28/2022 0747   RDW 13.2 01/28/2022 0747   RDW 13.0 06/14/2020 1210   LYMPHSABS 0.3 (L) 01/28/2022 0747   LYMPHSABS 1.5 09/16/2019 1509   MONOABS 0.2 01/28/2022 0747   EOSABS 0.1 01/28/2022 0747   EOSABS 0.2 09/16/2019 1509   BASOSABS 0.0 01/28/2022 0747   BASOSABS 0.0 09/16/2019 1509    ASSESSMENT AND PLAN: 1. Acute respiratory infection Common cold versus COVID. Agreeable to COVID test. Tessalon Perles as needed.  Coricidin HBP over-the-counter as needed.  Declines work excuse. - Novel Coronavirus, NAA ([facility]) - benzonatate (TESSALON) 100 MG capsule; Take 1 capsule (100 mg total) by mouth 2 (two) times daily as needed for cough.  Dispense: 20 capsule; Refill: 0  2. Pain in right leg Of questionable etiology but sounds to have neuropathy component.  Will start with getting Doppler ultrasound to rule out Baker's cyst versus DVT.  If negative, we can try her with low-dose of gabapentin or Lyrica. - VAS [place] LOWER EXTREMITY VENOUS (DVT); Future  3. Essential hypertension Repeat blood pressure better but not at goal.  Recommend adding another medication but patient declines at this time.  She is agreeable to seeing the clinical pharmacist in 1  month and follow-up instead.  She will bring blood pressure readings with her.  Continue HCTZ 25 mg, Cozaar 100 mg and verapamil 180 mg daily.  4. Mixed hyperlipidemia Continue Crestor 40 mg daily  5. OSA on CPAP Encouraged her to continue to use the CPAP nightly  6. Crohn's disease of large intestine without complication (HCC) Stable.  Not on any medications at this time.  7. Seronegative rheumatoid arthritis (HCC) 8. Psoriatic arthritis  (HCC) Followed by rheumatology for both.  On meloxicam; no flare at this time.  9. Encounter for screening mammogram for malignant neoplasm of breast - MM Digital Screening; Future    Patient was given the opportunity to ask questions.  Patient verbalized understanding of the plan and was able to repeat key elements of the plan.   This documentation was completed using Paediatric nurse.  Any transcriptional errors are unintentional.  Orders Placed This Encounter  Procedures   Novel Coronavirus, NAA ([facility])   MM Digital Screening   VAS [place] LOWER EXTREMITY VENOUS (DVT)     Requested Prescriptions   Signed Prescriptions Disp Refills   benzonatate (TESSALON) 100 MG capsule 20 capsule 0    Sig: Take 1 capsule (100 mg total) by mouth 2 (two) times daily as needed for cough.    Return in about 4 months (around 12/27/2022) for Appt with [facility] in 4 wks for BP check.  Jonah Blue, MD, FACP

## 2022-08-27 ENCOUNTER — Telehealth: Payer: Self-pay | Admitting: Internal Medicine

## 2022-08-27 MED ORDER — NIRMATRELVIR/RITONAVIR (PAXLOVID)TABLET: 3.0000 | ORAL_TABLET | Freq: Two times a day (BID) | ORAL | 0 refills | Status: AC

## 2022-08-27 NOTE — Telephone Encounter (Signed)
Phone call placed to patient today.  Patient informed that she tested positive for COVID.  I recommend that she stay out of work at least 5 days and until symptoms improve.  Patient sounded congested.  Advised that she wear a mask when around others.  Discussed use of Paxlovid to help prevent hospitalization.  Went over possible side effects of the medication including change in taste, diarrhea.  Advised that she would need to hold off on taking the rosuvastatin while she is taking the medication and for 7 days afterwards.  She is agreeable to taking the Paxlovid.  All questions were answered.

## 2022-08-28 ENCOUNTER — Encounter: Payer: Self-pay | Admitting: Internal Medicine

## 2022-08-28 ENCOUNTER — Ambulatory Visit: Payer: Self-pay | Admitting: *Deleted

## 2022-08-28 ENCOUNTER — Encounter (HOSPITAL_COMMUNITY): Payer: Self-pay

## 2022-08-28 ENCOUNTER — Ambulatory Visit (HOSPITAL_COMMUNITY): Payer: BC Managed Care – PPO

## 2022-08-28 NOTE — Telephone Encounter (Signed)
Message from Lennox Pippins sent at 08/28/2022  3:44 PM EDT  Summary: Cough Headache Positive for covid/medication not working   Patient called in and stated she saw Dr Laural Benes on 08/26/2022 and patient tested positive for covid. Patient stated that Dr Laural Benes gave her a packet of medication for covid but it is not helping. Packet of medicine was given to patient yesterday, 08/27/2022 Patient stated she still has the symptoms of headache and coughing and needs advice on what to do, if she could take something else with the packet, or if something else can be called in since she is still having symptoms.  Please advise.         Chief Complaint: headache Symptoms: dry cough, chills and hot flashes  Pertinent Negatives: Patient denies SOB, chest pain  Disposition: [] ED /[] Urgent Care (no appt availability in office) / [] Appointment(In office/virtual)/ []  [place] [facility]/ [x] Home Care/ [] Refused Recommended Disposition /[] [facility]/ []  Follow-up with PCP Additional Notes: advised home care and Flonase, honey 2 teaspoons at bedtime and elevate HOB  Reason for Disposition  Cough with cold symptoms (e.g., runny nose, postnasal drip, throat clearing)  Answer Assessment - Initial Assessment Questions 2. SEVERITY: "How bad is the cough today?"      Coughiong epeisodes not severe- freq. Dry cough  3. SPUTUM: "Describe the color of your sputum" (none, dry cough; clear, white, yellow, green)     N/a 4. HEMOPTYSIS: "Are you coughing up any blood?" If so ask: "How much?" (flecks, streaks, tablespoons, etc.)     N/a 5. DIFFICULTY BREATHING: "Are you having difficulty breathing?" If Yes, ask: "How bad is it?" (e.g., mild, moderate, severe)    - MILD: No SOB at rest, mild SOB with walking, speaks normally in sentences, can lie down, no retractions, pulse < 100.    - MODERATE: SOB at rest, SOB with minimal exertion and prefers to sit, cannot lie down flat, speaks in phrases, mild retractions,  audible wheezing, pulse 100-120.    - SEVERE: Very SOB at rest, speaks in single words, struggling to breathe, sitting hunched forward, retractions, pulse > 120      Mild  6. FEVER: "Do you have a fever?" If Yes, ask: "What is your temperature, how was it measured, and when did it start?"     Chllls and body aches   9. PE RISK FACTORS: "Do you have a history of blood clots?" (or: recent major surgery, recent prolonged travel, bedridden)     N/a 10. OTHER SYMPTOMS: "Do you have any other symptoms?" (e.g., runny nose, wheezing, chest pain)       headache  Protocols used: Cough - Acute Non-Productive-A-AH

## 2022-08-28 NOTE — Telephone Encounter (Signed)
Summary: Cough Headache Positive for covid/medication not working   Patient called in and stated she saw Dr Laural Benes on 08/26/2022 and patient tested positive for covid. Patient stated that Dr Laural Benes gave her a packet of medication for covid but it is not helping. Packet of medicine was given to patient yesterday, 08/27/2022 Patient stated she still has the symptoms of headache and coughing and needs advice on what to do, if she could take something else with the packet, or if something else can be called in since she is still having symptoms.  Please advise.        Called patient # [phone] to review ongoing sx and covid positive status. No answer, LVMTCB [phone].

## 2022-09-01 ENCOUNTER — Other Ambulatory Visit: Payer: Self-pay | Admitting: Family

## 2022-09-01 DIAGNOSIS — E782 Mixed hyperlipidemia: Secondary | ICD-10-CM

## 2022-09-02 NOTE — Telephone Encounter (Signed)
Requested medication (s) are due for refill today: Yes  Requested medication (s) are on the active medication list: Yes  Last refill:  05/24/22  Future visit scheduled: No  Notes to clinic:  Unable to refill per protocol due to failed labs, no updated results.      Requested Prescriptions  Pending Prescriptions Disp Refills   rosuvastatin (CRESTOR) 40 MG tablet [Pharmacy Med Name: ROSUVASTATIN CALCIUM 40 MG TAB] 90 tablet 1    Sig: TAKE 1 TABLET BY MOUTH EVERY DAY     Cardiovascular:  Antilipid - Statins 2 Failed - 09/01/2022  4:54 PM      Failed - Lipid Panel in normal range within the last 12 months    Cholesterol, Total  Date Value Ref Range Status  05/16/2019 185 100 - 199 mg/dL Final   Cholesterol  Date Value Ref Range Status  05/25/2020 235 (H) 0 - 200 mg/dL Final   LDL Chol Calc (NIH)  Date Value Ref Range Status  05/16/2019 95 0 - 99 mg/dL Final   LDL Cholesterol  Date Value Ref Range Status  05/25/2020 159 (H) 0 - 99 mg/dL Final    Comment:           Total Cholesterol/HDL:CHD Risk Coronary Heart Disease Risk Table                     Men   Women  1/2 Average Risk   3.4   3.3  Average Risk       5.0   4.4  2 X Average Risk   9.6   7.1  3 X Average Risk  23.4   11.0        Use the calculated Patient Ratio above and the CHD Risk Table to determine the patient's CHD Risk.        ATP III CLASSIFICATION (LDL):  <100     mg/dL   Optimal  540-981  mg/dL   Near or Above                    Optimal  130-159  mg/dL   Borderline  191-478  mg/dL   High  >295     mg/dL   Very High Performed at [facility], [address] [place], [place] [address]    HDL  Date Value Ref Range Status  05/25/2020 67 >40 mg/dL Final  86/57/8469 77 >62 mg/dL Final   Triglycerides  Date Value Ref Range Status  05/25/2020 47 <150 mg/dL Final         Passed - Cr in normal range and within 360 days    Creat  Date Value Ref Range Status  03/14/2016 0.87 0.50 - 1.10 mg/dL  Final   Creatinine, Ser  Date Value Ref Range Status  01/28/2022 0.70 0.44 - 1.00 mg/dL Final         Passed - Patient is not pregnant      Passed - Valid encounter within last 12 months    Recent Outpatient Visits           1 week ago Acute respiratory infection   [facility] Marcine Matar, MD   3 months ago Hospital discharge follow-up   [facility] [place], FNP   9 months ago OSA (obstructive sleep apnea)   [facility] Marcine Matar, MD   1 year ago  Essential hypertension   [facility] Marcine Matar, MD   1 year ago Essential hypertension   [place] [facility] Marcine Matar, MD

## 2022-09-26 ENCOUNTER — Ambulatory Visit: Payer: BC Managed Care – PPO

## 2022-09-26 NOTE — Progress Notes (Signed)
DOB was confirmed   Patient sat for 5-10 min before blood pressure was taken  Arm was elevated as well

## 2022-12-04 ENCOUNTER — Telehealth: Payer: Self-pay

## 2022-12-04 NOTE — Telephone Encounter (Signed)
Copied from CRM [id number]. Topic: Appointment Scheduling - Scheduling Inquiry for Clinic >> Dec 04, 2022 11:05 AM Marlow Baars wrote: Reason for CRM: The patient called in stating she was scheduled for an U/S of her right leg a few months ago but she came down with covid and never went. She would like her provider to send a new order so she can now get the U/S done. Please assist patient further.

## 2022-12-05 NOTE — Telephone Encounter (Signed)
Patient given apt 12/30/2022 at 1410  Patent verbalized understanding.

## 2022-12-05 NOTE — Telephone Encounter (Signed)
Since it has been more than 3 months, she would need to be reevaluated to see whether this study is still necessary.  Please give her an appointment.

## 2022-12-06 ENCOUNTER — Emergency Department (HOSPITAL_BASED_OUTPATIENT_CLINIC_OR_DEPARTMENT_OTHER): Payer: BC Managed Care – PPO

## 2022-12-06 ENCOUNTER — Emergency Department (HOSPITAL_BASED_OUTPATIENT_CLINIC_OR_DEPARTMENT_OTHER)
Admission: EM | Admit: 2022-12-06 | Discharge: 2022-12-06 | Disposition: A | Discharge status: Home / Self Care | Payer: BC Managed Care – PPO | Attending: Emergency Medicine | Admitting: Emergency Medicine

## 2022-12-06 ENCOUNTER — Encounter (HOSPITAL_BASED_OUTPATIENT_CLINIC_OR_DEPARTMENT_OTHER): Payer: Self-pay | Admitting: Emergency Medicine

## 2022-12-06 DIAGNOSIS — M79651 Pain in right thigh: Secondary | ICD-10-CM | POA: Diagnosis not present

## 2022-12-06 DIAGNOSIS — Z79899 Other long term (current) drug therapy: Secondary | ICD-10-CM | POA: Insufficient documentation

## 2022-12-06 DIAGNOSIS — M7631 Iliotibial band syndrome, right leg: Secondary | ICD-10-CM

## 2022-12-06 DIAGNOSIS — M7121 Synovial cyst of popliteal space [Baker], right knee: Secondary | ICD-10-CM

## 2022-12-06 DIAGNOSIS — M79604 Pain in right leg: Secondary | ICD-10-CM | POA: Diagnosis present

## 2022-12-06 DIAGNOSIS — K5090 Crohn's disease, unspecified, without complications: Secondary | ICD-10-CM | POA: Diagnosis not present

## 2022-12-06 MED ORDER — LIDOCAINE 5 % EX PTCH
1.0000 | MEDICATED_PATCH | CUTANEOUS | Status: DC
  Administered 2022-12-06: 1 via TRANSDERMAL
  Filled 2022-12-06: qty 1

## 2022-12-06 MED ORDER — HYDROCODONE-ACETAMINOPHEN 5-325 MG PO TABS
1.0000 | ORAL_TABLET | Freq: Once | ORAL | Status: AC
  Administered 2022-12-06: 1 via ORAL
  Filled 2022-12-06: qty 1

## 2022-12-06 NOTE — ED Notes (Addendum)
Assessment: patient describes "bubble like pain" in RLE on outer thigh.  Patient rates pain 9/10, denies swelling.  Leg is warm to touch with no obvious redness. Patient dressed into gown and given warm blanket for comfort.  Patient instructed to use call bell if assistance is needed.

## 2022-12-06 NOTE — Discharge Instructions (Signed)
Please follow-up with your primary care provider in regards to recent symptoms and ER visit.  Today your physical exam shows you may have IT band syndrome and ultrasound shows you may have a Baker's cyst which should resolve with knee sleeve and Tylenol every 6 hours along with rest over the next few days.  I have attached IT band rehab instructions for you.  Please rest the next 2 days and so change or worsen please return to ER.

## 2022-12-06 NOTE — ED Triage Notes (Signed)
Pt c/o RLE pain x 1 wk, no injury; pain starts just below knee and goes to hip; denies recent travel

## 2022-12-06 NOTE — ED Provider Notes (Signed)
[place] [facility] Provider Note   CSN: [id number] Arrival date & time: 12/06/22  1523     History  Chief Complaint  Patient presents with   Leg Pain    Andrea Wilkins is a 57 y.o. female history of Crohn's disease scented with right lateral thigh pain that is been present for the past 6 days.  Patient unsure what caused that she woke up 1 day with it.  Patient denies any leg swelling, recent travel, previous blood clots, chest pain, shortness of breath.  Tylenol has not been helping at home.  Patient denies any rigorous activities or laying on her side that may have caused her any trauma to her thigh.  Patient is been able to walk without difficulty.  Patient denies any fevers.  Testing patient's lateral right thigh does seem to exacerbate the pain.  Patient denies any falls.   Home Medications Prior to Admission medications   Medication Sig Start Date End Date Taking? Authorizing Provider  albuterol (VENTOLIN HFA) 108 (90 Base) MCG/ACT inhaler Inhale 2 puffs into the lungs every 4 (four) hours as needed for shortness of breath or wheezing. 10/03/20   Hetty Blend, FNP  benzonatate (TESSALON) 100 MG capsule Take 1 capsule (100 mg total) by mouth 2 (two) times daily as needed for cough. 08/26/22   Marcine Matar, MD  cyclobenzaprine (FLEXERIL) 10 MG tablet Take 1 tablet (10 mg total) by mouth 2 (two) times daily as needed for muscle spasms. 05/24/22   Eleonore Chiquito, FNP  hydrochlorothiazide (HYDRODIURIL) 25 MG tablet TAKE 1 TABLET (25 MG TOTAL) BY MOUTH DAILY. 07/14/22   Marcine Matar, MD  losartan (COZAAR) 100 MG tablet Take 1 tablet (100 mg total) by mouth daily. 11/12/21   Marcine Matar, MD  meloxicam (MOBIC) 15 MG tablet Take 15 mg by mouth daily. 11/02/19   [provider]  rosuvastatin (CRESTOR) 40 MG tablet TAKE 1 TABLET BY MOUTH EVERY DAY 09/02/22   Marcine Matar, MD  verapamil (VERELAN) 180 MG 24 hr capsule TAKE 1  CAPSULE BY MOUTH EVERY DAY AT BEDTIME NEED OFFICE VISIT 05/24/22   Eleonore Chiquito, FNP      Allergies    Lisinopril, Shellfish allergy, Valsartan, and Benadryl allergy  [diphenhydramine hcl (sleep)]    Review of Systems   Review of Systems  Physical Exam Updated Vital Signs BP (!) 141/91   Pulse 76   Temp 97.9 F (36.6 C)   Resp 18   Ht 5\' 3"  (1.6 m)   Wt 108 kg   SpO2 97%   BMI 42.16 kg/m  Physical Exam Constitutional:      General: She is not in acute distress. Cardiovascular:     Rate and Rhythm: Normal rate and regular rhythm.     Pulses: Normal pulses.     Heart sounds: Normal heart sounds.  Pulmonary:     Effort: Pulmonary effort is normal. No respiratory distress.     Breath sounds: Normal breath sounds.  Musculoskeletal:     Comments: Right thigh: Right IT band is tender to palpation, 5 out of 5 right-sided hip flexion/extension, knee extension/flexion Right knee: Negative anterior/posterior drawer test, varus/valgus stress test No bony abnormalities palpated or tenderness Compartment soft No edema noted is right leg appears seems as left leg Pain not out of proportion Pelvis stable  Skin:    General: Skin is warm and dry.     Capillary Refill: Capillary refill takes less  than 2 seconds.  Neurological:     Mental Status: She is alert.     Comments: Sensation intact distally     ED Results / Procedures / Treatments   Labs (all labs ordered are listed, but only abnormal results are displayed) Labs Reviewed - No data to display  EKG None  Radiology No results found.  Procedures Procedures    Medications Ordered in ED Medications  lidocaine (LIDODERM) 5 % 1 patch (has no administration in time range)  HYDROcodone-acetaminophen (NORCO/VICODIN) 5-325 MG per tablet 1 tablet (1 tablet Oral Given 12/06/22 2015)    ED Course/ Medical Decision Making/ A&P                                 Medical Decision Making Risk Prescription drug  management.   Andrea Wilkins 57 y.o. presented today for right leg pain. Working DDx that I considered at this time includes, but not limited to, Baker's cyst, IT band syndrome, contusion, strain/sprain, fracture, dislocation, neurovascular compromise, septic joint, ischemic limb, compartment syndrome.  R/o DDx: contusion, strain/sprain, fracture, dislocation, neurovascular compromise, septic joint, ischemic limb, compartment syndrome: These are considered less likely due to history of present illness, physical exam, labs/imaging findings.  Review of prior external notes: 11/16/2022 office visit  Unique Tests and My Interpretation:  Right lower extremity ultrasound: Baker's cyst  Social Determinants of Health: none  Discussion with Independent Historian:  Husband  Discussion of Management of Tests: None  Risk: Low: based on diagnostic testing/clinical impression and treatment plan  Risk Stratification Score: None  Plan: On exam patient was no acute distress stable vitals.  On exam patient has the tenderness to her right IT band which would explain her symptoms.  Ultrasound was ordered to rule out any blood clots as she states the pain is go from her right lateral thigh down to her mid calf which ultimately shows a Baker's cyst.  I spoke to the patient about these findings and to use Tylenol every 6 hours needed for pain and use a knee sleeve for the Baker's cyst and will attach rehab exercises she can do for IT band but otherwise did take Tylenol every 6 hours and to rest for the next few days and to follow-up with her primary care provider.  Patient's right lower extremity was neuro vas intact and not showing any concerning features that necessitate further workup at this time and is safe to be discharged with primary care follow-up.  Patient was given return precautions. Patient stable for discharge at this time.  Patient verbalized understanding of plan.  This chart was dictated  using voice recognition software.  Despite best efforts to proofread,  errors can occur which can change the documentation meaning.         Final Clinical Impression(s) / ED Diagnoses Final diagnoses:  None    Rx / DC Orders ED Discharge Orders     None         Remi Deter 12/06/22 2232    Glyn Ade, MD 12/06/22 2316

## 2022-12-06 NOTE — ED Notes (Signed)
Patient requesting pain meds, EDP made aware.

## 2022-12-30 ENCOUNTER — Ambulatory Visit: Payer: BC Managed Care – PPO | Admitting: Internal Medicine

## 2023-01-11 ENCOUNTER — Other Ambulatory Visit: Payer: Self-pay | Admitting: Internal Medicine

## 2023-02-10 ENCOUNTER — Other Ambulatory Visit: Payer: Self-pay | Admitting: Internal Medicine

## 2023-02-10 DIAGNOSIS — E782 Mixed hyperlipidemia: Secondary | ICD-10-CM

## 2023-02-10 DIAGNOSIS — I10 Essential (primary) hypertension: Secondary | ICD-10-CM

## 2023-02-18 ENCOUNTER — Other Ambulatory Visit: Payer: Self-pay | Admitting: Internal Medicine

## 2023-02-22 ENCOUNTER — Other Ambulatory Visit: Payer: Self-pay | Admitting: Internal Medicine

## 2023-02-23 NOTE — Telephone Encounter (Signed)
Called patient to schedule appt for medication refills. No answer. LVMTCB [phone].

## 2023-02-23 NOTE — Telephone Encounter (Signed)
Requested medication (s) are due for refill today: yes  Requested medication (s) are on the active medication list: yes  Last refill:  01/12/23 #30 0 refills  Future visit scheduled: no   Notes to clinic:  last labs 01/28/22. Do you want to give another courtesy refill? Called patient to schedule appt , no answer, LVMTCB     Requested Prescriptions  Pending Prescriptions Disp Refills   losartan (COZAAR) 100 MG tablet [Pharmacy Med Name: LOSARTAN POTASSIUM 100 MG TAB] 30 tablet 0    Sig: Take 1 tablet (100 mg total) by mouth daily. Must have office visit for refills     Cardiovascular:  Angiotensin Receptor Blockers Failed - 02/23/2023  3:59 PM      Failed - Cr in normal range and within 180 days    Creat  Date Value Ref Range Status  03/14/2016 0.87 0.50 - 1.10 mg/dL Final   Creatinine, Ser  Date Value Ref Range Status  01/28/2022 0.70 0.44 - 1.00 mg/dL Final         Failed - K in normal range and within 180 days    Potassium  Date Value Ref Range Status  01/28/2022 4.2 3.5 - 5.1 mmol/L Final         Failed - Last BP in normal range    BP Readings from Last 1 Encounters:  12/06/22 (!) 144/93         Failed - Valid encounter within last 6 months    Recent Outpatient Visits           6 months ago Acute respiratory infection   [facility] [place]. [facility] Marcine Matar, MD   9 months ago Hospital discharge follow-up   [facility] [place]. [facility] Eleonore Chiquito, FNP   1 year ago OSA (obstructive sleep apnea)   [facility] [place]. [facility] Marcine Matar, MD   1 year ago Essential hypertension   [facility] [place]. [facility] Marcine Matar, MD   2 years ago Essential hypertension   [facility] . [facility] Marcine Matar, MD              Passed - Patient is not pregnant

## 2023-03-02 ENCOUNTER — Other Ambulatory Visit: Payer: Self-pay | Admitting: Internal Medicine

## 2023-03-02 DIAGNOSIS — E782 Mixed hyperlipidemia: Secondary | ICD-10-CM

## 2023-03-03 ENCOUNTER — Ambulatory Visit: Payer: BC Managed Care – PPO | Admitting: Family

## 2023-03-03 VITALS — BP 138/81 | HR 87 | Temp 98.7°F | Ht 63.0 in | Wt 242.0 lb

## 2023-03-03 DIAGNOSIS — E785 Hyperlipidemia, unspecified: Secondary | ICD-10-CM | POA: Diagnosis not present

## 2023-03-03 DIAGNOSIS — I10 Essential (primary) hypertension: Secondary | ICD-10-CM

## 2023-03-03 MED ORDER — ROSUVASTATIN CALCIUM 40 MG PO TABS: 40.0000 mg | ORAL_TABLET | Freq: Every day | ORAL | 0 refills | Status: DC

## 2023-03-03 MED ORDER — VERAPAMIL HCL ER 180 MG PO CP24: ORAL_CAPSULE | ORAL | 0 refills | Status: DC

## 2023-03-03 MED ORDER — HYDROCHLOROTHIAZIDE 25 MG PO TABS: 25.0000 mg | ORAL_TABLET | Freq: Every day | ORAL | 0 refills | Status: AC

## 2023-03-03 MED ORDER — LOSARTAN POTASSIUM 100 MG PO TABS: 100.0000 mg | ORAL_TABLET | Freq: Every day | ORAL | 0 refills | Status: DC

## 2023-03-03 NOTE — Progress Notes (Signed)
 Patient ID: Andrea Wilkins, female    DOB: [birth year]  MRN: [id number]  CC: Medication Refills   Subjective: Andrea Wilkins is a 58 y.o. female who presents for medication refills.   Her concerns today include:  - Doing well on Losartan , Hydrochlorothiazide , and Verapamil , no issues/concerns. She does not complain of red flag symptoms such as but not limited to chest pain, shortness of breath, worst headache of life, nausea/vomiting.  - Doing well on Rosuvastatin , no issues/concerns.   Patient Active Problem List   Diagnosis Date Noted   OSA on CPAP 08/26/2022   Elevated coronary artery calcium  score 03/18/2021   Vasovagal syncope 02/08/2021   Mild intermittent asthma without complication 10/15/2020   Chronic urticaria 10/03/2020   Exertional dyspnea 10/03/2020   Normocytic anemia 06/14/2020   COVID-19 vaccine series completed 06/14/2020   Hypertensive urgency 05/25/2020   Mixed hyperlipidemia 05/25/2020   Exertional chest pain 05/24/2020   Psoriatic arthritis (HCC) 08/03/2019   Extensor tenosynovitis of left wrist 06/13/2016   Ganglion cyst of wrist, left 06/13/2016   Class 3 severe obesity due to excess calories with serious comorbidity and body mass index (BMI) of 40.0 to 44.9 in adult [facility]) 03/14/2016   Left leg pain 06/08/2015   Intertrigo 04/02/2015   Scaly patch rash 04/02/2015   Macular erythematous rash 04/02/2015   Tinea corporis 03/05/2015   Tinea pedis 03/05/2015   Insomnia 03/05/2015   Abnormal CT scan, head 12/15/2014   Chiari malformation type I (HCC) 12/15/2014   Occipital headache 12/15/2014   Radicular pain in left arm 12/15/2014   IDA (iron deficiency anemia) 10/27/2013   Rheumatoid arthritis (HCC) 10/27/2013   Crohn's disease (HCC) 10/23/2012   Childhood asthma 10/23/2012   Tension headache 06/14/2012   Essential hypertension 06/14/2012     Current Outpatient Medications on File Prior to Visit  Medication Sig Dispense Refill    albuterol  (VENTOLIN  HFA) 108 (90 Base) MCG/ACT inhaler Inhale 2 puffs into the lungs every 4 (four) hours as needed for shortness of breath or wheezing. 1 each 1   meloxicam  (MOBIC ) 15 MG tablet Take 15 mg by mouth daily.     benzonatate  (TESSALON ) 100 MG capsule Take 1 capsule (100 mg total) by mouth 2 (two) times daily as needed for cough. (Patient not taking: Reported on 03/03/2023) 20 capsule 0   cyclobenzaprine  (FLEXERIL ) 10 MG tablet Take 1 tablet (10 mg total) by mouth 2 (two) times daily as needed for muscle spasms. (Patient not taking: Reported on 03/03/2023) 20 tablet 0   No current facility-administered medications on file prior to visit.    Allergies  Allergen Reactions   Lisinopril  Cough   Shellfish Allergy    Valsartan  Hives   Benadryl  Allergy  [Diphenhydramine  Hcl (Sleep)] Cough    Social History   Socioeconomic History   Marital status: Married    Spouse name: Not on file   Number of children: 2   Years of education: Not on file   Highest education level: Not on file  Occupational History   Not on file  Tobacco Use   Smoking status: Never   Smokeless tobacco: Never  Vaping Use   Vaping status: Never Used  Substance and Sexual Activity   Alcohol use: No   Drug use: No   Sexual activity: Yes    Birth control/protection: Surgical  Other Topics Concern   Not on file  Social History Narrative   Not on file   Social Drivers of Health  Financial Resource Strain: Not on file  Food Insecurity: Not on file  Transportation Needs: Not on file  Physical Activity: Not on file  Stress: Not on file  Social Connections: Unknown (06/10/2021)   Received from [facility]   Social Network    Social Network: Not on file  Intimate Partner Violence: Unknown (05/01/2021)   Received from [employer], [facility]   HITS    Physically Hurt: Not on file    Insult or Talk Down To: Not on file    Threaten Physical Harm: Not on file    Scream or Curse: Not on  file    Family History  Problem Relation Age of Onset   Migraines Mother    Hypertension Mother    Diabetes Father    Migraines Sister    Diabetes Sister    Hypertension Sister     Past Surgical History:  Procedure Laterality Date   BREAST SURGERY  01/27/2005   CARPAL TUNNEL RELEASE     CESAREAN SECTION  01/27/1986   SMALL INTESTINE SURGERY  01/27/2006   TOTAL ABDOMINAL HYSTERECTOMY  01/27/2006    ROS: Review of Systems Negative except as stated above  PHYSICAL EXAM: BP 138/81   Pulse 87   Temp 98.7 F (37.1 C) (Oral)   Ht 5' 3 (1.6 m)   Wt 242 lb (109.8 kg)   SpO2 94%   BMI 42.87 kg/m   Physical Exam HENT:     Head: Normocephalic and atraumatic.     Nose: Nose normal.     Mouth/Throat:     Mouth: Mucous membranes are moist.     Pharynx: Oropharynx is clear.  Eyes:     Extraocular Movements: Extraocular movements intact.     Conjunctiva/sclera: Conjunctivae normal.     Pupils: Pupils are equal, round, and reactive to light.  Cardiovascular:     Rate and Rhythm: Normal rate and regular rhythm.     Pulses: Normal pulses.     Heart sounds: Normal heart sounds.  Pulmonary:     Effort: Pulmonary effort is normal.     Breath sounds: Normal breath sounds.  Musculoskeletal:        General: Normal range of motion.     Cervical back: Normal range of motion and neck supple.  Neurological:     General: No focal deficit present.     Mental Status: She is alert and oriented to person, place, and time.  Psychiatric:        Mood and Affect: Mood normal.        Behavior: Behavior normal.    ASSESSMENT AND PLAN: 1. Primary hypertension (Primary) - Continue Verapamil , Losartan , and Hydrochlorothiazide  as prescribed.  - Routine screening.  - Counseled on blood pressure goal of less than 130/80, low-sodium, DASH diet, medication compliance, and 150 minutes of moderate intensity exercise per week as tolerated. Counseled on medication adherence and adverse effects. -  Follow-up with primary provider as scheduled.  - verapamil  (VERELAN ) 180 MG 24 hr capsule; TAKE 1 CAPSULE BY MOUTH EVERY DAY AT BEDTIME.  Dispense: 90 capsule; Refill: 0 - losartan  (COZAAR ) 100 MG tablet; Take 1 tablet (100 mg total) by mouth daily.  Dispense: 90 tablet; Refill: 0 - hydrochlorothiazide  (HYDRODIURIL ) 25 MG tablet; Take 1 tablet (25 mg total) by mouth daily.  Dispense: 90 tablet; Refill: 0 - Basic Metabolic Panel  2. Hyperlipidemia, unspecified hyperlipidemia type - Continue Rosuvastatin  as prescribed. Counseled on medication adherence/adverse effects.  - Routine screening.  -  Follow-up with primary provider as scheduled.  - rosuvastatin  (CRESTOR ) 40 MG tablet; Take 1 tablet (40 mg total) by mouth daily.  Dispense: 90 tablet; Refill: 0 - Lipid panel     Patient was given the opportunity to ask questions.  Patient verbalized understanding of the plan and was able to repeat key elements of the plan. Patient was given clear instructions to go to Emergency Department or return to medical center if symptoms don't improve, worsen, or new problems develop.The patient verbalized understanding.   Orders Placed This Encounter  Procedures   Basic Metabolic Panel   Lipid panel     Requested Prescriptions   Signed Prescriptions Disp Refills   verapamil  (VERELAN ) 180 MG 24 hr capsule 90 capsule 0    Sig: TAKE 1 CAPSULE BY MOUTH EVERY DAY AT BEDTIME.   rosuvastatin  (CRESTOR ) 40 MG tablet 90 tablet 0    Sig: Take 1 tablet (40 mg total) by mouth daily.   losartan  (COZAAR ) 100 MG tablet 90 tablet 0    Sig: Take 1 tablet (100 mg total) by mouth daily.   hydrochlorothiazide  (HYDRODIURIL ) 25 MG tablet 90 tablet 0    Sig: Take 1 tablet (25 mg total) by mouth daily.    Return in about 1 week (around 03/10/2023) for Follow-Up or next available with Barnie Louder, MD .  Greig JINNY Drones, NP

## 2023-03-03 NOTE — Progress Notes (Signed)
Patient asking to be on diet medication.  Amy can you send in medications Verpamil, Rosuvastatin, losartan,hydrochlorothiazide.  Patient stated her regular provider was busy and she got sent here for med refills.

## 2023-03-04 ENCOUNTER — Encounter: Payer: Self-pay | Admitting: Family

## 2023-03-04 LAB — LIPID PANEL
Chol/HDL Ratio: 2.2 {ratio} (ref 0.0–4.4)
Cholesterol, Total: 159 mg/dL (ref 100–199)
HDL: 71 mg/dL (ref >39)
LDL Chol Calc (NIH): 77 mg/dL (ref 0–99)
Triglycerides: 52 mg/dL (ref 0–149)
VLDL Cholesterol Cal: 11 mg/dL (ref 5–40)

## 2023-03-04 LAB — BASIC METABOLIC PANEL
BUN/Creatinine Ratio: 12 (ref 9–23)
BUN: 10 mg/dL (ref 6–24)
CO2: 24 mmol/L (ref 20–29)
Calcium: 9.1 mg/dL (ref 8.7–10.2)
Chloride: 105 mmol/L (ref 96–106)
Creatinine, Ser: 0.84 mg/dL (ref 0.57–1.00)
Glucose: 87 mg/dL (ref 70–99)
Potassium: 4 mmol/L (ref 3.5–5.2)
Sodium: 142 mmol/L (ref 134–144)
eGFR: 82 mL/min/{1.73_m2} (ref >59)

## 2023-03-05 ENCOUNTER — Telehealth: Payer: Self-pay | Admitting: Internal Medicine

## 2023-03-05 NOTE — Telephone Encounter (Signed)
 error

## 2023-04-30 ENCOUNTER — Ambulatory Visit: Payer: BC Managed Care – PPO | Admitting: Family Medicine

## 2023-05-28 ENCOUNTER — Other Ambulatory Visit: Admitting: Family

## 2023-05-28 DIAGNOSIS — E785 Hyperlipidemia, unspecified: Secondary | ICD-10-CM

## 2023-05-28 DIAGNOSIS — I10 Essential (primary) hypertension: Secondary | ICD-10-CM

## 2023-06-05 ENCOUNTER — Other Ambulatory Visit: Payer: Self-pay | Admitting: Family

## 2023-06-05 DIAGNOSIS — I10 Essential (primary) hypertension: Secondary | ICD-10-CM

## 2023-06-06 ENCOUNTER — Telehealth: Payer: Self-pay | Admitting: Internal Medicine

## 2023-06-06 NOTE — Telephone Encounter (Signed)
 Let pt know limited RF sent on verapamil .  She needs a follow-up appointment with me prior to next refill request.

## 2023-06-10 NOTE — Telephone Encounter (Signed)
 FYI: Called & spoke to the patient. Verified name & DOB. Informed of limited refill sent on verapamil  and will need a follow-up appointment with PCP. Patient declined and stated that she is currently in the process of establishing care with a new PCP. No further assistance needed at this time.

## 2023-06-24 ENCOUNTER — Other Ambulatory Visit: Payer: Self-pay | Admitting: Internal Medicine

## 2023-06-24 DIAGNOSIS — E785 Hyperlipidemia, unspecified: Secondary | ICD-10-CM

## 2023-06-24 DIAGNOSIS — I10 Essential (primary) hypertension: Secondary | ICD-10-CM

## 2023-07-10 ENCOUNTER — Other Ambulatory Visit: Payer: Self-pay | Admitting: Internal Medicine

## 2023-07-10 DIAGNOSIS — I10 Essential (primary) hypertension: Secondary | ICD-10-CM

## 2023-07-19 ENCOUNTER — Other Ambulatory Visit: Payer: Self-pay | Admitting: Internal Medicine

## 2023-07-19 DIAGNOSIS — E785 Hyperlipidemia, unspecified: Secondary | ICD-10-CM

## 2023-07-19 DIAGNOSIS — I10 Essential (primary) hypertension: Secondary | ICD-10-CM

## 2023-07-23 ENCOUNTER — Other Ambulatory Visit: Payer: Self-pay | Admitting: Internal Medicine

## 2023-07-23 DIAGNOSIS — E785 Hyperlipidemia, unspecified: Secondary | ICD-10-CM
